# Patient Record
Sex: Male | Born: 1960 | Race: Black or African American | Hispanic: No | Marital: Single | Smoking: Never smoker
Health system: Southern US, Community
[De-identification: ages and names within clinical notes are randomized; demographics above are authoritative.]

## PROBLEM LIST (undated history)

## (undated) DIAGNOSIS — M1990 Unspecified osteoarthritis, unspecified site: Secondary | ICD-10-CM

## (undated) DIAGNOSIS — J3489 Other specified disorders of nose and nasal sinuses: Secondary | ICD-10-CM

## (undated) DIAGNOSIS — G7000 Myasthenia gravis without (acute) exacerbation: Secondary | ICD-10-CM

## (undated) DIAGNOSIS — E785 Hyperlipidemia, unspecified: Secondary | ICD-10-CM

## (undated) DIAGNOSIS — I10 Essential (primary) hypertension: Secondary | ICD-10-CM

## (undated) DIAGNOSIS — A630 Anogenital (venereal) warts: Secondary | ICD-10-CM

## (undated) DIAGNOSIS — Z972 Presence of dental prosthetic device (complete) (partial): Secondary | ICD-10-CM

## (undated) HISTORY — DX: Myasthenia gravis without (acute) exacerbation: G70.00

## (undated) HISTORY — DX: Hyperlipidemia, unspecified: E78.5

---

## 1984-02-14 HISTORY — PX: LUMBAR SPINE SURGERY: SHX701

## 1997-05-14 ENCOUNTER — Encounter: Admission: RE | Admit: 1997-05-14 | Discharge: 1997-08-12 | Payer: Self-pay | Admitting: Orthopedic Surgery

## 1998-04-26 ENCOUNTER — Encounter: Payer: Self-pay | Admitting: Internal Medicine

## 1998-04-26 ENCOUNTER — Ambulatory Visit (HOSPITAL_COMMUNITY): Admission: RE | Admit: 1998-04-26 | Discharge: 1998-04-26 | Payer: Self-pay | Admitting: Internal Medicine

## 1998-10-07 ENCOUNTER — Emergency Department (HOSPITAL_COMMUNITY): Admission: EM | Admit: 1998-10-07 | Discharge: 1998-10-08 | Payer: Self-pay | Admitting: Emergency Medicine

## 2002-10-22 ENCOUNTER — Ambulatory Visit (HOSPITAL_COMMUNITY): Admission: RE | Admit: 2002-10-22 | Discharge: 2002-10-22 | Payer: Self-pay | Admitting: Ophthalmology

## 2002-10-22 ENCOUNTER — Encounter: Payer: Self-pay | Admitting: Ophthalmology

## 2002-10-28 ENCOUNTER — Encounter: Payer: Self-pay | Admitting: Internal Medicine

## 2002-10-28 ENCOUNTER — Ambulatory Visit (HOSPITAL_COMMUNITY): Admission: RE | Admit: 2002-10-28 | Discharge: 2002-10-28 | Payer: Self-pay | Admitting: Internal Medicine

## 2002-11-21 ENCOUNTER — Encounter: Payer: Self-pay | Admitting: Emergency Medicine

## 2002-11-21 ENCOUNTER — Emergency Department (HOSPITAL_COMMUNITY): Admission: EM | Admit: 2002-11-21 | Discharge: 2002-11-21 | Payer: Self-pay | Admitting: Emergency Medicine

## 2003-01-02 ENCOUNTER — Emergency Department (HOSPITAL_COMMUNITY): Admission: EM | Admit: 2003-01-02 | Discharge: 2003-01-03 | Payer: Self-pay | Admitting: Emergency Medicine

## 2004-09-21 ENCOUNTER — Ambulatory Visit: Payer: Self-pay | Admitting: Internal Medicine

## 2005-06-21 ENCOUNTER — Ambulatory Visit: Payer: Self-pay | Admitting: Internal Medicine

## 2006-02-02 ENCOUNTER — Ambulatory Visit: Payer: Self-pay | Admitting: Internal Medicine

## 2006-09-04 ENCOUNTER — Ambulatory Visit: Payer: Self-pay | Admitting: Internal Medicine

## 2006-09-04 DIAGNOSIS — I10 Essential (primary) hypertension: Secondary | ICD-10-CM | POA: Insufficient documentation

## 2006-09-04 DIAGNOSIS — N529 Male erectile dysfunction, unspecified: Secondary | ICD-10-CM | POA: Insufficient documentation

## 2006-09-04 LAB — CONVERTED CEMR LAB: TSH: 1.16 microintl units/mL (ref 0.35–5.50)

## 2006-10-25 ENCOUNTER — Emergency Department (HOSPITAL_COMMUNITY): Admission: EM | Admit: 2006-10-25 | Discharge: 2006-10-25 | Payer: Self-pay | Admitting: Emergency Medicine

## 2006-12-21 ENCOUNTER — Encounter: Payer: Self-pay | Admitting: Internal Medicine

## 2007-03-01 ENCOUNTER — Encounter: Payer: Self-pay | Admitting: Internal Medicine

## 2007-03-08 ENCOUNTER — Encounter: Payer: Self-pay | Admitting: Internal Medicine

## 2007-03-29 ENCOUNTER — Encounter: Payer: Self-pay | Admitting: Internal Medicine

## 2007-05-16 ENCOUNTER — Ambulatory Visit: Payer: Self-pay | Admitting: Internal Medicine

## 2007-05-16 DIAGNOSIS — E0822 Diabetes mellitus due to underlying condition with diabetic chronic kidney disease: Secondary | ICD-10-CM | POA: Insufficient documentation

## 2007-05-16 DIAGNOSIS — E119 Type 2 diabetes mellitus without complications: Secondary | ICD-10-CM | POA: Insufficient documentation

## 2007-05-16 LAB — CONVERTED CEMR LAB
Basophils Absolute: 0 10*3/uL (ref 0.0–0.1)
Bilirubin Urine: NEGATIVE
Bilirubin, Direct: 0.1 mg/dL (ref 0.0–0.3)
Calcium: 9.4 mg/dL (ref 8.4–10.5)
Eosinophils Absolute: 0 10*3/uL (ref 0.0–0.7)
GFR calc Af Amer: 76 mL/min
GFR calc non Af Amer: 63 mL/min
Glucose, Bld: 104 mg/dL — ABNORMAL HIGH (ref 70–99)
HCT: 37.9 % — ABNORMAL LOW (ref 39.0–52.0)
HDL: 99.1 mg/dL (ref >39.0)
Hemoglobin: 12.5 g/dL — ABNORMAL LOW (ref 13.0–17.0)
Ketones, ur: NEGATIVE mg/dL
MCHC: 32.9 g/dL (ref 30.0–36.0)
MCV: 91.8 fL (ref 78.0–100.0)
Microalb Creat Ratio: 36.8 mg/g — ABNORMAL HIGH (ref 0.0–30.0)
Microalb, Ur: 1.9 mg/dL (ref 0.0–1.9)
Monocytes Absolute: 0.3 10*3/uL (ref 0.1–1.0)
Neutro Abs: 5.6 10*3/uL (ref 1.4–7.7)
RDW: 15.1 % — ABNORMAL HIGH (ref 11.5–14.6)
Sodium: 141 meq/L (ref 135–145)
TSH: 0.35 microintl units/mL (ref 0.35–5.50)
Total Bilirubin: 0.7 mg/dL (ref 0.3–1.2)
Total Protein, Urine: NEGATIVE mg/dL
Total Protein: 6.4 g/dL (ref 6.0–8.3)
Triglycerides: 55 mg/dL (ref 0–149)
VLDL: 11 mg/dL (ref 0–40)
pH: 6 (ref 5.0–8.0)

## 2007-05-31 ENCOUNTER — Encounter: Payer: Self-pay | Admitting: Internal Medicine

## 2007-07-26 ENCOUNTER — Encounter: Payer: Self-pay | Admitting: Internal Medicine

## 2007-08-01 ENCOUNTER — Telehealth: Payer: Self-pay | Admitting: Internal Medicine

## 2007-08-20 ENCOUNTER — Encounter: Admission: RE | Admit: 2007-08-20 | Discharge: 2007-08-20 | Payer: Self-pay | Admitting: Internal Medicine

## 2007-08-20 ENCOUNTER — Encounter: Payer: Self-pay | Admitting: Internal Medicine

## 2007-10-08 ENCOUNTER — Encounter: Payer: Self-pay | Admitting: Internal Medicine

## 2007-11-29 ENCOUNTER — Encounter: Payer: Self-pay | Admitting: Internal Medicine

## 2008-01-06 ENCOUNTER — Ambulatory Visit: Payer: Self-pay | Admitting: Internal Medicine

## 2008-01-06 LAB — CONVERTED CEMR LAB
GFR calc Af Amer: 64 mL/min
GFR calc non Af Amer: 53 mL/min
Glucose, Bld: 106 mg/dL — ABNORMAL HIGH (ref 70–99)
LDL Cholesterol: 89 mg/dL (ref 0–99)
Potassium: 4 meq/L (ref 3.5–5.1)
Sodium: 142 meq/L (ref 135–145)
VLDL: 15 mg/dL (ref 0–40)

## 2008-03-13 ENCOUNTER — Encounter: Payer: Self-pay | Admitting: Internal Medicine

## 2008-05-14 ENCOUNTER — Encounter: Payer: Self-pay | Admitting: Internal Medicine

## 2008-06-02 ENCOUNTER — Encounter: Payer: Self-pay | Admitting: Internal Medicine

## 2008-07-02 ENCOUNTER — Ambulatory Visit: Payer: Self-pay | Admitting: Internal Medicine

## 2008-07-02 LAB — CONVERTED CEMR LAB
BUN: 13 mg/dL (ref 6–23)
Bilirubin Urine: NEGATIVE
Bilirubin, Direct: 0.1 mg/dL (ref 0.0–0.3)
CO2: 28 meq/L (ref 19–32)
Chloride: 103 meq/L (ref 96–112)
Cholesterol: 184 mg/dL (ref 0–200)
Creatinine, Ser: 1.4 mg/dL (ref 0.4–1.5)
Eosinophils Absolute: 0.1 10*3/uL (ref 0.0–0.7)
Glucose, Bld: 110 mg/dL — ABNORMAL HIGH (ref 70–99)
Hemoglobin, Urine: NEGATIVE
Hgb A1c MFr Bld: 6.2 % (ref 4.6–6.5)
Ketones, ur: NEGATIVE mg/dL
MCHC: 35.3 g/dL (ref 30.0–36.0)
MCV: 91.2 fL (ref 78.0–100.0)
Microalb Creat Ratio: 3.4 mg/g (ref 0.0–30.0)
Microalb, Ur: 0.2 mg/dL (ref 0.0–1.9)
Monocytes Absolute: 0.5 10*3/uL (ref 0.1–1.0)
Neutrophils Relative %: 39.5 % — ABNORMAL LOW (ref 43.0–77.0)
Nitrite: NEGATIVE
Platelets: 251 10*3/uL (ref 150.0–400.0)
TSH: 0.8 microintl units/mL (ref 0.35–5.50)
Total Bilirubin: 0.6 mg/dL (ref 0.3–1.2)
Total Protein, Urine: NEGATIVE mg/dL
Total Protein: 6.4 g/dL (ref 6.0–8.3)
Triglycerides: 64 mg/dL (ref 0.0–149.0)

## 2008-07-06 ENCOUNTER — Ambulatory Visit: Payer: Self-pay | Admitting: Internal Medicine

## 2008-09-01 ENCOUNTER — Encounter: Payer: Self-pay | Admitting: Internal Medicine

## 2008-12-03 ENCOUNTER — Encounter: Payer: Self-pay | Admitting: Internal Medicine

## 2008-12-11 ENCOUNTER — Encounter: Payer: Self-pay | Admitting: Internal Medicine

## 2009-01-04 ENCOUNTER — Ambulatory Visit: Payer: Self-pay | Admitting: Internal Medicine

## 2009-03-02 ENCOUNTER — Encounter: Payer: Self-pay | Admitting: Internal Medicine

## 2009-03-22 ENCOUNTER — Ambulatory Visit: Payer: Self-pay | Admitting: Internal Medicine

## 2009-03-22 ENCOUNTER — Encounter (INDEPENDENT_AMBULATORY_CARE_PROVIDER_SITE_OTHER): Payer: Self-pay | Admitting: *Deleted

## 2009-03-30 ENCOUNTER — Encounter: Payer: Self-pay | Admitting: Internal Medicine

## 2009-07-05 ENCOUNTER — Ambulatory Visit: Payer: Self-pay | Admitting: Internal Medicine

## 2009-07-05 LAB — CONVERTED CEMR LAB
ALT: 15 units/L (ref 0–53)
AST: 18 units/L (ref 0–37)
Albumin: 4 g/dL (ref 3.5–5.2)
Alkaline Phosphatase: 40 units/L (ref 39–117)
BUN: 19 mg/dL (ref 6–23)
Basophils Relative: 0.6 % (ref 0.0–3.0)
CO2: 31 meq/L (ref 19–32)
Chloride: 105 meq/L (ref 96–112)
Creatinine, Ser: 1.3 mg/dL (ref 0.4–1.5)
Eosinophils Relative: 1.7 % (ref 0.0–5.0)
GFR calc non Af Amer: 77.69 mL/min (ref >60)
Glucose, Bld: 100 mg/dL — ABNORMAL HIGH (ref 70–99)
Hemoglobin: 13.5 g/dL (ref 13.0–17.0)
Ketones, ur: NEGATIVE mg/dL
Leukocytes, UA: NEGATIVE
Lymphocytes Relative: 47.4 % — ABNORMAL HIGH (ref 12.0–46.0)
Microalb, Ur: 1 mg/dL (ref 0.0–1.9)
Monocytes Relative: 9.3 % (ref 3.0–12.0)
Neutro Abs: 1.6 10*3/uL (ref 1.4–7.7)
PSA: 1.12 ng/mL (ref 0.10–4.00)
RBC: 4.21 M/uL — ABNORMAL LOW (ref 4.22–5.81)
Specific Gravity, Urine: >=1.03 (ref 1.000–1.030)
TSH: 0.9 microintl units/mL (ref 0.35–5.50)
Total Protein: 6.5 g/dL (ref 6.0–8.3)
Urobilinogen, UA: 0.2 (ref 0.0–1.0)
VLDL: 15 mg/dL (ref 0.0–40.0)

## 2009-09-09 ENCOUNTER — Encounter: Payer: Self-pay | Admitting: Internal Medicine

## 2009-09-17 ENCOUNTER — Encounter: Payer: Self-pay | Admitting: Internal Medicine

## 2009-11-03 ENCOUNTER — Encounter: Admission: RE | Admit: 2009-11-03 | Discharge: 2009-11-03 | Payer: Self-pay | Admitting: General Surgery

## 2009-11-04 ENCOUNTER — Ambulatory Visit (HOSPITAL_BASED_OUTPATIENT_CLINIC_OR_DEPARTMENT_OTHER): Admission: RE | Admit: 2009-11-04 | Discharge: 2009-11-04 | Payer: Self-pay | Admitting: General Surgery

## 2009-11-04 HISTORY — PX: LASER ABLATION OF CONDYLOMAS: SHX1948

## 2009-11-11 ENCOUNTER — Ambulatory Visit: Payer: Self-pay | Admitting: Internal Medicine

## 2009-11-11 DIAGNOSIS — N259 Disorder resulting from impaired renal tubular function, unspecified: Secondary | ICD-10-CM | POA: Insufficient documentation

## 2009-11-11 LAB — CONVERTED CEMR LAB
BUN: 16 mg/dL (ref 6–23)
GFR calc non Af Amer: 62.01 mL/min (ref >60)
Glucose, Bld: 171 mg/dL — ABNORMAL HIGH (ref 70–99)
Potassium: 4.8 meq/L (ref 3.5–5.1)

## 2009-11-12 ENCOUNTER — Telehealth: Payer: Self-pay | Admitting: Internal Medicine

## 2010-01-26 ENCOUNTER — Encounter: Payer: Self-pay | Admitting: Internal Medicine

## 2010-03-15 NOTE — Letter (Signed)
Summary: [facility]  [facility]   Imported By: Lester O[place]ogan 09/16/2009 10:24:13  _____________________________________________________________________  External Attachment:    Type:   Image     Comment:   External Document

## 2010-03-15 NOTE — Assessment & Plan Note (Signed)
Summary: wax build up--stc   Vital Signs:  Patient profile:   50 year old male Height:      71 inches Weight:      248.25 pounds BMI:     34.75 O2 Sat:      95 % on Room air Temp:     98.8 degrees F oral Pulse rate:   82 / minute BP sitting:   122 / 68  (left arm) Cuff size:   large  Vitals Entered By: Zella Ball Ewing CMA Duncan Dull) (November 11, 2009 2:27 PM)  O2 Flow:  Room air CC: Wax build up in both ears/RE   CC:  Wax build up in both ears/RE.  History of Present Illness: here for acute - c/o 1-2 days acute onset bialt hearing loss, left more than right, wtihout fever, earpain, heacahde, sT, cough adn Pt denies CP, worsening sob, doe, wheezing, orthopnea, pnd, worsening LE edema, palps, dizziness or syncope Pt denies new neuro symptoms such as headache, facial or extremity weakness , but does have ongoing bialt CTS symtpoms persitent and has not yet been able to get wrist splints as recommended previously;  pain now mild to mod, has been better with splints int he past, no weakness, upper arm or neck pain, and does not want surgical evaluation at this time.  Did see neuro at [facility], who ordered CXR for cough adn arranged [facility] which [facility] - [facility]cart results reviewed with pt today - cxr with nad, and cbc, cmet ok except for unexaplianed elev creatinine from previous 1.3 to 1.6, no recetn med changes, and denies GU symtpoms such as dysuria, freq, urgency, hematuria, or flank pain. Cough incidetnly for approx 2 mo, and some improved spontaneously but still persitent ,  non prod, no hemoptysis, and No fever, wt loss, night sweats, loss of appetite or other constitutional symptoms Has occasional reflux mild but daily without dysphagia, n/v, abd pain, bowel change or blood.  Also with mild nasal allergy symptoms persistent without fever, pain.   Preventive Screening-Counseling & Management      Drug Use:  no.    Problems Prior to Update: 1)  Renal Insufficiency   (ICD-588.9) 2)  Cough  (ICD-786.2) 3)  Other Specified Forms of Hearing Loss  (ICD-389.8) 4)  Preventive Health Care  (ICD-V70.0) 5)  Chalazion, Right  (ICD-373.2) 6)  Long-term Use of Steroids  (ICD-V58.65) 7)  Anxiety  (ICD-300.00) 8)  Diabetes Mellitus, Type II  (ICD-250.00) 9)  Preventive Health Care  (ICD-V70.0) 10)  Myasthenia Gravis Without Exacerbation  (ICD-358.00) 11)  Allergic Rhinitis  (ICD-477.9) 12)  Overweight  (ICD-278.02) 13)  Carpal Tunnel Syndrome, Bilateral  (ICD-354.0) 14)  Erectile Dysfunction  (ICD-302.72) 15)  Hypertension  (ICD-401.9)  Medications Prior to Update: 1)  Benicar 20 Mg Tabs (Olmesartan Medoxomil) .Marland Kitchen.. 1po Qd 2)  Imuran 50 Mg  Tabs (Azathioprine) .... Take 3 Tabs By Mouth Once Daily 3)  Prednisone 20 Mg  Tabs (Prednisone) .Marland Kitchen.. 1po Once Daily 4)  Mestinon 60 Mg  Tabs (Pyridostigmine Bromide) .Marland Kitchen.. 1 By Mouth Tid 5)  Hydrochlorothiazide 12.5 Mg  Tabs (Hydrochlorothiazide) .Marland Kitchen.. 1 By Mouth Qd 6)  Oscal W/ Vit D .... 1po Qd 7)  Vitamin A-Beta Carotene 16109 Unit Caps (Beta Carotene) .Marland Kitchen.. 1 By Mouth Qd 8)  Vitamin C 500 Mg Chew (Ascorbic Acid) .Marland Kitchen.. 1 By Mouth Bid 9)  Vitamin E 200 Unit Caps (Vitamin E) .Marland Kitchen.. 1 By Mouth Qd 10)  Azithromycin 250 Mg Tabs (  Azithromycin) .... 2po Qd For 1 Day, Then 1po Qd For 4days, Then Stop 11)  Hydrocodone-Homatropine 5-1.5 Mg/14ml Syrp (Hydrocodone-Homatropine) .Marland Kitchen.. 1 Tsp By Mouth Q 6 Hrs As Needed 12)  Fluticasone Propionate 50 Mcg/act Susp (Fluticasone Propionate) .... 2 Sprayside Once Daily  Current Medications (verified): 1)  Benicar 20 Mg Tabs (Olmesartan Medoxomil) .Marland Kitchen.. 1po Qd 2)  Imuran 50 Mg  Tabs (Azathioprine) .... Take 3 Tabs By Mouth Once Daily 3)  Prednisone 20 Mg  Tabs (Prednisone) .Marland Kitchen.. 1po Once Daily 4)  Mestinon 60 Mg  Tabs (Pyridostigmine Bromide) .Marland Kitchen.. 1 By Mouth Tid 5)  Hydrochlorothiazide 12.5 Mg  Tabs (Hydrochlorothiazide) .Marland Kitchen.. 1 By Mouth Qd 6)  Oscal W/ Vit D .... 1po Qd 7)  Vitamin A-Beta Carotene  60454 Unit Caps (Beta Carotene) .Marland Kitchen.. 1 By Mouth Qd 8)  Vitamin C 500 Mg Chew (Ascorbic Acid) .Marland Kitchen.. 1 By Mouth Bid 9)  Vitamin E 200 Unit Caps (Vitamin E) .Marland Kitchen.. 1 By Mouth Qd 10)  Fluticasone Propionate 50 Mcg/act Susp (Fluticasone Propionate) .... 2 Sprayside Once Daily 11)  Bilateral Wrist Splints - Xtra Large .... 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 Never Smoked Alcohol use-yes - social Divorced Drug use-no Drug Use:  no  Review of Systems       all otherwise negative per pt -    Physical Exam  General:  alert and overweight-appearing.   Head:  normocephalic and atraumatic.   Eyes:  vision grossly intact, pupils equal, and pupils round.   Ears:  R ear normal and L ear normal.  after bilat wax impactions irrigated out Nose:  nasal dischargemucosal pallor and mucosal edema.   Mouth:  pharyngeal erythema and fair dentition.   Neck:  supple and no masses.   Lungs:  normal respiratory effort and normal breath sounds.   Heart:  normal rate and regular rhythm.   Abdomen:  soft, non-tender, and normal bowel sounds.   Msk:  no joint tenderness and no joint swelling.   Extremities:  no edema, no erythema  Neurologic:  cranial nerves II-XII intact and strength normal in all extremities.     Impression & Recommendations:  Problem # 1:  OTHER SPECIFIED FORMS OF HEARING LOSS (ICD-389.8) due to wax - ok for irrigation bilat today, and  improved  Problem # 2:  CARPAL TUNNEL SYNDROME, BILATERAL (ICD-354.0) for bialt wrist splints today, pt to f/u emg/ncs recetnly done (results pending per pt) with  neurology next visit planned  Problem # 3:  COUGH (ICD-786.2) recent cxr neg sept 20;  not taking the flonase, but not really wanting to at this time;  ok for sample dexilant 60 - to take two times a day for now to see if improved over the next wk or so;  also for OTC allegra as needed   Problem # 4:  RENAL INSUFFICIENCY (ICD-588.9)  ? significacne  - indicently with new cr 1.6 , up from 1.15 Jun 2009 in comparison to labs per [facility] neuro done at [facility] and reviewed with pt from echart - to repeat today to  further evaluate , ok to cont benicar at this time as doubtful this is related  Orders: TLB-BMP (Basic Metabolic Panel-BMET) (80048-METABOL)  Complete Medication List: 1)  Benicar 20 Mg Tabs (Olmesartan medoxomil) .Marland Kitchen.. 1po qd 2)  Imuran 50 Mg Tabs (Azathioprine) .... Take 3 tabs by mouth once daily 3)  Prednisone 20 Mg Tabs (Prednisone) .Marland Kitchen.. 1po once daily 4)  Mestinon 60 Mg Tabs (Pyridostigmine bromide) .Marland Kitchen.. 1 by mouth tid 5)  Hydrochlorothiazide 12.5 Mg Tabs (Hydrochlorothiazide) .Marland Kitchen.. 1 by mouth qd 6)  Oscal W/ Vit D  .... 1po qd 7)  Vitamin A-beta Carotene 16109 Unit Caps (Beta carotene) .Marland Kitchen.. 1 by mouth qd 8)  Vitamin C 500 Mg Chew (Ascorbic acid) .Marland Kitchen.. 1 by mouth bid 9)  Vitamin E 200 Unit Caps (Vitamin e) .Marland Kitchen.. 1 by mouth qd 10)  Fluticasone Propionate 50 Mcg/act Susp (Fluticasone propionate) .... 2 sprayside once daily 11)  Bilateral Wrist Splints - Xtra Large  .... Use asd  Patient Instructions: 1)  please use the wrist splints at night and followup symptoms with the neurologist at your next visit 2)  your ears were irrigated today 3)  please try the dexilant 60 mg twice per day until the samples run out to see if this helps the cough;  if it does you may have acid reflux that is irritating the throat and you should take  OTC prilosec 20 mg (1-2 per day) for this;  if it does not seem to help please try OTC allegra to see if allergies could be present and causing the cough;  if your cough persists, you may need to be referred to pulmonary 4)  Please go to the Lab in the basement for your blood and/or urine tests today 5)  Please call the number on the [facility] for results of your testing  6)  Please schedule a follow-up appointment in May 2012 with CPX labs (or sooner if needed) Prescriptions: BILATERAL WRIST SPLINTS - XTRA LARGE use asd  #1pr x 1   Entered and Authorized by:   Corwin Levins MD   Signed by:   Corwin Levins MD on 11/11/2009   Method used:   Print then Give to Patient   RxID:   [id number]

## 2010-03-15 NOTE — Letter (Signed)
Summary: Neurology/[facility]  Neurology/[facility]   Imported By: Sherian Rein 09/29/2009 15:08:19  _____________________________________________________________________  External Attachment:    Type:   Image     Comment:   External Document

## 2010-03-15 NOTE — Progress Notes (Signed)
Summary: RET CALL   Phone Note Call from Patient Call back at Home Phone [phone] Call back at [phone]   Summary of Call: Pt left vm on triage, he said he was returning Robin's call regarding a wrist splint.  Initial call taken by: Lamar Sprinkles, CMA,  November 12, 2009 10:12 AM  Follow-up for Phone Call        called pt informed to pickup wrist splint order at front desk. Follow-up by: Zella Ball Ewing CMA (AAMA),  November 12, 2009 10:30 AM

## 2010-03-15 NOTE — Assessment & Plan Note (Signed)
Summary: 6 MTH FU  STC   Vital Signs:  Patient profile:   50 year old male Height:      71 inches Weight:      240.13 pounds BMI:     33.61 O2 Sat:      96 % on Room air Temp:     99.3 degrees F oral Pulse rate:   69 / minute BP sitting:   110 / 82  (left arm) Cuff size:   large  Vitals Entered ByZella Ball Ewing (Jul 05, 2009 11:12 AM)  O2 Flow:  Room air  CC: 6 month followup/RE   CC:  6 month followup/RE.  History of Present Illness: off metformin  per endo ;  overall doing well, has mild to mod nasal allergy symtpoms with congestion with clear drainage and occasional cough;  Pt denies CP, sob, doe, wheezing, orthopnea, pnd, worsening LE edema, palps, dizziness or syncope   Pt denies new neuro symptoms such as headache, facial or extremity weakness     Problems Prior to Update: 1)  Preventive Health Care  (ICD-V70.0) 2)  Chalazion, Right  (ICD-373.2) 3)  Long-term Use of Steroids  (ICD-V58.65) 4)  Anxiety  (ICD-300.00) 5)  Diabetes Mellitus, Type II  (ICD-250.00) 6)  Preventive Health Care  (ICD-V70.0) 7)  Myasthenia Gravis Without Exacerbation  (ICD-358.00) 8)  Allergic Rhinitis  (ICD-477.9) 9)  Overweight  (ICD-278.02) 10)  Carpal Tunnel Syndrome, Bilateral  (ICD-354.0) 11)  Erectile Dysfunction  (ICD-302.72) 12)  Hypertension  (ICD-401.9)  Medications Prior to Update: 1)  Benicar 20 Mg Tabs (Olmesartan Medoxomil) .Marland Kitchen.. 1po Qd 2)  Imuran 50 Mg  Tabs (Azathioprine) .... Take 3 Tabs By Mouth Once Daily 3)  Metformin Hcl 500 Mg Tabs (Metformin Hcl) .Marland Kitchen.. 1 By Mouth Once Daily 4)  Prednisone 20 Mg  Tabs (Prednisone) .Marland Kitchen.. 1po Once Daily 5)  Mestinon 60 Mg  Tabs (Pyridostigmine Bromide) .Marland Kitchen.. 1 By Mouth Tid 6)  Hydrochlorothiazide 12.5 Mg  Tabs (Hydrochlorothiazide) .Marland Kitchen.. 1 By Mouth Qd 7)  Oscal W/ Vit D .... 1po Qd 8)  Vitamin A-Beta Carotene 16109 Unit Caps (Beta Carotene) .Marland Kitchen.. 1 By Mouth Qd 9)  Vitamin C 500 Mg Chew (Ascorbic Acid) .Marland Kitchen.. 1 By Mouth Bid 10)  Vitamin E 200  Unit Caps (Vitamin E) .Marland Kitchen.. 1 By Mouth Qd 11)  Azithromycin 250 Mg Tabs (Azithromycin) .... 2po Qd For 1 Day, Then 1po Qd For 4days, Then Stop 12)  Hydrocodone-Homatropine 5-1.5 Mg/62ml Syrp (Hydrocodone-Homatropine) .Marland Kitchen.. 1 Tsp By Mouth Q 6 Hrs As Needed  Current Medications (verified): 1)  Benicar 20 Mg Tabs (Olmesartan Medoxomil) .Marland Kitchen.. 1po Qd 2)  Imuran 50 Mg  Tabs (Azathioprine) .... Take 3 Tabs By Mouth Once Daily 3)  Prednisone 20 Mg  Tabs (Prednisone) .Marland Kitchen.. 1po Once Daily 4)  Mestinon 60 Mg  Tabs (Pyridostigmine Bromide) .Marland Kitchen.. 1 By Mouth Tid 5)  Hydrochlorothiazide 12.5 Mg  Tabs (Hydrochlorothiazide) .Marland Kitchen.. 1 By Mouth Qd 6)  Oscal W/ Vit D .... 1po Qd 7)  Vitamin A-Beta Carotene 60454 Unit Caps (Beta Carotene) .Marland Kitchen.. 1 By Mouth Qd 8)  Vitamin C 500 Mg Chew (Ascorbic Acid) .Marland Kitchen.. 1 By Mouth Bid 9)  Vitamin E 200 Unit Caps (Vitamin E) .Marland Kitchen.. 1 By Mouth Qd 10)  Azithromycin 250 Mg Tabs (Azithromycin) .... 2po Qd For 1 Day, Then 1po Qd For 4days, Then Stop 11)  Hydrocodone-Homatropine 5-1.5 Mg/73ml Syrp (Hydrocodone-Homatropine) .Marland Kitchen.. 1 Tsp By Mouth Q 6 Hrs As Needed 12)  Fluticasone Propionate 50  Mcg/act Susp (Fluticasone Propionate) .... 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 brother with ruptured aneurysm at 60 yo mother and sister with DM sister with pancreatic cancer HTN brother died with sepsis at 50 07-17-06  Review of Systems  The patient denies anorexia, fever, vision loss, decreased hearing, hoarseness, chest pain, syncope, dyspnea on exertion, peripheral edema, prolonged cough, headaches, hemoptysis, abdominal pain, melena, hematochezia, severe indigestion/heartburn, hematuria, muscle weakness, suspicious skin lesions, difficulty walking, depression, unusual weight change, abnormal bleeding, enlarged lymph nodes, and angioedema.         all otherwise negative per pt -    Physical Exam  General:  alert and overweight-appearing.   Head:  normocephalic and atraumatic.   Eyes:  vision grossly intact, pupils equal, and pupils round.   Ears:  R ear normal and L ear normal.   Nose:  nasal dischargemucosal pallor and mucosal edema.   Mouth:  no gingival abnormalities and pharynx pink and moist.   Neck:  supple and no masses.   Lungs:  normal respiratory effort and normal breath sounds.   Heart:  normal rate and regular rhythm.   Abdomen:  soft, non-tender, and normal bowel sounds.   Msk:  no joint tenderness and no joint swelling.   Extremities:  no edema, no erythema  Neurologic:  cranial nerves II-XII intact and strength normal in all extremities.   Skin:  color normal and no rashes.   Psych:  not anxious appearing and not depressed appearing.     Impression & Recommendations:  Problem # 1:  Preventive Health Care (ICD-V70.0)  Overall doing well, age appropriate education and counseling updated and referral for appropriate preventive services done unless declined, immunizations up to date or declined, diet counseling done if overweight, urged to quit smoking if smokes , most recent labs reviewed and current ordered if appropriate, ecg reviewed or declined (interpretation per ECG scanned in the EMR if done); information regarding Medicare  Prevention requirements given if appropriate; speciality referrals updated as appropriate   Orders: Gastroenterology Referral (GI) TLB-BMP (Basic Metabolic Panel-BMET) (80048-METABOL) TLB-CBC Platelet - w/Differential (85025-CBCD) TLB-Hepatic/Liver Function Pnl (80076-HEPATIC) TLB-Lipid Panel (80061-LIPID) TLB-TSH (Thyroid Stimulating Hormone) (84443-TSH) TLB-PSA (Prostate Specific Antigen) (84153-PSA) TLB-Udip ONLY (81003-UDIP)  Problem # 2:  ALLERGIC RHINITIS (ICD-477.9)  His updated medication list for this problem includes:    Fluticasone Propionate 50 Mcg/act Susp (Fluticasone propionate) .Marland Kitchen... 2 sprayside once daily  Discussed use of allergy medications and environmental measures.  treat as above, f/u any worsening signs or symptoms   Problem # 3:  DIABETES MELLITUS, TYPE II (ICD-250.00)  The following medications were removed from the medication list:    Metformin Hcl 500 Mg Tabs (Metformin hcl) .Marland Kitchen... 1 by mouth once daily His updated medication list for this problem includes:    Benicar 20 Mg Tabs (Olmesartan medoxomil) .Marland Kitchen... 1po qd  Labs Reviewed: Creat: 1.4 (07/02/2008)  Reviewed HgBA1c results: 6.2 (07/02/2008)  6.4 (01/06/2008) recently off metformin due to good sugars per endo - Pt to cont DM diet, excercise, wt loss efforts; to check labs today   Orders: TLB-A1C / Hgb A1C (Glycohemoglobin) (83036-A1C) TLB-Microalbumin/Creat Ratio, Urine (82043-MALB)  Problem # 4:  HYPERTENSION (ICD-401.9)  His updated medication list for this problem includes:    Benicar 20 Mg Tabs (Olmesartan medoxomil) .Marland Kitchen... 1po qd    Hydrochlorothiazide 12.5 Mg Tabs (Hydrochlorothiazide) .Marland Kitchen... 1 by mouth qd  BP today: 110/82 Prior BP: 126/72 (03/22/2009)  Labs Reviewed: K+: 3.8 (07/02/2008) Creat: : 1.4 (07/02/2008)   Chol: 184 (07/02/2008)   HDL: 56.00 (07/02/2008)   LDL: 115 (07/02/2008)   TG: 64.0 (07/02/2008) stable overall by hx and exam, ok to continue meds/tx as is    Complete Medication List: 1)  Benicar 20 Mg Tabs (Olmesartan medoxomil) .Marland Kitchen.. 1po qd 2)  Imuran 50 Mg Tabs (Azathioprine) .... Take 3 tabs by mouth once daily 3)  Prednisone 20 Mg Tabs (Prednisone) .Marland Kitchen.. 1po once daily 4)  Mestinon 60 Mg Tabs (Pyridostigmine bromide) .Marland Kitchen.. 1 by mouth tid 5)  Hydrochlorothiazide 12.5 Mg Tabs (Hydrochlorothiazide) .Marland Kitchen.. 1 by mouth qd 6)  Oscal W/ Vit D  .... 1po qd 7)  Vitamin A-beta Carotene 98119 Unit Caps (Beta carotene) .Marland Kitchen.. 1 by mouth qd 8)  Vitamin C 500 Mg Chew (Ascorbic acid) .Marland Kitchen.. 1 by mouth bid 9)  Vitamin E 200 Unit Caps (Vitamin e) .Marland Kitchen.. 1 by mouth qd 10)  Azithromycin 250 Mg Tabs (Azithromycin) .... 2po qd for 1 day, then 1po qd for 4days, then stop 11)  Hydrocodone-homatropine 5-1.5 Mg/88ml Syrp (Hydrocodone-homatropine) .Marland Kitchen.. 1 tsp by mouth q 6 hrs as needed 12)  Fluticasone Propionate 50 Mcg/act Susp (Fluticasone propionate) .... 2 sprayside once daily  Patient Instructions: 1)  Please take all new medications as prescribed - the nasal spray 2)  Continue all previous medications as before this visit  3)  Please go to the Lab in the basement for your blood and/or urine tests today  4)  You will be contacted about the referral(s) to: colonoscopy 5)  Please schedule a follow-up appointment in 1 year or sooner if needed Prescriptions: FLUTICASONE PROPIONATE 50 MCG/ACT SUSP (FLUTICASONE PROPIONATE) 2 sprayside once daily  #1 x 11   Entered and Authorized by:   Corwin Levins MD   Signed by:   Corwin Levins MD on 07/05/2009   Method used:   Print then Give to Patient   RxID:   [id number]

## 2010-03-15 NOTE — Letter (Signed)
Summary: CMN for compression garments/[facility]  CMN for compression garments/[facility]   Imported By: Sherian Rein 03/03/2009 11:28:16  _____________________________________________________________________  External Attachment:    Type:   Image     Comment:   External Document

## 2010-03-15 NOTE — Letter (Signed)
Summary: Out of Work  [facility]  [address] [place], [place] [address]   Phone: [phone]  Fax: [phone]    March 22, 2009   Employee:  Miguel Powell    To Whom It May Concern:   For Medical reasons, please excuse the above named employee from work for the following dates:  Start:   03/22/2009  End:   03/22/2009  If you need additional information, please feel free to contact our office.         Sincerely,    Robin Ewing

## 2010-03-15 NOTE — Letter (Signed)
Summary: Neurology/[facility]  Neurology/[facility]   Imported By: Sherian Rein 04/08/2009 09:49:51  _____________________________________________________________________  External Attachment:    Type:   Image     Comment:   External Document

## 2010-03-15 NOTE — Assessment & Plan Note (Signed)
Summary: PER Miguel Powell--COLD SYMPTOMS PER PT D/T--STC   Vital Signs:  Patient profile:   50 year old male Height:      71 inches Weight:      234 pounds BMI:     32.75 O2 Sat:      98 % on Room air Temp:     98 degrees F oral Pulse rate:   72 / minute BP sitting:   126 / 72  (left arm) Cuff size:   regular  Vitals Entered ByZella Ball Ewing (March 22, 2009 10:40 AM)  O2 Flow:  Room air CC: chest congestion and cough/RE   CC:  chest congestion and cough/RE.  History of Present Illness: here with 3 days onset severe ST, prod cough greenish sputum, with general weakness and malaise, but Pt denies CP, sob, doe, wheezing, orthopnea, pnd, worsening LE edema, palps, dizziness or syncope   Pt denies new neuro symptoms such as headache, facial or extremity weakness   Pt denies polydipsia, polyuria, or low sugar symptoms such as shakiness improved with eating.  Overall good compliance with meds, trying to follow low chol, DM diet, wt stable, little excercise however   Problems Prior to Update: 1)  Preventive Health Care  (ICD-V70.0) 2)  Chalazion, Right  (ICD-373.2) 3)  Long-term Use of Steroids  (ICD-V58.65) 4)  Anxiety  (ICD-300.00) 5)  Diabetes Mellitus, Type II  (ICD-250.00) 6)  Preventive Health Care  (ICD-V70.0) 7)  Myasthenia Gravis Without Exacerbation  (ICD-358.00) 8)  Allergic Rhinitis  (ICD-477.9) 9)  Overweight  (ICD-278.02) 10)  Carpal Tunnel Syndrome, Bilateral  (ICD-354.0) 11)  Erectile Dysfunction  (ICD-302.72) 12)  Hypertension  (ICD-401.9)  Medications Prior to Update: 1)  Benicar 20 Mg Tabs (Olmesartan Medoxomil) .Marland Kitchen.. 1po Qd 2)  Imuran 50 Mg  Tabs (Azathioprine) .... Take 3 Tabs By Mouth Once Daily 3)  Metformin Hcl 500 Mg Tabs (Metformin Hcl) .Marland Kitchen.. 1 By Mouth Once Daily 4)  Prednisone 20 Mg  Tabs (Prednisone) .Marland Kitchen.. 1po Once Daily 5)  Mestinon 60 Mg  Tabs (Pyridostigmine Bromide) .Marland Kitchen.. 1 By Mouth Tid 6)  Hydrochlorothiazide 12.5 Mg  Tabs (Hydrochlorothiazide)  .Marland Kitchen.. 1 By Mouth Qd 7)  Oscal W/ Vit D .... 1po Qd 8)  Vitamin A-Beta Carotene 04540 Unit Caps (Beta Carotene) .Marland Kitchen.. 1 By Mouth Qd 9)  Vitamin C 500 Mg Chew (Ascorbic Acid) .Marland Kitchen.. 1 By Mouth Bid 10)  Vitamin E 200 Unit Caps (Vitamin E) .Marland Kitchen.. 1 By Mouth Qd  Current Medications (verified): 1)  Benicar 20 Mg Tabs (Olmesartan Medoxomil) .Marland Kitchen.. 1po Qd 2)  Imuran 50 Mg  Tabs (Azathioprine) .... Take 3 Tabs By Mouth Once Daily 3)  Metformin Hcl 500 Mg Tabs (Metformin Hcl) .Marland Kitchen.. 1 By Mouth Once Daily 4)  Prednisone 20 Mg  Tabs (Prednisone) .Marland Kitchen.. 1po Once Daily 5)  Mestinon 60 Mg  Tabs (Pyridostigmine Bromide) .Marland Kitchen.. 1 By Mouth Tid 6)  Hydrochlorothiazide 12.5 Mg  Tabs (Hydrochlorothiazide) .Marland Kitchen.. 1 By Mouth Qd 7)  Oscal W/ Vit D .... 1po Qd 8)  Vitamin A-Beta Carotene 98119 Unit Caps (Beta Carotene) .Marland Kitchen.. 1 By Mouth Qd 9)  Vitamin C 500 Mg Chew (Ascorbic Acid) .Marland Kitchen.. 1 By Mouth Bid 10)  Vitamin E 200 Unit Caps (Vitamin E) .Marland Kitchen.. 1 By Mouth Qd 11)  Azithromycin 250 Mg Tabs (Azithromycin) .... 2po Qd For 1 Day, Then 1po Qd For 4days, Then Stop 12)  Hydrocodone-Homatropine 5-1.5 Mg/89ml Syrp (Hydrocodone-Homatropine) .Marland Kitchen.. 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   Head:  normocephalic and atraumatic.   Eyes:  vision grossly intact, pupils equal, and pupils round.   Ears:  bilat tm's red, sinus nontender Nose:  nasal  dischargemucosal pallor and mucosal erythema.   Mouth:  pharyngeal erythema and fair dentition.   Neck:  supple and cervical lymphadenopathy.   Lungs:  normal respiratory effort and normal breath sounds.   Heart:  normal rate and regular rhythm.   Extremities:  no edema, no erythema    Impression & Recommendations:  Problem # 1:  BRONCHITIS-ACUTE (ICD-466.0)  His updated medication list for this problem includes:    Azithromycin 250 Mg Tabs (Azithromycin) .Marland Kitchen... 2po qd for 1 day, then 1po qd for 4days, then stop    Hydrocodone-homatropine 5-1.5 Mg/18ml Syrp (Hydrocodone-homatropine) .Marland Kitchen... 1 tsp by mouth q 6 hrs as needed treat as above, f/u any worsening signs or symptoms   Problem # 2:  DIABETES MELLITUS, TYPE II (ICD-250.00)  His updated medication list for this problem includes:    Benicar 20 Mg Tabs (Olmesartan medoxomil) .Marland Kitchen... 1po qd    Metformin Hcl 500 Mg Tabs (Metformin hcl) .Marland Kitchen... 1 by mouth once daily stable overall by hx and exam, ok to continue meds/tx as is   Labs Reviewed: Creat: 1.4 (07/02/2008)    Reviewed HgBA1c results: 6.2 (07/02/2008)  6.4 (01/06/2008)  Problem # 3:  HYPERTENSION (ICD-401.9)  His updated medication list for this problem includes:    Benicar 20 Mg Tabs (Olmesartan medoxomil) .Marland Kitchen... 1po qd    Hydrochlorothiazide 12.5 Mg Tabs (Hydrochlorothiazide) .Marland Kitchen... 1 by mouth qd  BP today: 126/72 Prior BP: 128/98 (01/04/2009)  Labs Reviewed: K+: 3.8 (07/02/2008) Creat: : 1.4 (07/02/2008)   Chol: 184 (07/02/2008)   HDL: 56.00 (07/02/2008)   LDL: 115 (07/02/2008)   TG: 64.0 (07/02/2008) stable overall by hx and exam, ok to continue meds/tx as is   Complete Medication List: 1)  Benicar 20 Mg Tabs (Olmesartan medoxomil) .Marland Kitchen.. 1po qd 2)  Imuran 50 Mg Tabs (Azathioprine) .... Take 3 tabs by mouth once daily 3)  Metformin Hcl 500 Mg Tabs (Metformin hcl) .Marland Kitchen.. 1 by mouth once daily 4)  Prednisone 20 Mg Tabs (Prednisone) .Marland Kitchen.. 1po once daily 5)  Mestinon 60 Mg  Tabs (Pyridostigmine bromide) .Marland Kitchen.. 1 by mouth tid 6)  Hydrochlorothiazide 12.5 Mg Tabs (Hydrochlorothiazide) .Marland Kitchen.. 1 by mouth qd 7)  Oscal W/ Vit D  .... 1po qd 8)  Vitamin A-beta Carotene 87564 Unit Caps (Beta carotene) .Marland Kitchen.. 1 by mouth qd 9)  Vitamin C 500 Mg Chew (Ascorbic acid) .Marland Kitchen.. 1 by mouth bid 10)  Vitamin E 200 Unit Caps (Vitamin e) .Marland Kitchen.. 1 by mouth qd 11)  Azithromycin 250 Mg Tabs (Azithromycin) .... 2po qd for 1 day, then 1po qd for 4days, then stop 12)  Hydrocodone-homatropine 5-1.5 Mg/62ml Syrp (Hydrocodone-homatropine) .Marland Kitchen.. 1 tsp by mouth q 6 hrs as needed  Patient Instructions: 1)  Please take all new medications as prescribed 2)  Continue all previous  medications as before this visit  3)  you are given the work note today 4)  You can also use Mucinex D OTC or it's generic for congestion (called Mucous Free at [facility]) 5)  Please schedule a follow-up appointment as needed. Prescriptions: HYDROCODONE-HOMATROPINE 5-1.5 MG/5ML SYRP (HYDROCODONE-HOMATROPINE) 1 tsp by mouth q 6 hrs as needed  #6 oz x 1   Entered and Authorized by:   Corwin Levins MD   Signed by:   Corwin Levins MD on 03/22/2009   Method used:   Print then Give to Patient   RxID:   [id number] AZITHROMYCIN 250 MG TABS (AZITHROMYCIN) 2po qd for 1 day, then 1po qd for 4days, then stop  #6 x 1   Entered and Authorized by:   Corwin Levins MD   Signed by:   Corwin Levins MD on 03/22/2009   Method used:   Print then Give to Patient   RxID:   [id number]

## 2010-03-17 NOTE — Letter (Signed)
Summary: [facility]   Imported By: Sherian Rein 02/03/2010 12:15:29  _____________________________________________________________________  External Attachment:    Type:   Image     Comment:   External Document

## 2010-03-24 ENCOUNTER — Ambulatory Visit: Payer: Self-pay | Admitting: Family Medicine

## 2010-04-04 ENCOUNTER — Encounter: Payer: Self-pay | Admitting: Internal Medicine

## 2010-04-12 ENCOUNTER — Encounter: Payer: Self-pay | Admitting: Family Medicine

## 2010-04-12 ENCOUNTER — Encounter (INDEPENDENT_AMBULATORY_CARE_PROVIDER_SITE_OTHER): Payer: BC Managed Care – PPO | Admitting: Family Medicine

## 2010-04-12 ENCOUNTER — Encounter: Payer: BC Managed Care – PPO | Admitting: Family Medicine

## 2010-04-12 DIAGNOSIS — I10 Essential (primary) hypertension: Secondary | ICD-10-CM

## 2010-04-12 DIAGNOSIS — Z79899 Other long term (current) drug therapy: Secondary | ICD-10-CM

## 2010-04-12 DIAGNOSIS — Z0000 Encounter for general adult medical examination without abnormal findings: Secondary | ICD-10-CM

## 2010-04-12 DIAGNOSIS — E559 Vitamin D deficiency, unspecified: Secondary | ICD-10-CM

## 2010-04-15 ENCOUNTER — Telehealth: Payer: Self-pay | Admitting: Internal Medicine

## 2010-04-21 NOTE — Progress Notes (Signed)
Summary: Needs new rx  Phone Note Call from Patient Call back at Home Phone [phone]   Summary of Call: Patient is requesting a call back regarding wrist splint. He picked up recent rx but needs new rx for XL wrist splint "with steel"  Initial call taken by: Lamar Sprinkles, CMA,  April 15, 2010 1:58 PM  Follow-up for Phone Call        I think he can go to [facility] on [address] to see about this Follow-up by: Corwin Levins MD,  April 15, 2010 4:27 PM  Additional Follow-up for Phone Call Additional follow up Details #1::        Pt advised of above via home VM  Additional Follow-up by: Margaret Pyle, CMA,  April 15, 2010 4:43 PM

## 2010-04-28 LAB — DIFFERENTIAL
Lymphs Abs: 1.8 10*3/uL (ref 0.7–4.0)
Monocytes Relative: 9 % (ref 3–12)
Neutro Abs: 2.8 10*3/uL (ref 1.7–7.7)
Neutrophils Relative %: 54 % (ref 43–77)

## 2010-04-28 LAB — CBC
HCT: 38.4 % — ABNORMAL LOW (ref 39.0–52.0)
MCH: 31.8 pg (ref 26.0–34.0)
MCHC: 34.9 g/dL (ref 30.0–36.0)
MCV: 91.2 fL (ref 78.0–100.0)
RDW: 13.1 % (ref 11.5–15.5)

## 2010-04-28 LAB — COMPREHENSIVE METABOLIC PANEL
BUN: 11 mg/dL (ref 6–23)
Calcium: 9.4 mg/dL (ref 8.4–10.5)
Creatinine, Ser: 1.62 mg/dL — ABNORMAL HIGH (ref 0.4–1.5)
Glucose, Bld: 104 mg/dL — ABNORMAL HIGH (ref 70–99)
Sodium: 142 mEq/L (ref 135–145)
Total Protein: 6.7 g/dL (ref 6.0–8.3)

## 2010-05-11 ENCOUNTER — Ambulatory Visit: Payer: BC Managed Care – PPO | Admitting: Internal Medicine

## 2010-05-11 ENCOUNTER — Encounter: Payer: Self-pay | Admitting: Internal Medicine

## 2010-05-11 DIAGNOSIS — R05 Cough: Secondary | ICD-10-CM

## 2010-05-11 DIAGNOSIS — E119 Type 2 diabetes mellitus without complications: Secondary | ICD-10-CM

## 2010-05-11 DIAGNOSIS — I10 Essential (primary) hypertension: Secondary | ICD-10-CM

## 2010-05-11 DIAGNOSIS — F411 Generalized anxiety disorder: Secondary | ICD-10-CM

## 2010-05-17 NOTE — Assessment & Plan Note (Signed)
Summary: cough   Vital Signs:  Patient profile:   50 year old male Height:      71 inches Weight:      253 pounds BMI:     35.41 Temp:     97.8 degrees F 99oral Pulse rate:   68 / minute BP sitting:   160 / 78  (left arm) Cuff size:   large  Vitals Entered By: Zella Ball Ewing CMA Duncan Dull) (May 11, 2010 11:58 AM)  O2 Flow:  Room air CC: Cough due to medication/RE   CC:  Cough due to medication/RE.  History of Present Illness: here to f/u - overall doing ok, but with persistent chronic cough, and he is concerned his current meds may be related;  Pt denies CP, worsening sob, doe, wheezing, orthopnea, pnd, worsening LE edema, palps, dizziness or syncope  Pt denies new neuro symptoms such as headache, facial or extremity weakness  Pt denies polydipsia, polyuria, or low sugar symptoms such as shakiness improved with eating.  Overall good compliance with meds, trying to follow low chol, DM diet, wt overall up 13 lbs since sept 2011 (though has lost 10 lbs in the past month per pt), little excercise however  CBG's at home in low 100's.  Wt had gone up due to being out of work for 5 months getting skin lesions removed per surgury - dr Abbey Chatters, now resolved.  Also he has been able to decrease the prednisone to 10 mg every other day since sept 2011, and as of last wk also with decreased imuran as well.  Overall good compliance with meds, and good tolerability.  No fever, wt loss, night sweats, loss of appetite or other constitutional symptoms   Problems Prior to Update: 1)  Renal Insufficiency  (ICD-588.9) 2)  Cough  (ICD-786.2) 3)  Other Specified Forms of Hearing Loss  (ICD-389.8) 4)  Preventive Health Care  (ICD-V70.0) 5)  Chalazion, Right  (ICD-373.2) 6)  Long-term Use of Steroids  (ICD-V58.65) 7)  Anxiety  (ICD-300.00) 8)  Diabetes Mellitus, Type II  (ICD-250.00) 9)  Preventive Health Care  (ICD-V70.0) 10)  Myasthenia Gravis Without Exacerbation  (ICD-358.00) 11)  Allergic Rhinitis   (ICD-477.9) 12)  Overweight  (ICD-278.02) 13)  Carpal Tunnel Syndrome, Bilateral  (ICD-354.0) 14)  Erectile Dysfunction  (ICD-302.72) 15)  Hypertension  (ICD-401.9)  Medications Prior to Update: 1)  Benicar 20 Mg Tabs (Olmesartan Medoxomil) .Marland Kitchen.. 1po Qd 2)  Imuran 50 Mg  Tabs (Azathioprine) .... Take 3 Tabs By Mouth Once Daily 3)  Prednisone 20 Mg  Tabs (Prednisone) .Marland Kitchen.. 1po Once Daily 4)  Mestinon 60 Mg  Tabs (Pyridostigmine Bromide) .Marland Kitchen.. 1 By Mouth Tid 5)  Hydrochlorothiazide 12.5 Mg  Tabs (Hydrochlorothiazide) .Marland Kitchen.. 1 By Mouth Qd 6)  Oscal W/ Vit D .... 1po Qd 7)  Vitamin A-Beta Carotene 16109 Unit Caps (Beta Carotene) .Marland Kitchen.. 1 By Mouth Qd 8)  Vitamin C 500 Mg Chew (Ascorbic Acid) .Marland Kitchen.. 1 By Mouth Bid 9)  Vitamin E 200 Unit Caps (Vitamin E) .Marland Kitchen.. 1 By Mouth Qd 10)  Fluticasone Propionate 50 Mcg/act Susp (Fluticasone Propionate) .... 2 Sprayside Once Daily 11)  Bilateral Wrist Splints - Xtra Large .... Use Asd  Current Medications (verified): 1)  Benicar 20 Mg Tabs (Olmesartan Medoxomil) .Marland Kitchen.. 1po Qd 2)  Imuran 50 Mg  Tabs (Azathioprine) .Marland Kitchen.. 1 and 1/2 By Mouth Once Daily 3)  Prednisone 10 Mg Tabs (Prednisone) .Marland Kitchen.. 1 By Mouth Every Other Day 4)  Mestinon 60 Mg  Tabs (Pyridostigmine  Bromide) .Marland Kitchen.. 1 By Mouth Tid 5)  Hydrochlorothiazide 12.5 Mg  Tabs (Hydrochlorothiazide) .Marland Kitchen.. 1 By Mouth Qd 6)  Oscal W/ Vit D .... 1po Qd 7)  Vitamin A-Beta Carotene 16109 Unit Caps (Beta Carotene) .Marland Kitchen.. 1 By Mouth Qd 8)  Vitamin C 500 Mg Chew (Ascorbic Acid) .Marland Kitchen.. 1 By Mouth Bid 9)  Vitamin E 200 Unit Caps (Vitamin E) .Marland Kitchen.. 1 By Mouth Qd 10)  Fluticasone Propionate 50 Mcg/act Susp (Fluticasone Propionate) .... 2 Sprayside Once Daily 11)  Pantoprazole Sodium 40 Mg Tbec (Pantoprazole Sodium) .Marland Kitchen.. 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 , not ill appearing Head:  normocephalic and atraumatic.   Eyes:  vision grossly intact, pupils equal, and pupils round.   Ears:  R ear normal and L ear normal.   Nose:  nasal dischargemucosal pallor and mucosal edema.   Mouth:  no gingival abnormalities and pharynx pink and moist.   Neck:  supple and no masses.   Lungs:  normal respiratory effort and normal breath sounds.   Heart:  normal rate and regular rhythm.   Abdomen:  soft, non-tender, and normal bowel sounds.   Msk:  no joint tenderness and no joint swelling.   Extremities:  no edema, no erythema  Neurologic:  cranial nerves II-XII intact and strength normal in all extremities.     Impression & Recommendations:  Problem # 1:  COUGH (ICD-786.2)  chronic, d/w pt   - not due to meds, did have presumed neg cxr sept Jun 28, 2009 per neurology, adn on chronic prednisone which make allergy/asthma very unlikely as cause;  has also seen ent and pulm as well;  given that most likely is reflux subclinical - for protonix 40 once daily, f/u next visit  Orders: No Charge Patient Arrived (NCPA0) (NCPA0)  Problem # 2:  DIABETES MELLITUS, TYPE II (ICD-250.00)  His updated medication list for this problem includes:    Benicar 20 Mg Tabs (Olmesartan medoxomil) .Marland Kitchen... 1po qd  Labs Reviewed: Creat: 1.5 (11/11/2009)    Reviewed HgBA1c  results: 6.1 (07/05/2009)  6.2 (07/02/2008) stable overall by hx and exam, ok to continue meds/tx as is , for f/u labs next visit  Problem # 3:  HYPERTENSION (ICD-401.9)  His updated medication list for this problem includes:    Benicar 20 Mg Tabs (Olmesartan medoxomil) .Marland Kitchen... 1po qd    Hydrochlorothiazide 12.5 Mg Tabs (Hydrochlorothiazide) .Marland Kitchen... 1 by mouth qd  BP today: 160/78 Prior BP: 122/68 (11/11/2009)  Labs Reviewed: K+: 4.8 (11/11/2009) Creat: : 1.5 (11/11/2009)   Chol: 177 (07/05/2009)   HDL: 71.80 (07/05/2009)   LDL: 90 (07/05/2009)   TG: 75.0 (07/05/2009) mild elev today, likely situational, ok to follow, continue same treatment , to cont f/u with BP at home as he does  Problem #  4:  ANXIETY (ICD-300.00)  Discussed medication use and relaxation techniques.  stable overall by hx and exam, ok to continue meds/tx as is   Complete Medication List: 1)  Benicar 20 Mg Tabs (Olmesartan medoxomil) .Marland Kitchen.. 1po qd 2)  Imuran 50 Mg Tabs (Azathioprine) .Marland Kitchen.. 1 and 1/2 by mouth once daily 3)  Prednisone 10 Mg Tabs (Prednisone) .Marland Kitchen.. 1 by mouth every other day 4)  Mestinon 60 Mg Tabs (Pyridostigmine bromide) .Marland Kitchen.. 1 by mouth tid 5)  Hydrochlorothiazide 12.5 Mg Tabs (Hydrochlorothiazide) .Marland Kitchen.. 1 by mouth qd 6)  Oscal W/ Vit D  .... 1po qd 7)  Vitamin A-beta Carotene 47829 Unit Caps (Beta carotene) .Marland Kitchen.. 1 by mouth qd 8)  Vitamin C 500 Mg Chew (Ascorbic acid) .Marland Kitchen.. 1 by mouth bid 9)  Vitamin E 200 Unit Caps (Vitamin e) .Marland Kitchen.. 1 by mouth qd 10)  Fluticasone Propionate 50 Mcg/act Susp (Fluticasone propionate) .... 2 sprayside once daily 11)  Pantoprazole Sodium 40 Mg Tbec (Pantoprazole sodium) .Marland Kitchen.. 1 by mouth once daily  Patient Instructions: 1)  Please take all new medications as prescribed  - the generic protonix 2)  Continue all previous medications as before this visit 3)  The office will call to have you followup in May 2012 with labs before Prescriptions: PANTOPRAZOLE SODIUM 40 MG TBEC  (PANTOPRAZOLE SODIUM) 1 by mouth once daily  #30 x 11   Entered and Authorized by:   Corwin Levins MD   Signed by:   Corwin Levins MD on 05/11/2010   Method used:   Print then Give to Patient   RxID:   [id number]    Orders Added: 1)  No Charge Patient Arrived (NCPA0) [NCPA0]

## 2010-06-16 ENCOUNTER — Other Ambulatory Visit: Payer: Self-pay | Admitting: Internal Medicine

## 2010-07-20 ENCOUNTER — Other Ambulatory Visit: Payer: Self-pay | Admitting: Internal Medicine

## 2010-11-25 LAB — CBC
Hemoglobin: 14.3
MCHC: 34.4
MCV: 88.1
RBC: 4.71

## 2010-11-25 LAB — DIFFERENTIAL
Basophils Relative: 0
Eosinophils Absolute: 0
Lymphs Abs: 0.8
Monocytes Absolute: 0 — ABNORMAL LOW
Monocytes Relative: 1 — ABNORMAL LOW
Neutro Abs: 3.8

## 2010-11-25 LAB — I-STAT 8, (EC8 V) (CONVERTED LAB)
BUN: 11
Chloride: 104
HCT: 46
Operator id: 146091
Potassium: 3.8
pCO2, Ven: 39.7 — ABNORMAL LOW

## 2010-11-25 LAB — POCT CARDIAC MARKERS
Myoglobin, poc: 63.1
Operator id: 146091

## 2011-01-04 ENCOUNTER — Other Ambulatory Visit: Payer: Self-pay | Admitting: Allergy

## 2011-01-04 ENCOUNTER — Ambulatory Visit
Admission: RE | Admit: 2011-01-04 | Discharge: 2011-01-04 | Disposition: A | Discharge status: Home / Self Care | Payer: BC Managed Care – PPO | Source: Ambulatory Visit | Attending: Allergy | Admitting: Allergy

## 2011-01-04 DIAGNOSIS — R05 Cough: Secondary | ICD-10-CM

## 2011-04-28 DIAGNOSIS — H02402 Unspecified ptosis of left eyelid: Secondary | ICD-10-CM | POA: Insufficient documentation

## 2011-08-03 ENCOUNTER — Other Ambulatory Visit: Payer: Self-pay | Admitting: Internal Medicine

## 2011-09-06 ENCOUNTER — Other Ambulatory Visit: Payer: Self-pay | Admitting: Internal Medicine

## 2011-09-12 ENCOUNTER — Other Ambulatory Visit: Payer: Self-pay | Admitting: Internal Medicine

## 2011-09-22 ENCOUNTER — Other Ambulatory Visit: Payer: Self-pay | Admitting: Internal Medicine

## 2011-09-22 MED ORDER — HYDROCHLOROTHIAZIDE 12.5 MG PO CAPS: 12.5000 mg | ORAL_CAPSULE | Freq: Every day | ORAL | Status: DC

## 2011-09-22 NOTE — Telephone Encounter (Signed)
Called the patient informed he has not been seen since 3/12 and had informed pharmacy overdue for OV.  Informed the patient he would need to schedule office visit and would give him #30 until OV.  Patient agreed to all.

## 2011-10-05 ENCOUNTER — Ambulatory Visit: Payer: BC Managed Care – PPO | Admitting: Internal Medicine

## 2011-10-05 DIAGNOSIS — Z0289 Encounter for other administrative examinations: Secondary | ICD-10-CM

## 2011-10-27 ENCOUNTER — Other Ambulatory Visit: Payer: Self-pay | Admitting: Internal Medicine

## 2011-10-30 ENCOUNTER — Encounter: Payer: Self-pay | Admitting: Internal Medicine

## 2011-10-30 ENCOUNTER — Ambulatory Visit (INDEPENDENT_AMBULATORY_CARE_PROVIDER_SITE_OTHER): Payer: BC Managed Care – PPO | Admitting: Internal Medicine

## 2011-10-30 VITALS — BP 112/70 | HR 78 | Temp 97.5°F | Ht 72.0 in | Wt 243.4 lb

## 2011-10-30 DIAGNOSIS — H6120 Impacted cerumen, unspecified ear: Secondary | ICD-10-CM

## 2011-10-30 DIAGNOSIS — G7000 Myasthenia gravis without (acute) exacerbation: Secondary | ICD-10-CM | POA: Insufficient documentation

## 2011-10-30 DIAGNOSIS — Z23 Encounter for immunization: Secondary | ICD-10-CM

## 2011-10-30 DIAGNOSIS — Z0000 Encounter for general adult medical examination without abnormal findings: Secondary | ICD-10-CM

## 2011-10-30 DIAGNOSIS — J309 Allergic rhinitis, unspecified: Secondary | ICD-10-CM

## 2011-10-30 DIAGNOSIS — E119 Type 2 diabetes mellitus without complications: Secondary | ICD-10-CM

## 2011-10-30 MED ORDER — HYDROCHLOROTHIAZIDE 12.5 MG PO CAPS: 12.5000 mg | ORAL_CAPSULE | ORAL | Status: DC

## 2011-10-30 MED ORDER — FEXOFENADINE HCL 180 MG PO TABS: 180.0000 mg | ORAL_TABLET | Freq: Every day | ORAL | Status: DC

## 2011-10-30 MED ORDER — OLMESARTAN MEDOXOMIL 20 MG PO TABS: 20.0000 mg | ORAL_TABLET | Freq: Every day | ORAL | Status: DC

## 2011-10-30 NOTE — Assessment & Plan Note (Signed)
/  Mild to mod, for allegra prn,  to f/u any worsening symptoms or concerns 

## 2011-10-30 NOTE — Assessment & Plan Note (Signed)
Will need to obtain [facility] labs, Continue all other medications as before,  to f/u any worsening symptoms or concerns

## 2011-10-30 NOTE — Patient Instructions (Addendum)
Your ears were irrigated today of wax Take all new medications as prescribed - the allegra Continue all other medications as before Your refills were done as reqeuested today We will call Dr Meredith Mody of Neurosurgury at [facility] for your lab results Please return in 6 mo with Lab testing done 3-5 days before

## 2011-10-30 NOTE — Progress Notes (Signed)
Subjective:    Patient ID: Miguel Powell, male    DOB: 09-May-1960, 51 y.o.   MRN: [id number]  HPI  Here for wellness and f/u;  Overall doing ok;  Pt denies CP, worsening SOB, DOE, wheezing, orthopnea, PND, worsening LE edema, palpitations, dizziness or syncope.  Pt denies neurological change such as new Headache, facial or extremity weakness.  Pt denies polydipsia, polyuria, or low sugar symptoms. Pt states overall good compliance with treatment and medications, good tolerability, and trying to follow lower cholesterol diet.  Pt denies worsening depressive symptoms, suicidal ideation or panic. No fever, wt loss, night sweats, loss of appetite, or other constitutional symptoms.  Pt states good ability with ADL's, low fall risk, home safety reviewed and adequate, no significant changes in hearing or vision, and occasionally active with exercise.  Had recent labs including blood sugar at [facility], has bilat ear hearing loss, mild in the past wk - ? Wax.  Does have several wks ongoing nasal allergy symptoms with clear congestion, itch and sneeze, without fever, pain.  Has mild bilat CTS symptoms apparently diagnosed with NCS per chiropracter but declines tx. Past Medical History  Diagnosis Date  . ANXIETY 05/16/2007  . ERECTILE DYSFUNCTION 09/04/2006  . CARPAL TUNNEL SYNDROME, BILATERAL 09/04/2006  . Myasthenia gravis without exacerbation 05/16/2007  . CHALAZION, RIGHT 01/06/2008  . HYPERTENSION 09/04/2006  . ALLERGIC RHINITIS 09/04/2006  . RENAL INSUFFICIENCY 11/11/2009  . Myasthenia gravis   . DIABETES MELLITUS, TYPE II 05/16/2007   Past Surgical History  Procedure Date  . Lumbar surgury 1987    reports that he has never smoked. He does not have any smokeless tobacco history on file. He reports that he drinks alcohol. He reports that he does not use illicit drugs. family history includes Aneurysm (age of onset:33) in his brother; Cancer in his sister; Diabetes in his mother and sister; and Hypertension in  his sister. No Known Allergies Current Outpatient Prescriptions on File Prior to Visit  Medication Sig Dispense Refill  . azaTHIOprine (IMURAN) 50 MG tablet Take 50 mg by mouth 3 (three) times daily.        Marland Kitchen BENICAR 20 MG tablet TAKE ONE TABLET BY MOUTH EVERY DAY  30 each  11  . predniSONE (DELTASONE) 20 MG tablet Take 20 mg by mouth daily.        Marland Kitchen pyridostigmine (MESTINON) 60 MG tablet Take 60 mg by mouth 3 (three) times daily.        Marland Kitchen DISCONTD: hydrochlorothiazide (MICROZIDE) 12.5 MG capsule TAKE ONE CAPSULE BY MOUTH EVERY DAY  30 capsule  3  . Ascorbic Acid (VITAMIN C) 500 MG tablet Take 500 mg by mouth 2 (two) times daily.        . beta carotene (BL BETA CAROTENE) 40981 UNIT capsule Take 25,000 Units by mouth daily.        . calcium-vitamin D (OSCAL 500/200 D-3) 500-200 MG-UNIT per tablet Take 1 tablet by mouth daily.        . fexofenadine (ALLEGRA) 180 MG tablet Take 1 tablet (180 mg total) by mouth daily.  90 tablet  3  . fluticasone (FLONASE) 50 MCG/ACT nasal spray 2 sprays by Nasal route daily.        . vitamin E 200 UNIT capsule Take 200 Units by mouth daily.         Review of Systems Review of Systems  Constitutional: Negative for diaphoresis, activity change, appetite change and unexpected weight change.  HENT: Negative for hearing loss,  ear pain, facial swelling, mouth sores and neck stiffness.   Eyes: Negative for pain, redness and visual disturbance.  Respiratory: Negative for shortness of breath and wheezing.   Cardiovascular: Negative for chest pain and palpitations.  Gastrointestinal: Negative for diarrhea, blood in stool, abdominal distention and rectal pain.  Genitourinary: Negative for hematuria, flank pain and decreased urine volume.  Musculoskeletal: Negative for myalgias and joint swelling.  Skin: Negative for color change and wound.  Neurological: Negative for syncope and numbness. except for the above Hematological: Negative for adenopathy.    Psychiatric/Behavioral: Negative for hallucinations, self-injury, decreased concentration and agitation.     Objective:   Physical Exam BP 112/70  Pulse 78  Temp 97.5 F (36.4 C) (Oral)  Ht 6' (1.829 m)  Wt 243 lb 6 oz (110.394 kg)  BMI 33.01 kg/m2  SpO2 97% Physical Exam  VS noted Constitutional: Pt is oriented to person, place, and time. Appears well-developed and well-nourished.  HENT:  Head: Normocephalic and atraumatic.  Right Ear: External ear normal.  Left Ear: External ear normal.  Nose: Nose normal.  Mouth/Throat: Oropharynx is clear and moist. Bilat tm's clear after wax irrigation Eyes: Conjunctivae and EOM are normal. Pupils are equal, round, and reactive to light.  Neck: Normal range of motion. Neck supple. No JVD present. No tracheal deviation present.  Cardiovascular: Normal rate, regular rhythm, normal heart sounds and intact distal pulses.   Pulmonary/Chest: Effort normal and breath sounds normal.  Abdominal: Soft. Bowel sounds are normal. There is no tenderness.  Musculoskeletal: Normal range of motion. Exhibits no edema.  Lymphadenopathy:  Has no cervical adenopathy.  Neurological: Pt is alert and oriented to person, place, and time. Pt has normal reflexes. No cranial nerve deficit.  Skin: Skin is warm and dry. No rash noted.  Psychiatric:  Has  normal mood and affect. Behavior is normal.     Assessment & Plan:

## 2011-10-30 NOTE — Assessment & Plan Note (Signed)
Improved with irrigaiton bilat

## 2011-10-30 NOTE — Assessment & Plan Note (Signed)

## 2011-11-30 ENCOUNTER — Other Ambulatory Visit: Payer: Self-pay

## 2011-11-30 MED ORDER — OLMESARTAN MEDOXOMIL 20 MG PO TABS: 20.0000 mg | ORAL_TABLET | Freq: Every day | ORAL | Status: DC

## 2012-03-26 ENCOUNTER — Other Ambulatory Visit: Payer: Self-pay | Admitting: *Deleted

## 2012-03-26 MED ORDER — HYDROCHLOROTHIAZIDE 12.5 MG PO CAPS: 12.5000 mg | ORAL_CAPSULE | ORAL | Status: DC

## 2012-03-26 NOTE — Telephone Encounter (Signed)
Left msg on triage stating needing refill on his HCTZ. Pls send to [facility]...Raechel Chute

## 2012-03-26 NOTE — Telephone Encounter (Signed)
Called pt no answer LMOM rx sent to [facility]...Raechel Chute

## 2012-04-26 ENCOUNTER — Ambulatory Visit: Payer: BC Managed Care – PPO | Admitting: Internal Medicine

## 2012-04-30 ENCOUNTER — Ambulatory Visit: Payer: BC Managed Care – PPO | Admitting: Internal Medicine

## 2012-05-10 ENCOUNTER — Encounter: Payer: Self-pay | Admitting: Internal Medicine

## 2012-05-10 ENCOUNTER — Telehealth: Payer: Self-pay | Admitting: Internal Medicine

## 2012-05-10 ENCOUNTER — Ambulatory Visit (INDEPENDENT_AMBULATORY_CARE_PROVIDER_SITE_OTHER): Payer: BC Managed Care – PPO | Admitting: Internal Medicine

## 2012-05-10 VITALS — BP 118/80 | HR 63 | Temp 97.6°F | Ht 72.0 in | Wt 240.2 lb

## 2012-05-10 DIAGNOSIS — E119 Type 2 diabetes mellitus without complications: Secondary | ICD-10-CM

## 2012-05-10 DIAGNOSIS — F411 Generalized anxiety disorder: Secondary | ICD-10-CM

## 2012-05-10 DIAGNOSIS — J309 Allergic rhinitis, unspecified: Secondary | ICD-10-CM

## 2012-05-10 DIAGNOSIS — Z0000 Encounter for general adult medical examination without abnormal findings: Secondary | ICD-10-CM

## 2012-05-10 DIAGNOSIS — F528 Other sexual dysfunction not due to a substance or known physiological condition: Secondary | ICD-10-CM

## 2012-05-10 DIAGNOSIS — I10 Essential (primary) hypertension: Secondary | ICD-10-CM

## 2012-05-10 MED ORDER — SILDENAFIL CITRATE 100 MG PO TABS: 50.0000 mg | ORAL_TABLET | Freq: Every day | ORAL | Status: DC | PRN

## 2012-05-10 MED ORDER — FLUTICASONE PROPIONATE 50 MCG/ACT NA SUSP: 2.0000 | Freq: Every day | NASAL | Status: DC

## 2012-05-10 MED ORDER — AMLODIPINE BESYLATE 5 MG PO TABS: 5.0000 mg | ORAL_TABLET | Freq: Every day | ORAL | Status: DC

## 2012-05-10 NOTE — Telephone Encounter (Signed)
Patient was just seen in the office and given a Rx for Viagra and a "Free Card for a few pills".  Patient states he presented the card for the free refills but it had expired on 01/2012.  He was wondering would Dr. Jonny Ruiz have another Free Card.  Advised I would send message to Dr. Jonny Ruiz. Caller states he is now back at the office and is going to try to get another. Understanding expressed.  Please contact patient for "free Rx card for the viagra"  [phone]

## 2012-05-10 NOTE — Progress Notes (Signed)
Subjective:    Patient ID: Miguel Powell, male    DOB: [birth year], 52 y.o.   MRN: [id number]  HPI  Here to f/u; overall doing ok,  Pt denies chest pain, increased sob or doe, wheezing, orthopnea, PND, increased LE swelling, palpitations, dizziness or syncope.  Pt denies polydipsia, polyuria, or low sugar symptoms such as weakness or confusion improved with po intake.  Pt denies new neurological symptoms such as new headache, or facial or extremity weakness or numbness.   Pt states overall good compliance with meds, has been trying to follow lower cholesterol, diabetic diet, with wt overall stable,  but little exercise however.  S/p plasmapheresis for eye involvment/worsening for the myasthenia.  Had physical labs done feb 2014, and last PSA was oct 2013 related to blood draw at work  Has ED symptoms worse in 6 mo, still with AM erections,  Denies worsening depressive symptoms, suicidal ideation, or panic Does have several wks ongoing nasal allergy symptoms with clearish congestion, itch and sneezing, without fever, pain, ST, cough, swelling or wheezing., and not using his flonase recently, though this worked well in the past Had a OSA done oct 2013 elsewhere Past Medical History  Diagnosis Date  . ERECTILE DYSFUNCTION 09/04/2006  . CARPAL TUNNEL SYNDROME, BILATERAL 09/04/2006  . Myasthenia gravis without exacerbation 05/16/2007  . CHALAZION, RIGHT 01/06/2008  . Myasthenia gravis   . ALLERGIC RHINITIS 09/04/2006  . ANXIETY 05/16/2007  . DIABETES MELLITUS, TYPE II 05/16/2007  . HYPERTENSION 09/04/2006  . RENAL INSUFFICIENCY 11/11/2009   Past Surgical History  Procedure Laterality Date  . Lumbar surgury  1987    reports that he has never smoked. He has never used smokeless tobacco. He reports that  drinks alcohol. He reports that he does not use illicit drugs. family history includes Aneurysm (age of onset: 51) in his brother; Cancer in his sister; Diabetes in his mother and sister; and Hypertension in  his sister. No Known Allergies Current Outpatient Prescriptions on File Prior to Visit  Medication Sig Dispense Refill  . Ascorbic Acid (VITAMIN C) 500 MG tablet Take 500 mg by mouth 2 (two) times daily.        Marland Kitchen azaTHIOprine (IMURAN) 50 MG tablet Take 50 mg by mouth 3 (three) times daily.        Marland Kitchen BENICAR 20 MG tablet TAKE ONE TABLET BY MOUTH EVERY DAY  30 each  11  . beta carotene (BL BETA CAROTENE) 40981 UNIT capsule Take 25,000 Units by mouth daily.        . calcium-vitamin D (OSCAL 500/200 D-3) 500-200 MG-UNIT per tablet Take 1 tablet by mouth daily.        . fexofenadine (ALLEGRA) 180 MG tablet Take 1 tablet (180 mg total) by mouth daily.  90 tablet  3  . olmesartan (BENICAR) 20 MG tablet Take 1 tablet (20 mg total) by mouth daily.  90 tablet  3  . predniSONE (DELTASONE) 20 MG tablet Take 20 mg by mouth daily.        Marland Kitchen pyridostigmine (MESTINON) 60 MG tablet Take 60 mg by mouth 3 (three) times daily.        . vitamin E 200 UNIT capsule Take 200 Units by mouth daily.         No current facility-administered medications on file prior to visit.    Review of Systems  Constitutional: Negative for unexpected weight change, or unusual diaphoresis  HENT: Negative for tinnitus.   Eyes: Negative for photophobia and visual  disturbance.  Respiratory: Negative for choking and stridor.   Gastrointestinal: Negative for vomiting and blood in stool.  Genitourinary: Negative for hematuria and decreased urine volume.  Musculoskeletal: Negative for acute joint swelling Skin: Negative for color change and wound.  Neurological: Negative for tremors and numbness other than noted  Psychiatric/Behavioral: Negative for decreased concentration or  hyperactivity.       Objective:   Physical Exam BP 118/80  Pulse 63  Temp(Src) 97.6 F (36.4 C) (Oral)  Ht 6' (1.829 m)  Wt 240 lb 4 oz (108.977 kg)  BMI 32.58 kg/m2  SpO2 99% VS noted, not ill appaering Constitutional: Pt appears well-developed and  well-nourished.  HENT: Head: NCAT.  Right Ear: External ear normal.  Left Ear: External ear normal.  Eyes: Conjunctivae and EOM are normal. Pupils are equal, round, and reactive to light.  Bilat tm's with mild erythema.  Max sinus areas mild tender.  Pharynx with mild erythema, no exudate Neck: Normal range of motion. Neck supple.  Cardiovascular: Normal rate and regular rhythm.   Pulmonary/Chest: Effort normal and breath sounds normal.  Abd:  Soft, NT, non-distended, + BS Neurological: Pt is alert. Not confused  Skin: Skin is warm. No erythema.  Psychiatric: Pt behavior is normal. Thought content normal.     Assessment & Plan:

## 2012-05-10 NOTE — Patient Instructions (Addendum)
Zella Ball will call [facility] in [place] -  [phone] , or [phone] to try to get the PSA as well Please take all new medication as prescribed - the amlodipine at 5 mg per day, and the flonase, and the viagra OK to stop the fluid pill (HCT) to see if this helps Please continue all other medications as before Please have the pharmacy call with any other refills you may need. No further blood work needed today Please return in 6 months, or sooner if needed, with Lab testing done 3-5 days before

## 2012-05-11 NOTE — Assessment & Plan Note (Signed)
stable overall by history and exam, recent data reviewed with pt, and pt to continue medical treatment as before,  to f/u any worsening symptoms or concerns  Last a1c 6.2 per [place] labs in feb 2014

## 2012-05-11 NOTE — Assessment & Plan Note (Signed)
For viagra prn,  to f/u any worsening symptoms or concerns 

## 2012-05-11 NOTE — Assessment & Plan Note (Signed)
Mild to mod, for flonase restart,  to f/u any worsening symptoms or concerns 

## 2012-05-11 NOTE — Assessment & Plan Note (Signed)
stable overall by history and exam, recent data reviewed with pt, and pt to continue medical treatment as before,  to f/u any worsening symptoms or concerns Lab Results  Component Value Date   WBC 5.2 11/02/2009   HGB 13.4 11/02/2009   HCT 38.4* 11/02/2009   PLT 244 11/02/2009   GLUCOSE 171* 11/11/2009   CHOL 177 07/05/2009   TRIG 75.0 07/05/2009   HDL 71.80 07/05/2009   LDLCALC 90 07/05/2009   ALT 17 11/02/2009   AST 26 11/02/2009   NA 141 11/11/2009   K 4.8 11/11/2009   CL 101 11/11/2009   CREATININE 1.5 11/11/2009   BUN 16 11/11/2009   CO2 33* 11/11/2009   TSH 0.90 07/05/2009   PSA 1.12 07/05/2009   HGBA1C 6.1 07/05/2009   MICROALBUR 1.0 07/05/2009

## 2012-05-11 NOTE — Assessment & Plan Note (Addendum)
stable overall by history and exam, recent data reviewed with pt, but pt to change hct to amlod 5 due to ED symtpoms possbily related,  to f/u any worsening symptoms or concerns BP Readings from Last 3 Encounters:  05/10/12 118/80  10/30/11 112/70  05/11/10 160/78

## 2012-05-24 ENCOUNTER — Encounter: Payer: Self-pay | Admitting: Internal Medicine

## 2012-08-10 ENCOUNTER — Other Ambulatory Visit: Payer: Self-pay | Admitting: Internal Medicine

## 2012-11-08 ENCOUNTER — Ambulatory Visit: Payer: BC Managed Care – PPO | Admitting: Internal Medicine

## 2012-11-15 ENCOUNTER — Ambulatory Visit: Payer: BC Managed Care – PPO | Admitting: Internal Medicine

## 2012-11-15 DIAGNOSIS — Z0289 Encounter for other administrative examinations: Secondary | ICD-10-CM

## 2012-12-06 ENCOUNTER — Other Ambulatory Visit: Payer: Self-pay | Admitting: Internal Medicine

## 2012-12-06 NOTE — Telephone Encounter (Signed)
Pt called requesting a refill on BENICAR 20 MG tablet Please advise if this can be done

## 2012-12-06 NOTE — Telephone Encounter (Signed)
Patient informed refill done 

## 2013-02-04 ENCOUNTER — Other Ambulatory Visit: Payer: Self-pay | Admitting: Internal Medicine

## 2013-02-11 ENCOUNTER — Ambulatory Visit (INDEPENDENT_AMBULATORY_CARE_PROVIDER_SITE_OTHER): Payer: BC Managed Care – PPO | Admitting: Internal Medicine

## 2013-02-11 ENCOUNTER — Telehealth: Payer: Self-pay

## 2013-02-11 ENCOUNTER — Other Ambulatory Visit (INDEPENDENT_AMBULATORY_CARE_PROVIDER_SITE_OTHER): Payer: BC Managed Care – PPO

## 2013-02-11 ENCOUNTER — Telehealth: Payer: Self-pay | Admitting: Internal Medicine

## 2013-02-11 ENCOUNTER — Encounter: Payer: Self-pay | Admitting: Internal Medicine

## 2013-02-11 VITALS — BP 120/76 | HR 102 | Temp 98.3°F | Ht 72.0 in | Wt 250.8 lb

## 2013-02-11 DIAGNOSIS — Z0000 Encounter for general adult medical examination without abnormal findings: Secondary | ICD-10-CM

## 2013-02-11 DIAGNOSIS — E119 Type 2 diabetes mellitus without complications: Secondary | ICD-10-CM

## 2013-02-11 DIAGNOSIS — J309 Allergic rhinitis, unspecified: Secondary | ICD-10-CM

## 2013-02-11 DIAGNOSIS — J209 Acute bronchitis, unspecified: Secondary | ICD-10-CM

## 2013-02-11 LAB — CBC WITH DIFFERENTIAL/PLATELET
Basophils Absolute: 0 10*3/uL (ref 0.0–0.1)
Basophils Relative: 0.3 % (ref 0.0–3.0)
Eosinophils Absolute: 0 10*3/uL (ref 0.0–0.7)
Hemoglobin: 15.1 g/dL (ref 13.0–17.0)
Lymphocytes Relative: 23.8 % (ref 12.0–46.0)
Lymphs Abs: 0.9 10*3/uL (ref 0.7–4.0)
MCHC: 34.3 g/dL (ref 30.0–36.0)
Neutro Abs: 2.6 10*3/uL (ref 1.4–7.7)
RBC: 4.79 Mil/uL (ref 4.22–5.81)
WBC: 3.9 10*3/uL — ABNORMAL LOW (ref 4.5–10.5)

## 2013-02-11 LAB — URINALYSIS, ROUTINE W REFLEX MICROSCOPIC
Bilirubin Urine: NEGATIVE
Ketones, ur: NEGATIVE
Specific Gravity, Urine: <=1.005 — AB (ref 1.000–1.030)
Total Protein, Urine: NEGATIVE
Urine Glucose: NEGATIVE
Urobilinogen, UA: 0.2 (ref 0.0–1.0)
WBC, UA: NONE SEEN (ref 0–?)
pH: 6.5 (ref 5.0–8.0)

## 2013-02-11 LAB — BASIC METABOLIC PANEL
BUN: 15 mg/dL (ref 6–23)
CO2: 30 mEq/L (ref 19–32)
Calcium: 9 mg/dL (ref 8.4–10.5)
Chloride: 98 mEq/L (ref 96–112)
Creatinine, Ser: 1.7 mg/dL — ABNORMAL HIGH (ref 0.4–1.5)
Glucose, Bld: 124 mg/dL — ABNORMAL HIGH (ref 70–99)

## 2013-02-11 LAB — HEMOGLOBIN A1C: Hgb A1c MFr Bld: 6.4 % (ref 4.6–6.5)

## 2013-02-11 LAB — LIPID PANEL
HDL: 44.7 mg/dL (ref >39.00)
LDL Cholesterol: 86 mg/dL (ref 0–99)
Total CHOL/HDL Ratio: 3
Triglycerides: 72 mg/dL (ref 0.0–149.0)
VLDL: 14.4 mg/dL (ref 0.0–40.0)

## 2013-02-11 LAB — MICROALBUMIN / CREATININE URINE RATIO: Microalb Creat Ratio: 0.6 mg/g (ref 0.0–30.0)

## 2013-02-11 LAB — HEPATIC FUNCTION PANEL
Bilirubin, Direct: 0.1 mg/dL (ref 0.0–0.3)
Total Bilirubin: 0.7 mg/dL (ref 0.3–1.2)
Total Protein: 7.1 g/dL (ref 6.0–8.3)

## 2013-02-11 MED ORDER — AZITHROMYCIN 250 MG PO TABS: ORAL_TABLET | ORAL | Status: DC

## 2013-02-11 MED ORDER — CEPHALEXIN 500 MG PO CAPS: 500.0000 mg | ORAL_CAPSULE | Freq: Four times a day (QID) | ORAL | Status: DC

## 2013-02-11 MED ORDER — HYDROCODONE-HOMATROPINE 5-1.5 MG/5ML PO SYRP: 5.0000 mL | ORAL_SOLUTION | Freq: Four times a day (QID) | ORAL | Status: DC | PRN

## 2013-02-11 MED ORDER — FLUTICASONE PROPIONATE 50 MCG/ACT NA SUSP: 2.0000 | Freq: Every day | NASAL | Status: DC

## 2013-02-11 NOTE — Telephone Encounter (Signed)
Received phone call from Dr. Pearletha Alfred Neurologist at [facility] who treats the patient for his myasthenia gravis.  The patient did inform Dr. Dominga Ferry of the cough and antibiotic prescribed today.  Dr. Dominga Ferry stated the cough medication was ok for him to take , but stated he does not recommend the z-pak and will inform the patient not to take.  Dr. Dominga Ferry stated he would be faxing information on antibiotics recommended for this patient.

## 2013-02-11 NOTE — Progress Notes (Signed)
Pre-visit discussion using our clinic review tool. No additional management support is needed unless otherwise documented below in the visit note.  

## 2013-02-11 NOTE — Addendum Note (Signed)
Addended by: Corwin Levins on: 02/11/2013 04:31 PM   Modules accepted: Orders, Medications

## 2013-02-11 NOTE — Progress Notes (Signed)
Subjective:    Patient ID: Miguel Powell, male    DOB: 1960-06-15, 52 y.o.   MRN: [id number]  HPI  Here for wellness and f/u;  Overall doing ok;  Pt denies CP, worsening SOB, DOE, wheezing, orthopnea, PND, worsening LE edema, palpitations, dizziness or syncope., except incidentally with Here with acute onset mild to mod 2-3 days ST, HA, general weakness and malaise, with prod cough greenish sputum   Pt denies neurological change such as new headache, facial or extremity weakness.  Pt denies polydipsia, polyuria, or low sugar symptoms. Pt states overall good compliance with treatment and medications, good tolerability, and has been trying to follow lower cholesterol diet.  Pt denies worsening depressive symptoms, suicidal ideation or panic. No fever, night sweats, wt loss, loss of appetite, or other constitutional symptoms.  Pt states good ability with ADL's, has low fall risk, home safety reviewed and adequate, no other significant changes in hearing or vision, and only occasionally active with exercise. Past Medical History  Diagnosis Date  . ERECTILE DYSFUNCTION 09/04/2006  . CARPAL TUNNEL SYNDROME, BILATERAL 09/04/2006  . Myasthenia gravis without exacerbation 05/16/2007  . CHALAZION, RIGHT 01/06/2008  . Myasthenia gravis   . ALLERGIC RHINITIS 09/04/2006  . ANXIETY 05/16/2007  . DIABETES MELLITUS, TYPE II 05/16/2007  . HYPERTENSION 09/04/2006  . RENAL INSUFFICIENCY 11/11/2009   Past Surgical History  Procedure Laterality Date  . Lumbar surgury  1987    reports that he has never smoked. He has never used smokeless tobacco. He reports that he drinks alcohol. He reports that he does not use illicit drugs. family history includes Aneurysm (age of onset: 29) in his brother; Cancer in his sister; Diabetes in his mother and sister; Hypertension in his sister. No Known Allergies Current Outpatient Prescriptions on File Prior to Visit  Medication Sig Dispense Refill  . amLODipine (NORVASC) 5 MG tablet  Take 1 tablet (5 mg total) by mouth daily.  90 tablet  3  . Ascorbic Acid (VITAMIN C) 500 MG tablet Take 500 mg by mouth 2 (two) times daily.        Marland Kitchen azaTHIOprine (IMURAN) 50 MG tablet Take 50 mg by mouth 3 (three) times daily.        Marland Kitchen BENICAR 20 MG tablet TAKE ONE TABLET BY MOUTH EVERY DAY  30 each  11  . BENICAR 20 MG tablet TAKE ONE TABLET BY MOUTH EVERY DAY  90 tablet  2  . beta carotene (BL BETA CAROTENE) 95621 UNIT capsule Take 25,000 Units by mouth daily.        . calcium-vitamin D (OSCAL 500/200 D-3) 500-200 MG-UNIT per tablet Take 1 tablet by mouth daily.        . fexofenadine (ALLEGRA) 180 MG tablet Take 1 tablet (180 mg total) by mouth daily.  90 tablet  3  . fluticasone (FLONASE) 50 MCG/ACT nasal spray Place 2 sprays into the nose daily.  16 g  6  . hydrochlorothiazide (MICROZIDE) 12.5 MG capsule TAKE ONE CAPSULE BY MOUTH IN THE MORNING  90 capsule  3  . predniSONE (DELTASONE) 20 MG tablet Take 20 mg by mouth daily.        Marland Kitchen pyridostigmine (MESTINON) 60 MG tablet Take 60 mg by mouth 3 (three) times daily.        Marland Kitchen VIAGRA 100 MG tablet TAKE ONE-HALF TO ONE TABLET BY MOUTH ONCE DAILY AS NEEDED FOR ERECTILE DYSFUNCTION  3 tablet  6  . vitamin E 200 UNIT capsule Take 200  Units by mouth daily.         No current facility-administered medications on file prior to visit.   Review of Systems Constitutional: Negative for diaphoresis, activity change, appetite change or unexpected weight change.  HENT: Negative for hearing loss, ear pain, facial swelling, mouth sores and neck stiffness.   Eyes: Negative for pain, redness and visual disturbance.  Respiratory: Negative for shortness of breath and wheezing.   Cardiovascular: Negative for chest pain and palpitations.  Gastrointestinal: Negative for diarrhea, blood in stool, abdominal distention or other pain Genitourinary: Negative for hematuria, flank pain or change in urine volume.  Musculoskeletal: Negative for myalgias and joint  swelling.  Skin: Negative for color change and wound.  Neurological: Negative for syncope and numbness. other than noted Hematological: Negative for adenopathy.  Psychiatric/Behavioral: Negative for hallucinations, self-injury, decreased concentration and agitation.      Objective:   Physical Exam BP 120/76  Pulse 102  Temp(Src) 98.3 F (36.8 C) (Oral)  Ht 6' (1.829 m)  Wt 250 lb 12 oz (113.739 kg)  BMI 34.00 kg/m2  SpO2 97% VS noted, mild ill Constitutional: Pt is oriented to person, place, and time. Appears well-developed and well-nourished.  Head: Normocephalic and atraumatic.  Right Ear: External ear normal.  Left Ear: External ear normal.  Nose: Nose normal.  Mouth/Throat: Oropharynx is clear and moist.  Bilat tm's with mild erythema.  Max sinus areas non tender.  Pharynx with mild erythema, no exudate Eyes: Conjunctivae and EOM are normal. Pupils are equal, round, and reactive to light.  Neck: Normal range of motion. Neck supple. No JVD present. No tracheal deviation present.  Cardiovascular: Normal rate, regular rhythm, normal heart sounds and intact distal pulses.   Pulmonary/Chest: Effort normal and breath sounds normal.  Abdominal: Soft. Bowel sounds are normal. There is no tenderness. No HSM  Musculoskeletal: Normal range of motion. Exhibits no edema.  Lymphadenopathy:  Has no cervical adenopathy.  Neurological: Pt is alert and oriented to person, place, and time. Pt has normal reflexes. No cranial nerve deficit.  Skin: Skin is warm and dry. No rash noted.  Psychiatric:  Has  normal mood and affect. Behavior is normal.     Assessment & Plan:

## 2013-02-11 NOTE — Telephone Encounter (Signed)
Called the patient left a detailed message new antibiotic sent in to pharmacy.

## 2013-02-11 NOTE — Telephone Encounter (Signed)
Recd records from [facility] Forwarding 2 pages to Dr.John

## 2013-02-11 NOTE — Telephone Encounter (Signed)
PCP did receive information from Dr. Dominga Ferry.  PCP did change antibiotic to cephalexin.  Called the patient to inform, had to leave a detailed message new rx sent in recommended by MD at [facility].

## 2013-02-11 NOTE — Assessment & Plan Note (Signed)
For flonase refill per pt request

## 2013-02-11 NOTE — Assessment & Plan Note (Signed)

## 2013-02-11 NOTE — Patient Instructions (Addendum)
Please take all new medication as prescribed - the antibiotic (cephalexin), and cough medicine Please continue all other medications as before, and refills have been done if requested - the flonase Please have the pharmacy call with any other refills you may need. Please continue your efforts at being more active, low cholesterol diet, and weight control. You are otherwise up to date with prevention measures today. Please keep your appointments with your specialists as you have planned - Neurology  Please sign a release of information form to get your more recent recent records from [facility]  Please go to the LAB in the Basement (turn left off the elevator) for the tests to be done today  You will be contacted by phone if any changes need to be made immediately.  Otherwise, you will receive a letter about your results with an explanation, but please check with MyChart first.  Please return in 6 months, or sooner if needed, with Lab testing done 3-5 days before

## 2013-02-11 NOTE — Assessment & Plan Note (Signed)
stable overall by history and exam, recent data reviewed with pt, and pt to continue medical treatment as before,  to f/u any worsening symptoms or concerns Lab Results  Component Value Date   HGBA1C 6.1 07/05/2009

## 2013-02-11 NOTE — Telephone Encounter (Signed)
02/11/2013  Pt came back into office stating that Dr. Dominga Ferry, neurologist, informed pt that the antibiotic that was prescribed will interact with the pt's current meds.  Please advise.  Pt says he will wait in the lobby.

## 2013-02-24 ENCOUNTER — Telehealth: Payer: Self-pay | Admitting: Internal Medicine

## 2013-02-24 MED ORDER — HYDROCODONE-HOMATROPINE 5-1.5 MG/5ML PO SYRP: 5.0000 mL | ORAL_SOLUTION | Freq: Four times a day (QID) | ORAL | Status: DC | PRN

## 2013-02-24 NOTE — Telephone Encounter (Signed)
Done hardcopy to robin  

## 2013-02-24 NOTE — Telephone Encounter (Signed)
Pt request written Rx for hycodan. Please call pt.

## 2013-02-25 NOTE — Telephone Encounter (Signed)
Called the patient LMOVM to pickup hardcopy at the front desk.

## 2013-04-04 ENCOUNTER — Telehealth: Payer: Self-pay | Admitting: Internal Medicine

## 2013-04-04 NOTE — Telephone Encounter (Signed)
rec'd from [employer] and [place] forward 3 pages to Dr. Jenny Reichmann

## 2013-04-11 ENCOUNTER — Telehealth: Payer: Self-pay | Admitting: Internal Medicine

## 2013-04-11 NOTE — Telephone Encounter (Signed)
This is not normally a medication with regular refills, but only needed for severe cough  Please consider OTC delsym

## 2013-04-11 NOTE — Telephone Encounter (Signed)
Patient is calling to request a new rx for HYDROcodone-homatropine (HYCODAN) 5-1.5 MG/5ML syrup. Please advise.

## 2013-04-12 NOTE — Telephone Encounter (Signed)
Called left the patient a detailed message of MD instructions. 

## 2013-04-30 ENCOUNTER — Telehealth: Payer: Self-pay | Admitting: Internal Medicine

## 2013-04-30 NOTE — Telephone Encounter (Signed)
Received 2 pages from [place], sent to Dr. Jenny Reichmann. 04/30/13/ss.

## 2013-07-10 ENCOUNTER — Telehealth: Payer: Self-pay | Admitting: *Deleted

## 2013-07-10 DIAGNOSIS — E039 Hypothyroidism, unspecified: Secondary | ICD-10-CM

## 2013-07-10 NOTE — Telephone Encounter (Signed)
Pt called requesting a referral to Belinda Fisher, MD for thyroid management.  Please advise

## 2013-07-10 NOTE — Telephone Encounter (Signed)
Done per emr 

## 2013-07-23 ENCOUNTER — Telehealth: Payer: Self-pay | Admitting: Internal Medicine

## 2013-07-23 DIAGNOSIS — E1165 Type 2 diabetes mellitus with hyperglycemia: Principal | ICD-10-CM

## 2013-07-23 DIAGNOSIS — IMO0001 Reserved for inherently not codable concepts without codable children: Secondary | ICD-10-CM

## 2013-07-23 NOTE — Telephone Encounter (Signed)
Referral done

## 2013-07-23 NOTE — Telephone Encounter (Signed)
PT CAME BY TO REQUEST REFERRAL TO DR O'Neill IN [place]. PT WANTS TO SEE AN ENDOCRINOLOGIST. PLEASE CONTACT PT ABOUT THIS REQUEST.

## 2013-08-01 ENCOUNTER — Encounter: Payer: Self-pay | Admitting: Podiatrist

## 2013-08-01 ENCOUNTER — Ambulatory Visit (INDEPENDENT_AMBULATORY_CARE_PROVIDER_SITE_OTHER): Payer: BC Managed Care – PPO | Admitting: Podiatrist

## 2013-08-01 VITALS — BP 135/92 | HR 84 | Resp 18

## 2013-08-01 DIAGNOSIS — M21619 Bunion of unspecified foot: Secondary | ICD-10-CM

## 2013-08-01 DIAGNOSIS — B351 Tinea unguium: Secondary | ICD-10-CM

## 2013-08-01 DIAGNOSIS — B353 Tinea pedis: Secondary | ICD-10-CM

## 2013-08-01 DIAGNOSIS — M21612 Bunion of left foot: Secondary | ICD-10-CM

## 2013-08-01 MED ORDER — ALUMINUM CHLORIDE 20 % EX SOLN: Freq: Two times a day (BID) | CUTANEOUS | Status: DC

## 2013-08-01 MED ORDER — NAFTIFINE HCL 2 % EX CREA: TOPICAL_CREAM | CUTANEOUS | Status: DC

## 2013-08-01 MED ORDER — EFINACONAZOLE 10 % EX SOLN: CUTANEOUS | Status: DC

## 2013-08-01 NOTE — Patient Instructions (Addendum)
Your medications have been called into a specialty pharmacy to be filled-- called [facility].  They will mail you the medication.  The antipersperant has been called into [facility] for you as well  Use the topical on the toenails for 3 months-- if you don't notice any change, please call and let me know-- I will call with the result of the toenail culture as well to be sure it is fungus that is growing in the toenail

## 2013-08-01 NOTE — Progress Notes (Addendum)
   Subjective:    Patient ID: Miguel Powell, male    DOB: Sep 15, 1960, 53 y.o.   MRN: [id number]  HPI look at all my nails and they are mainly on the 3rd toe on both feet and discolored and thick and has been going on for about 2 years and have been trimming on them    Review of Systems  Respiratory: Positive for cough.   Skin: Positive for rash.  Neurological: Positive for weakness.       Achilles tendon left foot  All other systems reviewed and are negative.      Objective:   Physical Exam Patient is awake, alert, and oriented x 3.  In no acute distress.  Vascular status is intact with palpable pedal pulses at 2/4 DP and PT bilateral and capillary refill time within normal limits. Neurological sensation is also intact bilaterally via Semmes Weinstein monofilament at 5/5 sites. Light touch, vibratory sensation, Achilles tendon reflex is intact.  Musculature intact with dorsiflexion, plantarflexion, inversion, eversion.  Bunion deformity is present on the left foot.  Right foot is within normal limits.     Fungal appearance is present to bilateral 3rd toenails, 2nd bilateral.  Interdigital maceration and tinea pedis is present 4th interspace bilateral.       Assessment & Plan:  Mycotic toenails, bunion  Plan:  Because of  medical history, patient is not a candidate for oral medication for the fungus. Recommended topical therapies.   If it becomes painful on a daily basis he will call and we will consider a bunion correction.

## 2013-08-08 ENCOUNTER — Telehealth: Payer: Self-pay | Admitting: Internal Medicine

## 2013-08-08 ENCOUNTER — Ambulatory Visit: Payer: BC Managed Care – PPO | Admitting: Internal Medicine

## 2013-08-08 DIAGNOSIS — Z0289 Encounter for other administrative examinations: Secondary | ICD-10-CM

## 2013-08-08 NOTE — Telephone Encounter (Signed)
Patient did not show for 6 month FU.  Right now no other appts are scheduled for Dr. Jenny Reichmann.  Please advise.

## 2013-08-08 NOTE — Telephone Encounter (Signed)
OK to always call patient to try to re-schedule, and let me know if patient has no showed x 3 for possible d/c from practice  

## 2013-08-14 ENCOUNTER — Ambulatory Visit: Payer: BC Managed Care – PPO | Admitting: Internal Medicine

## 2013-08-14 DIAGNOSIS — Z0289 Encounter for other administrative examinations: Secondary | ICD-10-CM

## 2013-08-17 ENCOUNTER — Other Ambulatory Visit: Payer: Self-pay | Admitting: Internal Medicine

## 2013-08-18 ENCOUNTER — Encounter: Payer: Self-pay | Admitting: Internal Medicine

## 2013-08-21 ENCOUNTER — Ambulatory Visit: Payer: BC Managed Care – PPO | Admitting: Internal Medicine

## 2013-08-25 ENCOUNTER — Other Ambulatory Visit: Payer: BC Managed Care – PPO

## 2013-08-29 ENCOUNTER — Ambulatory Visit (INDEPENDENT_AMBULATORY_CARE_PROVIDER_SITE_OTHER): Payer: BC Managed Care – PPO | Admitting: Podiatrist

## 2013-08-29 DIAGNOSIS — M21612 Bunion of left foot: Secondary | ICD-10-CM

## 2013-08-29 DIAGNOSIS — M21619 Bunion of unspecified foot: Secondary | ICD-10-CM

## 2013-08-29 NOTE — Progress Notes (Signed)
   Subjective:    Patient ID: Miguel Powell, male    DOB: [birth year], 53 y.o.   MRN: [id number]  HPI Comments: Pt presents for pick-up orthotics.  Oral and written orthotic wearing instructions were given.  Pt states will place the orthotics in his workshoe at home.  I encourage pt to make an appt as needed.     Review of Systems     Objective:   Physical Exam        Assessment & Plan:

## 2013-08-29 NOTE — Patient Instructions (Signed)

## 2013-09-02 ENCOUNTER — Ambulatory Visit: Payer: BC Managed Care – PPO | Admitting: Internal Medicine

## 2013-09-12 ENCOUNTER — Ambulatory Visit: Payer: BC Managed Care – PPO | Admitting: Internal Medicine

## 2013-09-16 ENCOUNTER — Telehealth: Payer: Self-pay | Admitting: *Deleted

## 2013-09-16 NOTE — Telephone Encounter (Signed)
I left messages for the patient to call me back for lab results.

## 2013-09-16 NOTE — Telephone Encounter (Signed)
I called and left him a message that the topical he is using is fine continue to use it.  Just wanted to let you know that Laser is another option.  Please call if you have any further questions on tomorrow.

## 2013-09-16 NOTE — Telephone Encounter (Signed)
I got a call from you all.  Give me a call back.

## 2013-09-18 ENCOUNTER — Encounter: Payer: Self-pay | Admitting: Internal Medicine

## 2013-09-18 ENCOUNTER — Ambulatory Visit (INDEPENDENT_AMBULATORY_CARE_PROVIDER_SITE_OTHER): Payer: BC Managed Care – PPO | Admitting: Internal Medicine

## 2013-09-18 ENCOUNTER — Other Ambulatory Visit (INDEPENDENT_AMBULATORY_CARE_PROVIDER_SITE_OTHER): Payer: BC Managed Care – PPO

## 2013-09-18 VITALS — BP 110/72 | HR 73 | Temp 98.2°F | Wt 253.0 lb

## 2013-09-18 DIAGNOSIS — E119 Type 2 diabetes mellitus without complications: Secondary | ICD-10-CM

## 2013-09-18 DIAGNOSIS — N259 Disorder resulting from impaired renal tubular function, unspecified: Secondary | ICD-10-CM

## 2013-09-18 DIAGNOSIS — J309 Allergic rhinitis, unspecified: Secondary | ICD-10-CM

## 2013-09-18 DIAGNOSIS — Z0000 Encounter for general adult medical examination without abnormal findings: Secondary | ICD-10-CM

## 2013-09-18 DIAGNOSIS — I10 Essential (primary) hypertension: Secondary | ICD-10-CM

## 2013-09-18 LAB — HEPATIC FUNCTION PANEL
ALK PHOS: 40 U/L (ref 39–117)
ALT: 17 U/L (ref 0–53)
AST: 22 U/L (ref 0–37)
Albumin: 4.3 g/dL (ref 3.5–5.2)
BILIRUBIN TOTAL: 0.9 mg/dL (ref 0.2–1.2)
Bilirubin, Direct: 0.1 mg/dL (ref 0.0–0.3)
Total Protein: 7.5 g/dL (ref 6.0–8.3)

## 2013-09-18 LAB — LIPID PANEL
CHOL/HDL RATIO: 3
CHOLESTEROL: 169 mg/dL (ref 0–200)
HDL: 56.2 mg/dL (ref >39.00)
LDL CALC: 93 mg/dL (ref 0–99)
NONHDL: 112.8
Triglycerides: 100 mg/dL (ref 0.0–149.0)
VLDL: 20 mg/dL (ref 0.0–40.0)

## 2013-09-18 LAB — BASIC METABOLIC PANEL
BUN: 13 mg/dL (ref 6–23)
CO2: 29 mEq/L (ref 19–32)
Calcium: 9.8 mg/dL (ref 8.4–10.5)
Chloride: 100 mEq/L (ref 96–112)
Creatinine, Ser: 1.5 mg/dL (ref 0.4–1.5)
GFR: 61.56 mL/min (ref >60.00)
GLUCOSE: 118 mg/dL — AB (ref 70–99)
Potassium: 3.6 mEq/L (ref 3.5–5.1)
SODIUM: 138 meq/L (ref 135–145)

## 2013-09-18 LAB — HEMOGLOBIN A1C: Hgb A1c MFr Bld: 6.5 % (ref 4.6–6.5)

## 2013-09-18 MED ORDER — SILDENAFIL CITRATE 100 MG PO TABS: ORAL_TABLET | ORAL | Status: DC

## 2013-09-18 NOTE — Progress Notes (Signed)
Subjective:    Patient ID: Miguel Powell, male    DOB: [birth year], 53 y.o.   MRN: [id number]  HPI   Here to f/u; overall doing ok,  Pt denies chest pain, increased sob or doe, wheezing, orthopnea, PND, increased LE swelling, palpitations, dizziness or syncope.  Pt denies polydipsia, polyuria, or low sugar symptoms such as weakness or confusion improved with po intake.  Pt denies new neurological symptoms such as new headache, or facial or extremity weakness or numbness.   Pt states overall good compliance with meds, has been trying to follow lower cholesterol, diabetic diet, with wt overall stable,  but little exercise however.  Has ongoing ED symptoms, asks for med refill.  Does have several wks ongoing nasal allergy symptoms with clearish congestion, itch and sneezing, without fever, pain, ST, cough, swelling or wheezing, despite current tx Past Medical History  Diagnosis Date  . ERECTILE DYSFUNCTION 09/04/2006  . CARPAL TUNNEL SYNDROME, BILATERAL 09/04/2006  . Myasthenia gravis without exacerbation 05/16/2007  . CHALAZION, RIGHT 01/06/2008  . Myasthenia gravis   . ALLERGIC RHINITIS 09/04/2006  . ANXIETY 05/16/2007  . DIABETES MELLITUS, TYPE II 05/16/2007  . HYPERTENSION 09/04/2006  . RENAL INSUFFICIENCY 11/11/2009   Past Surgical History  Procedure Laterality Date  . Lumbar surgury  1987    reports that he has never smoked. He has never used smokeless tobacco. He reports that he drinks alcohol. He reports that he does not use illicit drugs. family history includes Aneurysm (age of onset: 30) in his brother; Cancer in his sister; Diabetes in his mother and sister; Hypertension in his sister. Allergies  Allergen Reactions  . Azithromycin     Due to MG  . Botox [Botulinum Toxin Type A]     Due to MG  . Gentamycin [Gentamicin]     Due to MG  . Ketek [Telithromycin]     Due to MG  . Levaquin [Levofloxacin In D5w]     Due to MG  . Neomycin     Due to MG  . Procainamide     Due to MG  .  Quinine Derivatives     Due to MG   Current Outpatient Prescriptions on File Prior to Visit  Medication Sig Dispense Refill  . aluminum chloride (DRYSOL) 20 % external solution Apply topically 2 (two) times daily.  60 mL  3  . Ascorbic Acid (VITAMIN C) 500 MG tablet Take 500 mg by mouth 2 (two) times daily.        Marland Kitchen azaTHIOprine (IMURAN) 50 MG tablet Take 50 mg by mouth 3 (three) times daily.        Marland Kitchen BENICAR 20 MG tablet TAKE ONE TABLET BY MOUTH ONCE DAILY  90 tablet  3  . beta carotene (BL BETA CAROTENE) 14431 UNIT capsule Take 25,000 Units by mouth daily.        . calcium-vitamin D (OSCAL 500/200 D-3) 500-200 MG-UNIT per tablet Take 1 tablet by mouth daily.        . Efinaconazole 10 % SOLN Apply topically to the affected toenails daily  8 mL  3  . fexofenadine (ALLEGRA) 180 MG tablet Take 1 tablet (180 mg total) by mouth daily.  90 tablet  3  . fluticasone (FLONASE) 50 MCG/ACT nasal spray Place 2 sprays into both nostrils daily.  16 g  6  . hydrochlorothiazide (MICROZIDE) 12.5 MG capsule TAKE ONE CAPSULE BY MOUTH IN THE MORNING  90 capsule  3  . Naftifine HCl (NAFTIN)  2 % CREA Apply between toes 4,5 of bilateral feet daily for 2 weeks  60 g  2  . predniSONE (DELTASONE) 20 MG tablet Take 20 mg by mouth daily.       Marland Kitchen pyridostigmine (MESTINON) 60 MG tablet Take 60 mg by mouth 3 (three) times daily.        . vitamin E 200 UNIT capsule Take 200 Units by mouth daily.        Marland Kitchen amLODipine (NORVASC) 5 MG tablet Take 1 tablet (5 mg total) by mouth daily.  90 tablet  3   No current facility-administered medications on file prior to visit.   Review of Systems  Constitutional: Negative for unusual diaphoresis or other sweats  HENT: Negative for ringing in ear Eyes: Negative for double vision or worsening visual disturbance.  Respiratory: Negative for choking and stridor.   Gastrointestinal: Negative for vomiting or other signifcant bowel change Genitourinary: Negative for hematuria or decreased  urine volume.  Musculoskeletal: Negative for other MSK pain or swelling Skin: Negative for color change and worsening wound.  Neurological: Negative for tremors and numbness other than noted  Psychiatric/Behavioral: Negative for decreased concentration or agitation other than above       Objective:   Physical Exam BP 110/72  Pulse 73  Temp(Src) 98.2 F (36.8 C) (Oral)  Wt 253 lb (114.76 kg)  SpO2 95% VS noted,  Constitutional: Pt appears well-developed, well-nourished.  HENT: Head: NCAT.  Right Ear: External ear normal.  Left Ear: External ear normal.  Eyes: . Pupils are equal, round, and reactive to light. Conjunctivae and EOM are normal Bilat tm's with mild erythema.  Max sinus areas non tender.  Pharynx with mild erythema, no exudate Neck: Normal range of motion. Neck supple.  Cardiovascular: Normal rate and regular rhythm.   Pulmonary/Chest: Effort normal and breath sounds normal.  Neurological: Pt is alert. Not confused , motor grossly intact Skin: Skin is warm. No rash Psychiatric: Pt behavior is normal. No agitation.     Assessment & Plan:

## 2013-09-18 NOTE — Assessment & Plan Note (Signed)
To add zyrtec otc prn

## 2013-09-18 NOTE — Patient Instructions (Signed)
Please take all new medication as prescribed - the OTC Zyrtec daily, (but not the ZyrtecD)  Please continue all other medications as before, includiing the the flonase more on a daily basis to help the allergies and the ears stopping up  You can also take Delsym OTC for cough, and/or Mucinex (or it's generic off brand) for congestion, and tylenol as needed for pain.  Please have the pharmacy call with any other refills you may need.  Please continue your efforts at being more active, low cholesterol diet, and weight control.  Please keep your appointments with your specialists as you may have planned  Please go to the LAB in the Basement (turn left off the elevator) for the tests to be done today  You will be contacted by phone if any changes need to be made immediately.  Otherwise, you will receive a letter about your results with an explanation, but please check with MyChart first.  Please remember to sign up for MyChart if you have not done so, as this will be important to you in the future with finding out test results, communicating by private email, and scheduling acute appointments online when needed.  Please return in 6 months, or sooner if needed, with Lab testing done 3-5 days before

## 2013-09-18 NOTE — Progress Notes (Signed)
Pre visit review using our clinic review tool, if applicable. No additional management support is needed unless otherwise documented below in the visit note. 

## 2013-09-20 NOTE — Assessment & Plan Note (Signed)
stable overall by history and exam, recent data reviewed with pt, and pt to continue medical treatment as before,  to f/u any worsening symptoms or concerns Lab Results  Component Value Date   HGBA1C 6.5 09/18/2013

## 2013-09-20 NOTE — Assessment & Plan Note (Signed)
stable overall by history and exam, recent data reviewed with pt, and pt to continue medical treatment as before,  to f/u any worsening symptoms or concerns BP Readings from Last 3 Encounters:  09/18/13 110/72  08/01/13 135/92  02/11/13 120/76

## 2013-09-20 NOTE — Assessment & Plan Note (Signed)
stable overall by history and exam, recent data reviewed with pt, and pt to continue medical treatment as before,  to f/u any worsening symptoms or concerns Lab Results  Component Value Date   CREATININE 1.5 09/18/2013

## 2014-02-21 ENCOUNTER — Other Ambulatory Visit: Payer: Self-pay | Admitting: Internal Medicine

## 2014-03-25 ENCOUNTER — Ambulatory Visit: Payer: BC Managed Care – PPO | Admitting: Internal Medicine

## 2014-04-15 ENCOUNTER — Ambulatory Visit: Payer: Self-pay | Admitting: Podiatrist

## 2014-06-11 ENCOUNTER — Other Ambulatory Visit (INDEPENDENT_AMBULATORY_CARE_PROVIDER_SITE_OTHER): Payer: BLUE CROSS/BLUE SHIELD

## 2014-06-11 ENCOUNTER — Ambulatory Visit (INDEPENDENT_AMBULATORY_CARE_PROVIDER_SITE_OTHER): Payer: BLUE CROSS/BLUE SHIELD | Admitting: Internal Medicine

## 2014-06-11 ENCOUNTER — Encounter: Payer: Self-pay | Admitting: Internal Medicine

## 2014-06-11 VITALS — BP 128/82 | HR 73 | Temp 97.9°F | Resp 20 | Ht 72.0 in | Wt 244.0 lb

## 2014-06-11 DIAGNOSIS — E119 Type 2 diabetes mellitus without complications: Secondary | ICD-10-CM

## 2014-06-11 DIAGNOSIS — Z0000 Encounter for general adult medical examination without abnormal findings: Secondary | ICD-10-CM | POA: Diagnosis not present

## 2014-06-11 LAB — URINALYSIS, ROUTINE W REFLEX MICROSCOPIC
BILIRUBIN URINE: NEGATIVE
Ketones, ur: NEGATIVE
Leukocytes, UA: NEGATIVE
NITRITE: NEGATIVE
RBC / HPF: NONE SEEN (ref 0–?)
Specific Gravity, Urine: 1.015 (ref 1.000–1.030)
Total Protein, Urine: NEGATIVE
UROBILINOGEN UA: 0.2 (ref 0.0–1.0)
Urine Glucose: NEGATIVE
WBC UA: NONE SEEN (ref 0–?)
pH: 6 (ref 5.0–8.0)

## 2014-06-11 LAB — HEPATIC FUNCTION PANEL
ALK PHOS: 44 U/L (ref 39–117)
ALT: 12 U/L (ref 0–53)
AST: 15 U/L (ref 0–37)
Albumin: 4.1 g/dL (ref 3.5–5.2)
BILIRUBIN DIRECT: 0.1 mg/dL (ref 0.0–0.3)
BILIRUBIN TOTAL: 0.5 mg/dL (ref 0.2–1.2)
Total Protein: 6.6 g/dL (ref 6.0–8.3)

## 2014-06-11 LAB — CBC WITH DIFFERENTIAL/PLATELET
BASOS ABS: 0 10*3/uL (ref 0.0–0.1)
Basophils Relative: 0.7 % (ref 0.0–3.0)
EOS PCT: 1.7 % (ref 0.0–5.0)
Eosinophils Absolute: 0.1 10*3/uL (ref 0.0–0.7)
HCT: 40.3 % (ref 39.0–52.0)
Hemoglobin: 13.9 g/dL (ref 13.0–17.0)
Lymphocytes Relative: 49.7 % — ABNORMAL HIGH (ref 12.0–46.0)
Lymphs Abs: 2.1 10*3/uL (ref 0.7–4.0)
MCHC: 34.6 g/dL (ref 30.0–36.0)
MCV: 89.2 fl (ref 78.0–100.0)
MONOS PCT: 8.3 % (ref 3.0–12.0)
Monocytes Absolute: 0.3 10*3/uL (ref 0.1–1.0)
NEUTROS PCT: 39.6 % — AB (ref 43.0–77.0)
Neutro Abs: 1.6 10*3/uL (ref 1.4–7.7)
PLATELETS: 226 10*3/uL (ref 150.0–400.0)
RBC: 4.51 Mil/uL (ref 4.22–5.81)
RDW: 13.6 % (ref 11.5–15.5)
WBC: 4.1 10*3/uL (ref 4.0–10.5)

## 2014-06-11 LAB — MICROALBUMIN / CREATININE URINE RATIO
Creatinine,U: 102.6 mg/dL
Microalb Creat Ratio: 0.7 mg/g (ref 0.0–30.0)
Microalb, Ur: <0.7 mg/dL (ref 0.0–1.9)

## 2014-06-11 LAB — BASIC METABOLIC PANEL
BUN: 10 mg/dL (ref 6–23)
CALCIUM: 9.1 mg/dL (ref 8.4–10.5)
CHLORIDE: 102 meq/L (ref 96–112)
CO2: 31 meq/L (ref 19–32)
Creatinine, Ser: 1.16 mg/dL (ref 0.40–1.50)
GFR: 83.87 mL/min (ref >60.00)
GLUCOSE: 97 mg/dL (ref 70–99)
POTASSIUM: 3.5 meq/L (ref 3.5–5.1)
SODIUM: 138 meq/L (ref 135–145)

## 2014-06-11 LAB — LIPID PANEL
CHOL/HDL RATIO: 3
Cholesterol: 158 mg/dL (ref 0–200)
HDL: 59.3 mg/dL (ref >39.00)
LDL Cholesterol: 77 mg/dL (ref 0–99)
NonHDL: 98.7
TRIGLYCERIDES: 107 mg/dL (ref 0.0–149.0)
VLDL: 21.4 mg/dL (ref 0.0–40.0)

## 2014-06-11 LAB — HEMOGLOBIN A1C: HEMOGLOBIN A1C: 6.2 % (ref 4.6–6.5)

## 2014-06-11 LAB — TSH: TSH: 0.91 u[IU]/mL (ref 0.35–4.50)

## 2014-06-11 LAB — PSA: PSA: 1.63 ng/mL (ref 0.10–4.00)

## 2014-06-11 NOTE — Progress Notes (Signed)
Subjective:    Patient ID: Miguel Powell, male    DOB: 1960-03-12, 54 y.o.   MRN: [id number]  HPI  Here for wellness and f/u;  Overall doing ok;  Pt denies Chest pain, worsening SOB, DOE, wheezing, orthopnea, PND, worsening LE edema, palpitations, dizziness or syncope.  Pt denies neurological change such as new headache, facial or extremity weakness.  Pt denies polydipsia, polyuria, or low sugar symptoms. Pt states overall good compliance with treatment and medications, good tolerability, and has been trying to follow appropriate diet.  Pt denies worsening depressive symptoms, suicidal ideation or panic. No fever, night sweats, wt loss, loss of appetite, or other constitutional symptoms.  Pt states good ability with ADL's, has low fall risk, home safety reviewed and adequate, no other significant changes in hearing or vision, and only occasionally active with exercise.  S/p IVIG, wearing left eye patch waiting for left ptosis to improve. Pt continues to have recurring mild left LBP x 2 wks, no bowel or bladder change, fever, wt loss,  worsening LE pain/numbness/weakness, gait change or falls. No recent imaging. S/p l4-5 lumbar disc surgury.  Also s/p cyst removed to skin ant upper mid chest, benign early mar 2016, now healed. Has lost significant wt with better diet. Wt Readings from Last 3 Encounters:  06/11/14 244 lb 0.6 oz (110.696 kg)  09/18/13 253 lb (114.76 kg)  02/11/13 250 lb 12 oz (113.739 kg)   Past Medical History  Diagnosis Date  . ERECTILE DYSFUNCTION 09/04/2006  . CARPAL TUNNEL SYNDROME, BILATERAL 09/04/2006  . Myasthenia gravis without exacerbation 05/16/2007  . CHALAZION, RIGHT 01/06/2008  . Myasthenia gravis   . ALLERGIC RHINITIS 09/04/2006  . ANXIETY 05/16/2007  . DIABETES MELLITUS, TYPE II 05/16/2007  . HYPERTENSION 09/04/2006  . RENAL INSUFFICIENCY 11/11/2009   Past Surgical History  Procedure Laterality Date  . Lumbar surgury  1987    reports that he has never smoked. He has  never used smokeless tobacco. He reports that he drinks alcohol. He reports that he does not use illicit drugs. family history includes Aneurysm (age of onset: 65) in his brother; Cancer in his sister; Diabetes in his mother and sister; Hypertension in his sister. Allergies  Allergen Reactions  . Azithromycin     Due to MG  . Botox [Botulinum Toxin Type A]     Due to MG  . Gentamycin [Gentamicin]     Due to MG  . Ketek [Telithromycin]     Due to MG  . Levaquin [Levofloxacin In D5w]     Due to MG  . Neomycin     Due to MG  . Procainamide     Due to MG  . Quinine Derivatives     Due to MG   Current Outpatient Prescriptions on File Prior to Visit  Medication Sig Dispense Refill  . aluminum chloride (DRYSOL) 20 % external solution Apply topically 2 (two) times daily. 60 mL 3  . Ascorbic Acid (VITAMIN C) 500 MG tablet Take 500 mg by mouth 2 (two) times daily.      Marland Kitchen azaTHIOprine (IMURAN) 50 MG tablet Take 50 mg by mouth 3 (three) times daily.      Marland Kitchen BENICAR 20 MG tablet TAKE ONE TABLET BY MOUTH ONCE DAILY 90 tablet 3  . beta carotene (BL BETA CAROTENE) 62376 UNIT capsule Take 25,000 Units by mouth daily.      . calcium-vitamin D (OSCAL 500/200 D-3) 500-200 MG-UNIT per tablet Take 1 tablet by mouth daily.      Marland Kitchen  Efinaconazole 10 % SOLN Apply topically to the affected toenails daily 8 mL 3  . fexofenadine (ALLEGRA) 180 MG tablet Take 1 tablet (180 mg total) by mouth daily. 90 tablet 3  . fluticasone (FLONASE) 50 MCG/ACT nasal spray Place 2 sprays into both nostrils daily. 16 g 6  . hydrochlorothiazide (MICROZIDE) 12.5 MG capsule TAKE ONE CAPSULE BY MOUTH IN THE MORNING 90 capsule 2  . Naftifine HCl (NAFTIN) 2 % CREA Apply between toes 4,5 of bilateral feet daily for 2 weeks 60 g 2  . predniSONE (DELTASONE) 20 MG tablet Take 20 mg by mouth daily.     Marland Kitchen pyridostigmine (MESTINON) 60 MG tablet Take 60 mg by mouth 3 (three) times daily.      . sildenafil (VIAGRA) 100 MG tablet TAKE ONE-HALF  TO ONE TABLET BY MOUTH ONCE DAILY AS NEEDED FOR ERECTILE DYSFUNCTION 5 tablet 11  . vitamin E 200 UNIT capsule Take 200 Units by mouth daily.       No current facility-administered medications on file prior to visit.   Review of Systems Constitutional: Negative for increased diaphoresis, other activity, appetite or siginficant weight change other than noted HENT: Negative for worsening hearing loss, ear pain, facial swelling, mouth sores and neck stiffness.   Eyes: Negative for other worsening pain, redness or visual disturbance.  Respiratory: Negative for shortness of breath and wheezing  Cardiovascular: Negative for chest pain and palpitations.  Gastrointestinal: Negative for diarrhea, blood in stool, abdominal distention or other pain Genitourinary: Negative for hematuria, flank pain or change in urine volume.  Musculoskeletal: Negative for myalgias or other joint complaints.  Skin: Negative for color change and wound or drainage.  Neurological: Negative for syncope and numbness. other than noted Hematological: Negative for adenopathy. or other swelling Psychiatric/Behavioral: Negative for hallucinations, SI, self-injury, decreased concentration or other worsening agitation.      Objective:   Physical Exam BP 128/82 mmHg  Pulse 73  Temp(Src) 97.9 F (36.6 C) (Oral)  Resp 20  Ht 6' (1.829 m)  Wt 244 lb 0.6 oz (110.696 kg)  BMI 33.09 kg/m2  SpO2 94% VS noted,  Constitutional: Pt is oriented to person, place, and time. Appears well-developed and well-nourished, in no significant distress Head: Normocephalic and atraumatic.  Right Ear: External ear normal.  Left Ear: External ear normal.  Nose: Nose normal.  Mouth/Throat: Oropharynx is clear and moist.  Eyes: Conjunctivae and EOM are normal. Pupils are equal, round, and reactive to light.  Neck: Normal range of motion. Neck supple. No JVD present. No tracheal deviation present or significant neck LA or mass Cardiovascular:  Normal rate, regular rhythm, normal heart sounds and intact distal pulses.   Pulmonary/Chest: Effort normal and breath sounds without rales or wheezing  Abdominal: Soft. Bowel sounds are normal. NT. No HSM  Musculoskeletal: Normal range of motion. Exhibits no edema.  Lymphadenopathy:  Has no cervical adenopathy.  Neurological: Pt is alert and oriented to person, place, and time. Pt has normal reflexes. Except for left eye ptosis No cranial nerve deficit. Motor grossly intact Skin: Skin is warm and dry. No rash noted.  Psychiatric:  Has normal mood and affect. Behavior is normal.         Assessment & Plan:

## 2014-06-11 NOTE — Patient Instructions (Signed)
Please continue all other medications as before, and refills have been done if requested.  Please have the pharmacy call with any other refills you may need.  Please continue your efforts at being more active, low cholesterol diet, and weight control.  You are otherwise up to date with prevention measures today.  Please keep your appointments with your specialists as you may have planned  Please return in 6 months, or sooner if needed, with Lab testing done 3-5 days before  

## 2014-06-11 NOTE — Assessment & Plan Note (Signed)
stable overall by history and exam, recent data reviewed with pt, and pt to continue medical treatment as before,  to f/u any worsening symptoms or concerns Lab Results  Component Value Date   HGBA1C 6.5 09/18/2013   For f/u lab

## 2014-06-11 NOTE — Progress Notes (Signed)
Pre visit review using our clinic review tool, if applicable. No additional management support is needed unless otherwise documented below in the visit note. 

## 2014-06-11 NOTE — Assessment & Plan Note (Signed)

## 2014-07-31 ENCOUNTER — Other Ambulatory Visit: Payer: Self-pay | Admitting: Orthopedic Surgery

## 2014-07-31 DIAGNOSIS — M5416 Radiculopathy, lumbar region: Secondary | ICD-10-CM

## 2014-08-11 ENCOUNTER — Encounter: Payer: Self-pay | Admitting: Podiatry

## 2014-08-11 ENCOUNTER — Ambulatory Visit (INDEPENDENT_AMBULATORY_CARE_PROVIDER_SITE_OTHER): Payer: BLUE CROSS/BLUE SHIELD | Admitting: Podiatry

## 2014-08-11 VITALS — BP 159/92 | HR 62 | Resp 16

## 2014-08-11 DIAGNOSIS — B351 Tinea unguium: Secondary | ICD-10-CM

## 2014-08-11 DIAGNOSIS — M79673 Pain in unspecified foot: Secondary | ICD-10-CM | POA: Diagnosis not present

## 2014-08-12 NOTE — Progress Notes (Signed)
Subjective:     Patient ID: Miguel Powell, male   DOB: Aug 04, 1960, 54 y.o.   MRN: [id number]  HPI patient presents with thickened third nails of both feet which have become loose and they're painful and he cannot cut them himself. States it's gradually become more of an issue for him   Review of Systems  Neurological: Syncope: neurovascular status found to be intact with muscle strength adequate range of motion within normal limits. Patient's found to have severely dystrophic third nails bilateral that are thickened and loose and sore when pressed. States that they are becoming   All other systems reviewed and are negative.      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 He wants to have that done but he cannot do it today so I went ahead today and I debrided the nailbeds third bilateral which was tolerated well by the patient and he'll be seen back for permanent procedure

## 2014-08-21 ENCOUNTER — Encounter: Payer: BLUE CROSS/BLUE SHIELD | Admitting: Podiatry

## 2014-08-25 ENCOUNTER — Other Ambulatory Visit: Payer: BLUE CROSS/BLUE SHIELD

## 2014-09-09 ENCOUNTER — Other Ambulatory Visit: Payer: BLUE CROSS/BLUE SHIELD

## 2014-09-18 ENCOUNTER — Other Ambulatory Visit: Payer: Self-pay | Admitting: Internal Medicine

## 2014-09-18 NOTE — Telephone Encounter (Signed)
Left msg on triage requesting refill on benicar. Called pt back inform refill has been sent back to [facility].Marland KitchenJohny Chess

## 2014-09-23 ENCOUNTER — Ambulatory Visit
Admission: RE | Admit: 2014-09-23 | Discharge: 2014-09-23 | Disposition: A | Discharge status: Home / Self Care | Payer: BLUE CROSS/BLUE SHIELD | Source: Ambulatory Visit | Attending: Orthopedic Surgery | Admitting: Orthopedic Surgery

## 2014-09-23 DIAGNOSIS — M5416 Radiculopathy, lumbar region: Secondary | ICD-10-CM

## 2014-10-28 ENCOUNTER — Encounter: Payer: Self-pay | Admitting: Internal Medicine

## 2014-10-28 ENCOUNTER — Other Ambulatory Visit (INDEPENDENT_AMBULATORY_CARE_PROVIDER_SITE_OTHER): Payer: BLUE CROSS/BLUE SHIELD

## 2014-10-28 ENCOUNTER — Ambulatory Visit (INDEPENDENT_AMBULATORY_CARE_PROVIDER_SITE_OTHER): Payer: BLUE CROSS/BLUE SHIELD | Admitting: Internal Medicine

## 2014-10-28 VITALS — BP 118/82 | HR 83 | Temp 98.1°F | Ht 72.0 in | Wt 250.0 lb

## 2014-10-28 DIAGNOSIS — N259 Disorder resulting from impaired renal tubular function, unspecified: Secondary | ICD-10-CM

## 2014-10-28 DIAGNOSIS — M545 Low back pain, unspecified: Secondary | ICD-10-CM

## 2014-10-28 DIAGNOSIS — G7000 Myasthenia gravis without (acute) exacerbation: Secondary | ICD-10-CM | POA: Diagnosis not present

## 2014-10-28 DIAGNOSIS — I10 Essential (primary) hypertension: Secondary | ICD-10-CM | POA: Diagnosis not present

## 2014-10-28 LAB — BASIC METABOLIC PANEL
BUN: 13 mg/dL (ref 6–23)
CALCIUM: 9.7 mg/dL (ref 8.4–10.5)
CO2: 30 meq/L (ref 19–32)
CREATININE: 1.14 mg/dL (ref 0.40–1.50)
Chloride: 101 mEq/L (ref 96–112)
GFR: 85.46 mL/min (ref >60.00)
Glucose, Bld: 108 mg/dL — ABNORMAL HIGH (ref 70–99)
Potassium: 3.9 mEq/L (ref 3.5–5.1)
SODIUM: 138 meq/L (ref 135–145)

## 2014-10-28 NOTE — Patient Instructions (Signed)
Please continue all other medications as before, and refills have been done if requested.  You may wish to bring up the imuran dosing with neurology at your next visit  Please have the pharmacy call with any other refills you may need.  Please continue your efforts at being more active, low cholesterol diet, and weight control.  Please keep your appointments with your specialists as you may have planned  Please go to the LAB in the Basement (turn left off the elevator) for the tests to be done today  You will be contacted by phone if any changes need to be made immediately.  Otherwise, you will receive a letter about your results with an explanation, but please check with MyChart first.  Please remember to sign up for MyChart if you have not done so, as this will be important to you in the future with finding out test results, communicating by private email, and scheduling acute appointments online when needed.

## 2014-10-28 NOTE — Progress Notes (Signed)
Pre visit review using our clinic review tool, if applicable. No additional management support is needed unless otherwise documented below in the visit note. 

## 2014-10-28 NOTE — Progress Notes (Signed)
Subjective:    Patient ID: Miguel Powell, male    DOB: 19-Aug-1960, 54 y.o.   MRN: [id number]  HPI  Here to f/u,  States may be havimg side effects with right lbp and tingling with urination.  Has seen urology with neg exam, and has seen per Dr Jonni Sanger and has LS spine dz but pt states different pain.  Asks for labs today for renal fxn suggested per Dr Janice Norrie.  Has appt with Neurology sept 23 who rx the immuran, with the increase dose from 150 to 200 mg total per day. Still not work since mar 2016. Pt denies chest pain, increased sob or doe, wheezing, orthopnea, PND, increased LE swelling, palpitations, dizziness or syncope.  Pt denies new neurological symptoms such as new headache, or facial or extremity weakness or numbness . Pt continues to have recurring left LBP x 1-2 wks, no bowel or bladder change, fever, wt loss,  worsening LE pain/numbness/weakness, gait change or falls. Past Medical History  Diagnosis Date  . ERECTILE DYSFUNCTION 09/04/2006  . CARPAL TUNNEL SYNDROME, BILATERAL 09/04/2006  . Myasthenia gravis without exacerbation 05/16/2007  . CHALAZION, RIGHT 01/06/2008  . Myasthenia gravis   . ALLERGIC RHINITIS 09/04/2006  . ANXIETY 05/16/2007  . DIABETES MELLITUS, TYPE II 05/16/2007  . HYPERTENSION 09/04/2006  . RENAL INSUFFICIENCY 11/11/2009   Past Surgical History  Procedure Laterality Date  . Lumbar surgury  1987    reports that he has never smoked. He has never used smokeless tobacco. He reports that he drinks alcohol. He reports that he does not use illicit drugs. family history includes Aneurysm (age of onset: 24) in his brother; Cancer in his sister; Diabetes in his mother and sister; Hypertension in his sister. Allergies  Allergen Reactions  . Azithromycin     Due to MG  . Botox [Botulinum Toxin Type A]     Due to MG  . Gentamycin [Gentamicin]     Due to MG  . Ketek [Telithromycin]     Due to MG  . Levaquin [Levofloxacin In D5w]     Due to MG  . Neomycin     Due to  MG  . Procainamide     Due to MG  . Quinine Derivatives     Due to MG   Current Outpatient Prescriptions on File Prior to Visit  Medication Sig Dispense Refill  . aluminum chloride (DRYSOL) 20 % external solution Apply topically 2 (two) times daily. 60 mL 3  . Ascorbic Acid (VITAMIN C) 500 MG tablet Take 500 mg by mouth 2 (two) times daily.      Marland Kitchen BENICAR 20 MG tablet TAKE ONE TABLET BY MOUTH ONCE DAILY 90 tablet 1  . beta carotene (BL BETA CAROTENE) 45809 UNIT capsule Take 25,000 Units by mouth daily.      . calcium-vitamin D (OSCAL 500/200 D-3) 500-200 MG-UNIT per tablet Take 1 tablet by mouth daily.      . Efinaconazole 10 % SOLN Apply topically to the affected toenails daily 8 mL 3  . fexofenadine (ALLEGRA) 180 MG tablet Take 1 tablet (180 mg total) by mouth daily. 90 tablet 3  . fluticasone (FLONASE) 50 MCG/ACT nasal spray Place 2 sprays into both nostrils daily. 16 g 6  . hydrochlorothiazide (MICROZIDE) 12.5 MG capsule TAKE ONE CAPSULE BY MOUTH IN THE MORNING 90 capsule 2  . Naftifine HCl (NAFTIN) 2 % CREA Apply between toes 4,5 of bilateral feet daily for 2 weeks 60 g 2  . predniSONE (  DELTASONE) 20 MG tablet Take 20 mg by mouth daily.     Marland Kitchen pyridostigmine (MESTINON) 60 MG tablet Take 60 mg by mouth 3 (three) times daily.      . sildenafil (VIAGRA) 100 MG tablet TAKE ONE-HALF TO ONE TABLET BY MOUTH ONCE DAILY AS NEEDED FOR ERECTILE DYSFUNCTION 5 tablet 11  . vitamin E 200 UNIT capsule Take 200 Units by mouth daily.      Marland Kitchen azaTHIOprine (IMURAN) 50 MG tablet Take 50 mg by mouth 3 (three) times daily.       No current facility-administered medications on file prior to visit.   Review of Systems  Constitutional: Negative for unusual diaphoresis or night sweats HENT: Negative for ringing in ear or discharge Eyes: Negative for double vision or worsening visual disturbance.  Respiratory: Negative for choking and stridor.   Gastrointestinal: Negative for vomiting or other signifcant  bowel change Genitourinary: Negative for hematuria or change in urine volume.  Musculoskeletal: Negative for other MSK pain or swelling Skin: Negative for color change and worsening wound.  Neurological: Negative for tremors and numbness other than noted  Psychiatric/Behavioral: Negative for decreased concentration or agitation other than above       Objective:   Physical Exam BP 118/82 mmHg  Pulse 83  Temp(Src) 98.1 F (36.7 C) (Oral)  Ht 6' (1.829 m)  Wt 250 lb (113.399 kg)  BMI 33.90 kg/m2  SpO2 96% VS noted,  Constitutional: Pt appears in no significant distress HENT: Head: NCAT.  Right Ear: External ear normal.  Left Ear: External ear normal.  Eyes: . Pupils are equal, round, and reactive to light. Conjunctivae and EOM are normal Neck: Normal range of motion. Neck supple.  Cardiovascular: Normal rate and regular rhythm.   Pulmonary/Chest: Effort normal and breath sounds without rales or wheezing.  Abd:  Soft, NT, ND, + BS, no flank tender Spine nontender, mild left lumbar tender paravertebral without swelling or rash Neurological: Pt is alert. Not confused , motor grossly intact Skin: Skin is warm. No rash, no LE edema Psychiatric: Pt behavior is normal. No agitation.     Assessment & Plan:

## 2014-11-01 NOTE — Assessment & Plan Note (Signed)
stable overall by history and exam, recent data reviewed with pt, and pt to continue medical treatment as before,  to f/u any worsening symptoms or concerns BP Readings from Last 3 Encounters:  10/28/14 118/82  08/11/14 159/92  06/11/14 128/82

## 2014-11-01 NOTE — Assessment & Plan Note (Signed)
stable overall by history and exam, recent data reviewed with pt, and pt to continue medical treatment as before,  to f/u any worsening symptoms or concerns Lab Results  Component Value Date   WBC 4.1 06/11/2014   HGB 13.9 06/11/2014   HCT 40.3 06/11/2014   PLT 226.0 06/11/2014   GLUCOSE 108* 10/28/2014   CHOL 158 06/11/2014   TRIG 107.0 06/11/2014   HDL 59.30 06/11/2014   LDLCALC 77 06/11/2014   ALT 12 06/11/2014   AST 15 06/11/2014   NA 138 10/28/2014   K 3.9 10/28/2014   CL 101 10/28/2014   CREATININE 1.14 10/28/2014   BUN 13 10/28/2014   CO2 30 10/28/2014   TSH 0.91 06/11/2014   PSA 1.63 06/11/2014   HGBA1C 6.2 06/11/2014   MICROALBUR <0.7 06/11/2014

## 2014-11-01 NOTE — Assessment & Plan Note (Signed)
C/w msk strain, cont current tx, declines muscle relaxer prn,  to f/u any worsening symptoms or concerns

## 2014-11-01 NOTE — Assessment & Plan Note (Signed)
To f/u neurology regarding immuran and recent dosing change

## 2014-11-02 ENCOUNTER — Telehealth: Payer: Self-pay | Admitting: *Deleted

## 2014-11-02 NOTE — Telephone Encounter (Signed)
-----   Message from Biagio Borg, MD sent at 10/28/2014  4:08 PM EDT ----- Regarding: to forward labs plesae forward today labs when available  Dr Patricia Nettle at [facility] [phone] if needed

## 2014-11-02 NOTE — Telephone Encounter (Signed)
Called Dr. Lilly Cove # below his fax # [phone] fax labs per md request.../lmb

## 2014-11-12 ENCOUNTER — Other Ambulatory Visit: Payer: Self-pay | Admitting: Internal Medicine

## 2014-12-11 ENCOUNTER — Ambulatory Visit: Payer: BLUE CROSS/BLUE SHIELD | Admitting: Internal Medicine

## 2014-12-16 ENCOUNTER — Ambulatory Visit: Payer: BLUE CROSS/BLUE SHIELD | Admitting: Internal Medicine

## 2014-12-17 ENCOUNTER — Encounter: Payer: Self-pay | Admitting: Internal Medicine

## 2014-12-17 ENCOUNTER — Other Ambulatory Visit (INDEPENDENT_AMBULATORY_CARE_PROVIDER_SITE_OTHER): Payer: BLUE CROSS/BLUE SHIELD

## 2014-12-17 ENCOUNTER — Ambulatory Visit (INDEPENDENT_AMBULATORY_CARE_PROVIDER_SITE_OTHER): Payer: BLUE CROSS/BLUE SHIELD | Admitting: Internal Medicine

## 2014-12-17 ENCOUNTER — Other Ambulatory Visit: Payer: Self-pay | Admitting: Internal Medicine

## 2014-12-17 VITALS — BP 146/90 | HR 81 | Temp 98.1°F | Ht 72.0 in | Wt 255.0 lb

## 2014-12-17 DIAGNOSIS — I10 Essential (primary) hypertension: Secondary | ICD-10-CM

## 2014-12-17 DIAGNOSIS — Z20828 Contact with and (suspected) exposure to other viral communicable diseases: Secondary | ICD-10-CM | POA: Diagnosis not present

## 2014-12-17 DIAGNOSIS — J309 Allergic rhinitis, unspecified: Secondary | ICD-10-CM

## 2014-12-17 DIAGNOSIS — Z5181 Encounter for therapeutic drug level monitoring: Secondary | ICD-10-CM | POA: Diagnosis not present

## 2014-12-17 DIAGNOSIS — E119 Type 2 diabetes mellitus without complications: Secondary | ICD-10-CM

## 2014-12-17 DIAGNOSIS — Z0189 Encounter for other specified special examinations: Secondary | ICD-10-CM

## 2014-12-17 DIAGNOSIS — R1032 Left lower quadrant pain: Secondary | ICD-10-CM

## 2014-12-17 DIAGNOSIS — Z0000 Encounter for general adult medical examination without abnormal findings: Secondary | ICD-10-CM

## 2014-12-17 LAB — LIPID PANEL
CHOLESTEROL: 178 mg/dL (ref 0–200)
HDL: 60.5 mg/dL (ref >39.00)
LDL Cholesterol: 101 mg/dL — ABNORMAL HIGH (ref 0–99)
NonHDL: 117.21
TRIGLYCERIDES: 83 mg/dL (ref 0.0–149.0)
Total CHOL/HDL Ratio: 3
VLDL: 16.6 mg/dL (ref 0.0–40.0)

## 2014-12-17 LAB — CBC WITH DIFFERENTIAL/PLATELET
Basophils Absolute: 0 10*3/uL (ref 0.0–0.1)
Basophils Relative: 0.5 % (ref 0.0–3.0)
EOS ABS: 0 10*3/uL (ref 0.0–0.7)
Eosinophils Relative: 0.4 % (ref 0.0–5.0)
HCT: 42.3 % (ref 39.0–52.0)
Hemoglobin: 14.2 g/dL (ref 13.0–17.0)
Lymphocytes Relative: 28.4 % (ref 12.0–46.0)
Lymphs Abs: 1.3 10*3/uL (ref 0.7–4.0)
MCHC: 33.5 g/dL (ref 30.0–36.0)
MCV: 91.2 fl (ref 78.0–100.0)
MONO ABS: 0.3 10*3/uL (ref 0.1–1.0)
Monocytes Relative: 7.3 % (ref 3.0–12.0)
NEUTROS PCT: 63.4 % (ref 43.0–77.0)
Neutro Abs: 3 10*3/uL (ref 1.4–7.7)
Platelets: 279 10*3/uL (ref 150.0–400.0)
RBC: 4.64 Mil/uL (ref 4.22–5.81)
RDW: 13.6 % (ref 11.5–15.5)
WBC: 4.7 10*3/uL (ref 4.0–10.5)

## 2014-12-17 LAB — HEPATIC FUNCTION PANEL
ALK PHOS: 44 U/L (ref 39–117)
ALT: 16 U/L (ref 0–53)
AST: 21 U/L (ref 0–37)
Albumin: 4.2 g/dL (ref 3.5–5.2)
Bilirubin, Direct: 0.1 mg/dL (ref 0.0–0.3)
Total Bilirubin: 0.5 mg/dL (ref 0.2–1.2)
Total Protein: 7.4 g/dL (ref 6.0–8.3)

## 2014-12-17 LAB — BASIC METABOLIC PANEL
BUN: 13 mg/dL (ref 6–23)
CHLORIDE: 100 meq/L (ref 96–112)
CO2: 35 mEq/L — ABNORMAL HIGH (ref 19–32)
Calcium: 10 mg/dL (ref 8.4–10.5)
Creatinine, Ser: 1.12 mg/dL (ref 0.40–1.50)
GFR: 87.18 mL/min (ref >60.00)
Glucose, Bld: 109 mg/dL — ABNORMAL HIGH (ref 70–99)
POTASSIUM: 4.1 meq/L (ref 3.5–5.1)
SODIUM: 139 meq/L (ref 135–145)

## 2014-12-17 LAB — HEMOGLOBIN A1C: Hgb A1c MFr Bld: 6.3 % (ref 4.6–6.5)

## 2014-12-17 NOTE — Progress Notes (Signed)
Subjective:    Patient ID: Miguel Powell, male    DOB: 04/08/1960, 54 y.o.   MRN: [id number]  HPI  Here to f/u; overall doing ok,  Pt denies chest pain, increasing sob or doe, wheezing, orthopnea, PND, increased LE swelling, palpitations, dizziness or syncope.  Pt denies new neurological symptoms such as new headache, or facial or extremity weakness or numbness.  Pt denies polydipsia, polyuria, or low sugar episode.   Pt denies new neurological symptoms such as new headache, or facial or extremity weakness or numbness.   Pt states overall good compliance with meds, mostly trying to follow appropriate diet, with wt overall stable,  but little exercise however.  Also with mild sharp intermittent LLQ tender/soreness for several wks, worse to bend forward, locate where the belt buckle hits the abdomen, has gained several lbs recently   Denies urinary symptoms such as dysuria, frequency, urgency, flank pain, hematuria or n/v, fever, chills.  Denies worsening reflux, abd pain, dysphagia, n/v, bowel change or blood.  Also for azathioprine tx start, brings [facility] request for cbc/cmp check monthly for 11 months No fever Does have several wks ongoing nasal allergy symptoms with clearish congestion, itch and sneezing, without ST or worsening cough Past Medical History  Diagnosis Date  . ERECTILE DYSFUNCTION 09/04/2006  . CARPAL TUNNEL SYNDROME, BILATERAL 09/04/2006  . Myasthenia gravis without exacerbation ([place]) 05/16/2007  . CHALAZION, RIGHT 01/06/2008  . Myasthenia gravis ([place])   . ALLERGIC RHINITIS 09/04/2006  . ANXIETY 05/16/2007  . DIABETES MELLITUS, TYPE II 05/16/2007  . HYPERTENSION 09/04/2006  . RENAL INSUFFICIENCY 11/11/2009   Past Surgical History  Procedure Laterality Date  . Lumbar surgury  1987    reports that he has never smoked. He has never used smokeless tobacco. He reports that he drinks alcohol. He reports that he does not use illicit drugs. family history includes Aneurysm (age of onset:  75) in his brother; Cancer in his sister; Diabetes in his mother and sister; Hypertension in his sister. Allergies  Allergen Reactions  . Azithromycin     Due to MG  . Botox [Botulinum Toxin Type A]     Due to MG  . Gentamycin [Gentamicin]     Due to MG  . Ketek [Telithromycin]     Due to MG  . Levaquin [Levofloxacin In D5w]     Due to MG  . Neomycin     Due to MG  . Procainamide     Due to MG  . Quinine Derivatives     Due to MG   Current Outpatient Prescriptions on File Prior to Visit  Medication Sig Dispense Refill  . Ascorbic Acid (VITAMIN C) 500 MG tablet Take 500 mg by mouth 2 (two) times daily.      Marland Kitchen azaTHIOprine (IMURAN) 50 MG tablet Take 50 mg by mouth 3 (three) times daily.      Marland Kitchen BENICAR 20 MG tablet TAKE ONE TABLET BY MOUTH ONCE DAILY 90 tablet 1  . beta carotene (BL BETA CAROTENE) 69629 UNIT capsule Take 25,000 Units by mouth daily.      . calcium-vitamin D (OSCAL 500/200 D-3) 500-200 MG-UNIT per tablet Take 1 tablet by mouth daily.      . Efinaconazole 10 % SOLN Apply topically to the affected toenails daily 8 mL 3  . fexofenadine (ALLEGRA) 180 MG tablet Take 1 tablet (180 mg total) by mouth daily. 90 tablet 3  . fluticasone (FLONASE) 50 MCG/ACT nasal spray Place 2 sprays into  both nostrils daily. 16 g 6  . hydrochlorothiazide (MICROZIDE) 12.5 MG capsule TAKE ONE CAPSULE BY MOUTH IN THE MORNING 90 capsule 1  . predniSONE (DELTASONE) 20 MG tablet Take 20 mg by mouth daily.     Marland Kitchen pyridostigmine (MESTINON) 60 MG tablet Take 60 mg by mouth 3 (three) times daily.      Marland Kitchen aluminum chloride (DRYSOL) 20 % external solution Apply topically 2 (two) times daily. (Patient not taking: Reported on 12/17/2014) 60 mL 3  . Naftifine HCl (NAFTIN) 2 % CREA Apply between toes 4,5 of bilateral feet daily for 2 weeks (Patient not taking: Reported on 12/17/2014) 60 g 2  . sildenafil (VIAGRA) 100 MG tablet TAKE ONE-HALF TO ONE TABLET BY MOUTH ONCE DAILY AS NEEDED FOR ERECTILE DYSFUNCTION  (Patient not taking: Reported on 12/17/2014) 5 tablet 11  . vitamin E 200 UNIT capsule Take 200 Units by mouth daily.       No current facility-administered medications on file prior to visit.   Review of Systems  Constitutional: Negative for unusual diaphoresis or night sweats HENT: Negative for ringing in ear or discharge Eyes: Negative for double vision or worsening visual disturbance.  Respiratory: Negative for choking and stridor.   Gastrointestinal: Negative for vomiting or other signifcant bowel change Genitourinary: Negative for hematuria or change in urine volume.  Musculoskeletal: Negative for other MSK pain or swelling Skin: Negative for color change and worsening wound.  Neurological: Negative for tremors and numbness other than noted  Psychiatric/Behavioral: Negative for decreased concentration or agitation other than above       Objective:   Physical Exam BP 146/90 mmHg  Pulse 81  Temp(Src) 98.1 F (36.7 C) (Oral)  Ht 6' (1.829 m)  Wt 255 lb (115.667 kg)  BMI 34.58 kg/m2  SpO2 96% VS noted,  Constitutional: Pt appears in no significant distress HENT: Head: NCAT.  Right Ear: External ear normal.  Left Ear: External ear normal.  Eyes: . Pupils are equal, round, and reactive to light. Conjunctivae and EOM are normal Bilat tm's with mild erythema.  Max sinus areas non tender.  Pharynx with mild erythema, no exudate Neck: Normal range of motion. Neck supple.  Cardiovascular: Normal rate and regular rhythm.   Pulmonary/Chest: Effort normal and breath sounds without rales or wheezing.  Abd:  Soft, NT, ND, + BS, no flank tender Neurological: Pt is alert. Not confused , motor grossly intact Skin: Skin is warm. No rash, no LE edema Psychiatric: Pt behavior is normal. No agitation.     Assessment & Plan:

## 2014-12-17 NOTE — Patient Instructions (Signed)
Please continue all other medications as before, and refills have been done if requested.  OK to take the alleve for pain  We will fax your results to [facility]  Please have the pharmacy call with any other refills you may need.  Please continue your efforts at being more active, low cholesterol diet, and weight control.  Please keep your appointments with your specialists as you may have planned  Please return in 6 months, or sooner if needed, with Lab testing done 3-5 days before

## 2014-12-17 NOTE — Progress Notes (Signed)
Pre visit review using our clinic review tool, if applicable. No additional management support is needed unless otherwise documented below in the visit note. 

## 2014-12-18 DIAGNOSIS — R1032 Left lower quadrant pain: Secondary | ICD-10-CM | POA: Insufficient documentation

## 2014-12-18 DIAGNOSIS — Z5181 Encounter for therapeutic drug level monitoring: Secondary | ICD-10-CM | POA: Insufficient documentation

## 2014-12-18 NOTE — Assessment & Plan Note (Addendum)
stable overall by history and exam, recent data reviewed with pt, and pt to continue medical treatment as before,  to f/u any worsening symptoms or concerns Lab Results  Component Value Date   HGBA1C 6.3 12/17/2014  to cont to work on wt loss with recent increase on steroid tx Wt Readings from Last 3 Encounters:  12/17/14 255 lb (115.667 kg)  10/28/14 250 lb (113.399 kg)  06/11/14 244 lb 0.6 oz (110.696 kg)   Note:  Total time for pt hx, exam, review of record with pt in the room, determination of diagnoses and plan for further eval and tx is > 40 min, with over 50% spent in coordination and counseling of patient

## 2014-12-18 NOTE — Assessment & Plan Note (Signed)
Mild elevated today - ? Related to wt gain, pt declines med changes, o/w stable overall by history and exam, recent data reviewed with pt, and pt to continue medical treatment as before,  to f/u any worsening symptoms or concerns BP Readings from Last 3 Encounters:  12/17/14 146/90  10/28/14 118/82  08/11/14 159/92

## 2014-12-18 NOTE — Assessment & Plan Note (Signed)
Mild to mod, Ok to re-start the allegra/flonase asd,  to f/u any worsening symptoms or concerns

## 2014-12-18 NOTE — Assessment & Plan Note (Signed)
Exam benign, c/w msk most likely, ok to follow, doubt related to lumbar disc and referred pain as per pt questioning

## 2014-12-18 NOTE — Assessment & Plan Note (Signed)
Also for monthly cbc/cmp to be faxed to [place] [facility] monthly as well

## 2014-12-24 ENCOUNTER — Telehealth: Payer: Self-pay | Admitting: Internal Medicine

## 2014-12-24 NOTE — Telephone Encounter (Signed)
I wasn't aware that benicar came in generic  [facility] for staff to verify this with pharmacy please

## 2014-12-24 NOTE — Telephone Encounter (Signed)
Verified pharmacy is [facility] on [address]  Patient called to advise that BENICAR 20 MG tablet [id number] was prescribed, but [facility] filled a generic version. He states that it is not working. His current bp reading is 143/113. He requests that we send in a new script so that he can get name brand

## 2014-12-24 NOTE — Telephone Encounter (Signed)
[place] and talked with pharmacist---this is same pharmaceutical company that owns rights to the brand new generic version (or what's being called the generic) for benicar--pharmaceutical has introduced this as their generic version so that patient can purchase the same med at a lower cost---but the ingredients are exactly the same per the pharmacist at [facility]---i left voicemail for patient explaining this and if the patient doesn't understand to call me back and ask for [place] or if patient feels like his bp is not being controlled, call back and schedule office visit with dr john---he may need additional bp med because his blood pressure is now worse and benicar is no longer controlling bp as well as it did---i have also forwarded this to dr Jenny Reichmann, [facility] [place].Marland KitchenMarland Kitchen

## 2014-12-24 NOTE — Telephone Encounter (Signed)
Please advise, thanks.

## 2014-12-25 NOTE — Telephone Encounter (Signed)
Patient advised to continue on medication and chart his bp readings x3 daily at different times of day each day in order to get an accurate average of bp readings-for one week--patient is to call back and make office visit with dr Jenny Reichmann if bp continues to average above 140/90--patient repeated back for understanding

## 2014-12-25 NOTE — Telephone Encounter (Signed)
I would have to decline as this is normally not approved by his insurance company even after a PA.  I would expect the mediciatons to be similar effect, so I would continue to follow the BP for now, and we can reasess at next visit

## 2015-01-08 ENCOUNTER — Encounter: Payer: Self-pay | Admitting: Internal Medicine

## 2015-01-08 ENCOUNTER — Ambulatory Visit (INDEPENDENT_AMBULATORY_CARE_PROVIDER_SITE_OTHER): Payer: BLUE CROSS/BLUE SHIELD | Admitting: Internal Medicine

## 2015-01-08 VITALS — BP 158/90 | HR 95 | Temp 98.3°F | Ht 72.0 in | Wt 251.0 lb

## 2015-01-08 DIAGNOSIS — E119 Type 2 diabetes mellitus without complications: Secondary | ICD-10-CM

## 2015-01-08 DIAGNOSIS — I10 Essential (primary) hypertension: Secondary | ICD-10-CM | POA: Diagnosis not present

## 2015-01-08 DIAGNOSIS — F411 Generalized anxiety disorder: Secondary | ICD-10-CM

## 2015-01-08 MED ORDER — AMLODIPINE-OLMESARTAN 10-40 MG PO TABS: 1.0000 | ORAL_TABLET | Freq: Every day | ORAL | Status: DC

## 2015-01-08 MED ORDER — DIAZEPAM 5 MG PO TABS: ORAL_TABLET | ORAL | Status: DC

## 2015-01-08 NOTE — Patient Instructions (Signed)
Please take all new medication as prescribed - the generic azor 10/40 mg - 1 per day, but START TOMORROW as you have already taken the generic benicar 40 mg total yesterday and today  Please take all new medication as prescribed - the valium as needed for stress reaction  Please continue all other medications as before, and refills have been done if requested.  Please have the pharmacy call with any other refills you may need.  Please keep your appointments with your specialists as you may have planned  Please return in 3 weeks, or sooner if needed

## 2015-01-08 NOTE — Assessment & Plan Note (Signed)
Not well controlled even with addition of 20 mg benicar generic, ok for change to generic azor 10/40 - 1 per day, to start tomorrow (already took 40 mg benicar today) and f/u 3 wks

## 2015-01-08 NOTE — Progress Notes (Signed)
Pre visit review using our clinic review tool, if applicable. No additional management support is needed unless otherwise documented below in the visit note. 

## 2015-01-08 NOTE — Assessment & Plan Note (Signed)
stable overall by history and exam, recent data reviewed with pt, and pt to continue medical treatment as before,  to f/u any worsening symptoms or concerns Lab Results  Component Value Date   HGBA1C 6.3 12/17/2014

## 2015-01-08 NOTE — Assessment & Plan Note (Signed)
signficant issue today with exac of BP - for valium 2.5 qd prn limited rx short term use only hopefully, pt admits stress is a factor today; to cont exercise and wt loss

## 2015-01-08 NOTE — Progress Notes (Signed)
Subjective:    Patient ID: Miguel Powell, male    DOB: 01/25/1961, 54 y.o.   MRN: [id number]  HPI  Here to f/u with admitted anxiety and HTN; overall doing ok,  Pt denies chest pain, increasing sob or doe, wheezing, orthopnea, PND, increased LE swelling, palpitations, dizziness or syncope.  Pt denies new neurological symptoms such as new headache, or facial or extremity weakness or numbness.  Pt denies polydipsia, polyuria, or low sugar episode.   Pt denies new neurological symptoms such as new headache, or facial or extremity weakness or numbness.   Pt states overall good compliance with meds, mostly trying to follow appropriate diet, with wt overall down several lbs with daily excercise Wt Readings from Last 3 Encounters:  01/08/15 251 lb (113.853 kg)  12/17/14 255 lb (115.667 kg)  10/28/14 250 lb (113.399 kg)  BP has remained elevated , often checks four times per day since last seen, ave seems at least 150/100, though yesterday went to 180 with family causing him even more concern, couldn't sleep last night.  Pressure did come down with an emergency episode of walking 1 mile last eveniung Past Medical History  Diagnosis Date  . ERECTILE DYSFUNCTION 09/04/2006  . CARPAL TUNNEL SYNDROME, BILATERAL 09/04/2006  . Myasthenia gravis without exacerbation ([place]) 05/16/2007  . CHALAZION, RIGHT 01/06/2008  . Myasthenia gravis ([place])   . ALLERGIC RHINITIS 09/04/2006  . ANXIETY 05/16/2007  . DIABETES MELLITUS, TYPE II 05/16/2007  . HYPERTENSION 09/04/2006  . RENAL INSUFFICIENCY 11/11/2009   Past Surgical History  Procedure Laterality Date  . Lumbar surgury  1987    reports that he has never smoked. He has never used smokeless tobacco. He reports that he drinks alcohol. He reports that he does not use illicit drugs. family history includes Aneurysm (age of onset: 9) in his brother; Cancer in his sister; Diabetes in his mother and sister; Hypertension in his sister. Allergies  Allergen Reactions  .  Azithromycin     Due to MG  . Botox [Botulinum Toxin Type A]     Due to MG  . Gentamycin [Gentamicin]     Due to MG  . Ketek [Telithromycin]     Due to MG  . Levaquin [Levofloxacin In D5w]     Due to MG  . Neomycin     Due to MG  . Procainamide     Due to MG  . Quinine Derivatives     Due to MG   Current Outpatient Prescriptions on File Prior to Visit  Medication Sig Dispense Refill  . aluminum chloride (DRYSOL) 20 % external solution Apply topically 2 (two) times daily. 60 mL 3  . Ascorbic Acid (VITAMIN C) 500 MG tablet Take 500 mg by mouth 2 (two) times daily.      Marland Kitchen azaTHIOprine (IMURAN) 50 MG tablet Take 50 mg by mouth 3 (three) times daily.      Marland Kitchen BENICAR 20 MG tablet TAKE ONE TABLET BY MOUTH ONCE DAILY 90 tablet 1  . beta carotene (BL BETA CAROTENE) 60454 UNIT capsule Take 25,000 Units by mouth daily.      . calcium-vitamin D (OSCAL 500/200 D-3) 500-200 MG-UNIT per tablet Take 1 tablet by mouth daily.      . Efinaconazole 10 % SOLN Apply topically to the affected toenails daily 8 mL 3  . fexofenadine (ALLEGRA) 180 MG tablet Take 1 tablet (180 mg total) by mouth daily. 90 tablet 3  . fluticasone (FLONASE) 50 MCG/ACT nasal spray Place  2 sprays into both nostrils daily. 16 g 6  . hydrochlorothiazide (MICROZIDE) 12.5 MG capsule TAKE ONE CAPSULE BY MOUTH IN THE MORNING 90 capsule 1  . Naftifine HCl (NAFTIN) 2 % CREA Apply between toes 4,5 of bilateral feet daily for 2 weeks 60 g 2  . predniSONE (DELTASONE) 20 MG tablet Take 20 mg by mouth daily.     Marland Kitchen pyridostigmine (MESTINON) 60 MG tablet Take 60 mg by mouth 3 (three) times daily.      . sildenafil (VIAGRA) 100 MG tablet TAKE ONE-HALF TO ONE TABLET BY MOUTH ONCE DAILY AS NEEDED FOR ERECTILE DYSFUNCTION 5 tablet 11  . vitamin E 200 UNIT capsule Take 200 Units by mouth daily.       No current facility-administered medications on file prior to visit.     Review of Systems  Constitutional: Negative for unusual diaphoresis or  night sweats HENT: Negative for ringing in ear or discharge Eyes: Negative for double vision or worsening visual disturbance.  Respiratory: Negative for choking and stridor.   Gastrointestinal: Negative for vomiting or other signifcant bowel change Genitourinary: Negative for hematuria or change in urine volume.  Musculoskeletal: Negative for other MSK pain or swelling Skin: Negative for color change and worsening wound.  Neurological: Negative for tremors and numbness other than noted  Psychiatric/Behavioral: Negative for decreased concentration or agitation other than above       Objective:   Physical Exam BP 186/108 mmHg  Pulse 95  Temp(Src) 98.3 F (36.8 C) (Oral)  Ht 6' (1.829 m)  Wt 251 lb (113.853 kg)  BMI 34.03 kg/m2  SpO2 95%d F/u BP 158/90 left arm large cuff VS noted,  Constitutional: Pt appears in no significant distress HENT: Head: NCAT.  Right Ear: External ear normal.  Left Ear: External ear normal.  Eyes: . Pupils are equal, round, and reactive to light. Conjunctivae and EOM are normal Neck: Normal range of motion. Neck supple.  Cardiovascular: Normal rate and regular rhythm.   Pulmonary/Chest: Effort normal and breath sounds without rales or wheezing.  Abd:  Soft, NT, ND, + BS Neurological: Pt is alert. Not confused , motor grossly intact Skin: Skin is warm. No rash, no LE edema Psychiatric: Pt behavior is normal. No agitation.     Assessment & Plan:

## 2015-01-11 ENCOUNTER — Telehealth: Payer: Self-pay | Admitting: *Deleted

## 2015-01-11 MED ORDER — OLMESARTAN MEDOXOMIL 40 MG PO TABS: 40.0000 mg | ORAL_TABLET | Freq: Every day | ORAL | Status: DC

## 2015-01-11 NOTE — Telephone Encounter (Signed)
[place] for this - done erx  Please cont to monitor BP

## 2015-01-11 NOTE — Telephone Encounter (Signed)
Left msg on triage stating that saw MD on last Friday he rx combination BP med (Azor) pt stated he didn't realize the medication has amlodipine in it. 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 He stated that he had some Benicar 20 mg left he started taking 2, and his BP has been running ok 125-128/ 70-76. Pt is wanting md to rx benicar 40 mg.../lmb

## 2015-01-12 NOTE — Telephone Encounter (Signed)
Pt informed

## 2015-01-12 NOTE — Telephone Encounter (Signed)
Called pt no answer LMOM with md response. New rx has been sent to [facility]...Miguel Powell

## 2015-02-02 ENCOUNTER — Ambulatory Visit: Payer: BLUE CROSS/BLUE SHIELD | Admitting: Internal Medicine

## 2015-02-05 ENCOUNTER — Encounter: Payer: Self-pay | Admitting: Internal Medicine

## 2015-02-05 ENCOUNTER — Ambulatory Visit (INDEPENDENT_AMBULATORY_CARE_PROVIDER_SITE_OTHER): Payer: BLUE CROSS/BLUE SHIELD | Admitting: Internal Medicine

## 2015-02-05 ENCOUNTER — Other Ambulatory Visit (INDEPENDENT_AMBULATORY_CARE_PROVIDER_SITE_OTHER): Payer: BLUE CROSS/BLUE SHIELD

## 2015-02-05 VITALS — BP 142/98 | HR 101 | Temp 98.0°F | Ht 72.0 in | Wt 254.0 lb

## 2015-02-05 DIAGNOSIS — Z5181 Encounter for therapeutic drug level monitoring: Secondary | ICD-10-CM

## 2015-02-05 DIAGNOSIS — J069 Acute upper respiratory infection, unspecified: Secondary | ICD-10-CM | POA: Insufficient documentation

## 2015-02-05 DIAGNOSIS — E119 Type 2 diabetes mellitus without complications: Secondary | ICD-10-CM

## 2015-02-05 DIAGNOSIS — I10 Essential (primary) hypertension: Secondary | ICD-10-CM | POA: Diagnosis not present

## 2015-02-05 DIAGNOSIS — H906 Mixed conductive and sensorineural hearing loss, bilateral: Secondary | ICD-10-CM | POA: Insufficient documentation

## 2015-02-05 DIAGNOSIS — Z20828 Contact with and (suspected) exposure to other viral communicable diseases: Secondary | ICD-10-CM

## 2015-02-05 LAB — HEPATIC FUNCTION PANEL
ALBUMIN: 4.2 g/dL (ref 3.5–5.2)
ALK PHOS: 46 U/L (ref 39–117)
ALT: 20 U/L (ref 0–53)
AST: 24 U/L (ref 0–37)
Bilirubin, Direct: 0.1 mg/dL (ref 0.0–0.3)
Total Bilirubin: 0.3 mg/dL (ref 0.2–1.2)
Total Protein: 7.3 g/dL (ref 6.0–8.3)

## 2015-02-05 LAB — CBC WITH DIFFERENTIAL/PLATELET
Basophils Absolute: 0 10*3/uL (ref 0.0–0.1)
Basophils Relative: 0.7 % (ref 0.0–3.0)
EOS ABS: 0.1 10*3/uL (ref 0.0–0.7)
Eosinophils Relative: 2 % (ref 0.0–5.0)
HCT: 43.4 % (ref 39.0–52.0)
HEMOGLOBIN: 14.8 g/dL (ref 13.0–17.0)
LYMPHS PCT: 29.7 % (ref 12.0–46.0)
Lymphs Abs: 1.2 10*3/uL (ref 0.7–4.0)
MCHC: 34 g/dL (ref 30.0–36.0)
MCV: 91.3 fl (ref 78.0–100.0)
Monocytes Absolute: 0.3 10*3/uL (ref 0.1–1.0)
Monocytes Relative: 6.6 % (ref 3.0–12.0)
NEUTROS ABS: 2.4 10*3/uL (ref 1.4–7.7)
Neutrophils Relative %: 61 % (ref 43.0–77.0)
PLATELETS: 269 10*3/uL (ref 150.0–400.0)
RBC: 4.75 Mil/uL (ref 4.22–5.81)
RDW: 13.7 % (ref 11.5–15.5)
WBC: 4 10*3/uL (ref 4.0–10.5)

## 2015-02-05 LAB — BASIC METABOLIC PANEL
BUN: 13 mg/dL (ref 6–23)
CO2: 32 meq/L (ref 19–32)
Calcium: 9.6 mg/dL (ref 8.4–10.5)
Chloride: 100 mEq/L (ref 96–112)
Creatinine, Ser: 1.38 mg/dL (ref 0.40–1.50)
GFR: 68.48 mL/min (ref >60.00)
GLUCOSE: 164 mg/dL — AB (ref 70–99)
POTASSIUM: 3.9 meq/L (ref 3.5–5.1)
SODIUM: 140 meq/L (ref 135–145)

## 2015-02-05 LAB — HEPATITIS C ANTIBODY: HCV Ab: NEGATIVE

## 2015-02-05 LAB — HEMOGLOBIN A1C: Hgb A1c MFr Bld: 6.3 % (ref 4.6–6.5)

## 2015-02-05 MED ORDER — METOPROLOL SUCCINATE ER 25 MG PO TB24: 25.0000 mg | ORAL_TABLET | Freq: Every day | ORAL | Status: DC

## 2015-02-05 MED ORDER — OLMESARTAN MEDOXOMIL-HCTZ 40-12.5 MG PO TABS: 1.0000 | ORAL_TABLET | Freq: Every day | ORAL | Status: DC

## 2015-02-05 NOTE — Assessment & Plan Note (Signed)
C/w viral most likely, afeb and exam with mild findings only,  For cough med prn, mucinex prn

## 2015-02-05 NOTE — Progress Notes (Signed)
Subjective:    Patient ID: Miguel Powell, male    DOB: 29-Jan-1961, 54 y.o.   MRN: [id number]  HPI  Hee to f/u, did not take the azor as was advised to not take CCB with his other current meds including the imuran; o/w Pt denies chest pain, increasing sob or doe, wheezing, orthopnea, PND, increased LE swelling, palpitations, dizziness or syncope.  Pt denies new neurological symptoms such as new headache, or facial or extremity weakness or numbness.  Pt denies polydipsia, polyuria, or low sugar episode.   Pt denies new neurological symptoms such as new headache, or facial or extremity weakness or numbness.   Pt states overall good compliance with meds, mostly trying to follow appropriate diet, with wt overall stable,  but little exercise however.Wt remains a significant problem.  BP remains elevated on benicar 40 mg BP Readings from Last 3 Encounters:  02/05/15 142/98  01/08/15 158/90  12/17/14 146/90  Also incidentally with bilat hearing loss in the past wk,  Wax related.  No pain, dizziness, but has had also  Here with 2-3 days acute onset sinus pressure, headache, general weakness and malaise, and clearish d/c, with mild ST and non prod cough worse at night . No fever  Past Medical History  Diagnosis Date  . ERECTILE DYSFUNCTION 09/04/2006  . CARPAL TUNNEL SYNDROME, BILATERAL 09/04/2006  . Myasthenia gravis without exacerbation ([place]) 05/16/2007  . CHALAZION, RIGHT 01/06/2008  . Myasthenia gravis ([place])   . ALLERGIC RHINITIS 09/04/2006  . ANXIETY 05/16/2007  . DIABETES MELLITUS, TYPE II 05/16/2007  . HYPERTENSION 09/04/2006  . RENAL INSUFFICIENCY 11/11/2009   Past Surgical History  Procedure Laterality Date  . Lumbar surgury  1987    reports that he has never smoked. He has never used smokeless tobacco. He reports that he drinks alcohol. He reports that he does not use illicit drugs. family history includes Aneurysm (age of onset: 65) in his brother; Cancer in his sister; Diabetes in his mother  and sister; Hypertension in his sister. Allergies  Allergen Reactions  . Azithromycin     Due to MG  . Botox [Botulinum Toxin Type A]     Due to MG  . Gentamycin [Gentamicin]     Due to MG  . Ketek [Telithromycin]     Due to MG  . Levaquin [Levofloxacin In D5w]     Due to MG  . Neomycin     Due to MG  . Procainamide     Due to MG  . Quinine Derivatives     Due to MG   Current Outpatient Prescriptions on File Prior to Visit  Medication Sig Dispense Refill  . aluminum chloride (DRYSOL) 20 % external solution Apply topically 2 (two) times daily. 60 mL 3  . Ascorbic Acid (VITAMIN C) 500 MG tablet Take 500 mg by mouth 2 (two) times daily.      Marland Kitchen azaTHIOprine (IMURAN) 50 MG tablet Take 50 mg by mouth 3 (three) times daily.      . beta carotene (BL BETA CAROTENE) 16109 UNIT capsule Take 25,000 Units by mouth daily.      . calcium-vitamin D (OSCAL 500/200 D-3) 500-200 MG-UNIT per tablet Take 1 tablet by mouth daily.      . diazepam (VALIUM) 5 MG tablet 1/2 - 1 tab by mouth once daily as needed for nerves 30 tablet 0  . Efinaconazole 10 % SOLN Apply topically to the affected toenails daily 8 mL 3  . fexofenadine (ALLEGRA) 180  MG tablet Take 1 tablet (180 mg total) by mouth daily. 90 tablet 3  . fluticasone (FLONASE) 50 MCG/ACT nasal spray Place 2 sprays into both nostrils daily. 16 g 6  . hydrochlorothiazide (MICROZIDE) 12.5 MG capsule TAKE ONE CAPSULE BY MOUTH IN THE MORNING 90 capsule 1  . Naftifine HCl (NAFTIN) 2 % CREA Apply between toes 4,5 of bilateral feet daily for 2 weeks 60 g 2  . olmesartan (BENICAR) 40 MG tablet Take 1 tablet (40 mg total) by mouth daily. 90 tablet 3  . predniSONE (DELTASONE) 20 MG tablet Take 20 mg by mouth daily.     Marland Kitchen pyridostigmine (MESTINON) 60 MG tablet Take 60 mg by mouth 3 (three) times daily.      . sildenafil (VIAGRA) 100 MG tablet TAKE ONE-HALF TO ONE TABLET BY MOUTH ONCE DAILY AS NEEDED FOR ERECTILE DYSFUNCTION 5 tablet 11  . vitamin E 200 UNIT  capsule Take 200 Units by mouth daily.       No current facility-administered medications on file prior to visit.    Review of Systems  Constitutional: Negative for unusual diaphoresis or night sweats HENT: Negative for ringing in ear or discharge Eyes: Negative for double vision or worsening visual disturbance.  Respiratory: Negative for choking and stridor.   Gastrointestinal: Negative for vomiting or other signifcant bowel change Genitourinary: Negative for hematuria or change in urine volume.  Musculoskeletal: Negative for other MSK pain or swelling Skin: Negative for color change and worsening wound.  Neurological: Negative for tremors and numbness other than noted  Psychiatric/Behavioral: Negative for decreased concentration or agitation other than above       Objective:   Physical Exam BP 142/98 mmHg  Pulse 101  Temp(Src) 98 F (36.7 C) (Oral)  Ht 6' (1.829 m)  Wt 254 lb (115.214 kg)  BMI 34.44 kg/m2  SpO2 96% VS noted,  Constitutional: Pt appears in no significant distress HENT: Head: NCAT.  Right Ear: External ear normal.  Left Ear: External ear normal.  bilat ear wax improved with wax irrigation Bilat tm's with mild erythema.  Max sinus areas non tender.  Pharynx with mild erythema, no exudate Eyes: . Pupils are equal, round, and reactive to light. Conjunctivae and EOM are normal Neck: Normal range of motion. Neck supple.  Cardiovascular: Normal rate and regular rhythm.   Pulmonary/Chest: Effort normal and breath sounds without rales or wheezing.  Neurological: Pt is alert. Not confused , motor grossly intact Skin: Skin is warm. No rash, no LE edema Psychiatric: Pt behavior is normal. No agitation.     Assessment & Plan:

## 2015-02-05 NOTE — Progress Notes (Signed)
Pre visit review using our clinic review tool, if applicable. No additional management support is needed unless otherwise documented below in the visit note. 

## 2015-02-05 NOTE — Assessment & Plan Note (Signed)
Ok to change the current benicar and hct to benicar hct 40/12.5, also add toprol XL 25 qd, for contd BP and HR monitoring at home as he does, to call in 7-10 days with results

## 2015-02-05 NOTE — Assessment & Plan Note (Signed)
stable overall by history and exam, recent data reviewed with pt, and pt to continue medical treatment as before,  to f/u any worsening symptoms or concerns Lab Results  Component Value Date   HGBA1C 6.3 12/17/2014

## 2015-02-05 NOTE — Patient Instructions (Signed)
Please take all new medication as prescribed - the cough medicine as needed  You can also take Mucinex (or it's generic off brand) for congestion, and tylenol as needed for pain.  OK to stop the Benicar 40 mg, and stop the HCT fluid pill, and do not take the amlodipine (norvasc) or azor as you have already not done  Please take all new medication as prescribed - the benicar HCT 40/12.5 mg per day (two medications in one pill), and the brand new one called Toprol XL 25 mg per day (also called metoprolol ER)  Please continue to monitor your Blood Pressures at home as you have been doing, and call in 7-10 days with the results of the  Blood Pressure AND the heart rates with your home monitor  We may need to increase the toprol Xl if the BP is still elevated more than 140/90 (and the Heart Rate more than 60)  Please continue all other medications as before, and refills have been done if requested.  Please have the pharmacy call with any other refills you may need.  Please continue your efforts at being more active, low cholesterol diet, and weight control.  Please keep your appointments with your specialists as you may have planned  Please return in 3 months, or sooner if needed

## 2015-02-05 NOTE — Assessment & Plan Note (Signed)
Improved with irrigation,  to f/u any worsening symptoms or concerns  

## 2015-02-17 ENCOUNTER — Telehealth: Payer: Self-pay | Admitting: Internal Medicine

## 2015-02-17 NOTE — Telephone Encounter (Signed)
[facility]  Patient Name: Miguel Powell  DOB: 1960-06-10    Initial Comment Caller states he just stepped on a rusty nail. It went through the heel of his tennis shoe.    Nurse Assessment  Nurse: Harlow Mares, RN, Suanne Marker Date/Time (Eastern Time): 02/17/2015 4:38:56 PM  Confirm and document reason for call. If symptomatic, describe symptoms. ---Caller states he just stepped on a rusty nail. It went through the heel of his tennis shoe. Didn't break the skin. He is currently washing it now. No obvious bleeding or puncture wound, but caller reports that he can't be sure. Calling to check that his tetanus shot in the past 10 yrs. Reports a slight bruise.  Has the patient traveled out of the country within the last 30 days? ---No  Does the patient have any new or worsening symptoms? ---Yes  Will a triage be completed? ---Yes  Related visit to physician within the last 2 weeks? ---Yes  Does the PT have any chronic conditions? (i.e. diabetes, asthma, etc.) ---Yes  List chronic conditions. ---hypertension; myasthenia gravis;  Is this a behavioral health or substance abuse call? ---No     Guidelines    Guideline Title Affirmed Question Affirmed Notes  Puncture Wound [1] Puncture wound of foot AND [2] puncture went through shoe (e.g., tennis shoe)    Final Disposition User   See PCP When Office is Open (within 3 days) Harlow Mares, RN, Rhonda    Referrals  REFERRED TO PCP OFFICE   Disagree/Comply: Leta Baptist

## 2015-02-18 ENCOUNTER — Ambulatory Visit (INDEPENDENT_AMBULATORY_CARE_PROVIDER_SITE_OTHER): Payer: Managed Care, Other (non HMO) | Admitting: Family Medicine

## 2015-02-18 ENCOUNTER — Encounter: Payer: Self-pay | Admitting: Family Medicine

## 2015-02-18 VITALS — BP 132/88 | HR 95 | Temp 97.8°F | Ht 72.0 in | Wt 260.5 lb

## 2015-02-18 DIAGNOSIS — I10 Essential (primary) hypertension: Secondary | ICD-10-CM

## 2015-02-18 DIAGNOSIS — M79673 Pain in unspecified foot: Secondary | ICD-10-CM

## 2015-02-18 NOTE — Progress Notes (Signed)
Pre visit review using our clinic review tool, if applicable. No additional management support is needed unless otherwise documented below in the visit note. 

## 2015-02-18 NOTE — Progress Notes (Signed)
HPI:  Acute visit for:  Stepped on tack: -reports: stepped on tack that went through R shoe yesterday he does not think if broke the skin but he washed area on foot for 15 minutes with soap and water anyways -denies:bleeding, redness of skin, wound, pain -has had all vaccines including regular tetanus boosters per his report,   with last dose < 5 years ago with Tdap in his chart - he could not remember last dose and wanted to make sure was utd on tetanus booster  HTN: -reports PCP is working on BP meds, wanted to check BP today  ROS: See pertinent positives and negatives per HPI.  Past Medical History  Diagnosis Date  . ERECTILE DYSFUNCTION 09/04/2006  . CARPAL TUNNEL SYNDROME, BILATERAL 09/04/2006  . Myasthenia gravis without exacerbation ([place]) 05/16/2007  . CHALAZION, RIGHT 01/06/2008  . Myasthenia gravis ([place])   . ALLERGIC RHINITIS 09/04/2006  . ANXIETY 05/16/2007  . DIABETES MELLITUS, TYPE II 05/16/2007  . HYPERTENSION 09/04/2006  . RENAL INSUFFICIENCY 11/11/2009    Past Surgical History  Procedure Laterality Date  . Lumbar surgury  1987    Family History  Problem Relation Age of Onset  . Diabetes Mother   . Diabetes Sister   . Cancer Sister     pancreatic  . Hypertension Sister   . Aneurysm Brother 67    ruptured aneurysm    Social History   Social History  . Marital Status: Single    Spouse Name: N/A  . Number of Children: N/A  . Years of Education: N/A   Social History Main Topics  . Smoking status: Never Smoker   . Smokeless tobacco: Never Used  . Alcohol Use: Yes     Comment: social  . Drug Use: No  . Sexual Activity: Not Asked   Other Topics Concern  . None   Social History Narrative     Current outpatient prescriptions:  .  aluminum chloride (DRYSOL) 20 % external solution, Apply topically 2 (two) times daily., Disp: 60 mL, Rfl: 3 .  Ascorbic Acid (VITAMIN C) 500 MG tablet, Take 500 mg by mouth 2 (two) times daily.  , Disp: , Rfl:  .   azaTHIOprine (IMURAN) 50 MG tablet, Take 50 mg by mouth 3 (three) times daily.  , Disp: , Rfl:  .  beta carotene (BL BETA CAROTENE) 60454 UNIT capsule, Take 25,000 Units by mouth daily.  , Disp: , Rfl:  .  calcium-vitamin D (OSCAL 500/200 D-3) 500-200 MG-UNIT per tablet, Take 1 tablet by mouth daily.  , Disp: , Rfl:  .  diazepam (VALIUM) 5 MG tablet, 1/2 - 1 tab by mouth once daily as needed for nerves, Disp: 30 tablet, Rfl: 0 .  Efinaconazole 10 % SOLN, Apply topically to the affected toenails daily, Disp: 8 mL, Rfl: 3 .  fexofenadine (ALLEGRA) 180 MG tablet, Take 1 tablet (180 mg total) by mouth daily., Disp: 90 tablet, Rfl: 3 .  fluticasone (FLONASE) 50 MCG/ACT nasal spray, Place 2 sprays into both nostrils daily., Disp: 16 g, Rfl: 6 .  metoprolol succinate (TOPROL-XL) 25 MG 24 hr tablet, Take 1 tablet (25 mg total) by mouth daily., Disp: 90 tablet, Rfl: 3 .  Naftifine HCl (NAFTIN) 2 % CREA, Apply between toes 4,5 of bilateral feet daily for 2 weeks, Disp: 60 g, Rfl: 2 .  olmesartan-hydrochlorothiazide (BENICAR HCT) 40-12.5 MG tablet, Take 1 tablet by mouth daily., Disp: 90 tablet, Rfl: 3 .  predniSONE (DELTASONE) 20 MG tablet, Take  20 mg by mouth daily. , Disp: , Rfl:  .  pyridostigmine (MESTINON) 60 MG tablet, Take 60 mg by mouth 3 (three) times daily.  , Disp: , Rfl:  .  sildenafil (VIAGRA) 100 MG tablet, TAKE ONE-HALF TO ONE TABLET BY MOUTH ONCE DAILY AS NEEDED FOR ERECTILE DYSFUNCTION, Disp: 5 tablet, Rfl: 11 .  vitamin E 200 UNIT capsule, Take 200 Units by mouth daily.  , Disp: , Rfl:   EXAM:  Filed Vitals:   02/18/15 1542  BP: 132/88  Pulse: 95  Temp: 97.8 F (36.6 C)    Body mass index is 35.32 kg/(m^2).  GENERAL: vitals reviewed and listed above, alert, oriented, appears well hydrated and in no acute distress  SKIN: no wound of foot in question - area where tack went through shoe is in area of thickened somewhat calloused skin on heal - this is no wound present on exam, no  bruising, redness or any signs of puncture or trauma  MS: moves all extremities without noticeable abnormality  PSYCH: pleasant and cooperative, no obvious depression or anxiety  ASSESSMENT AND PLAN:  Discussed the following assessment and plan:  Foot pain, unspecified laterality  Essential hypertension  -no wound on foot, seems has had > 3 tetanus vaiccines and last Tdap in < 5 years -BP ok today, he plans to follow with PCP -Patient advised to return or notify a doctor immediately if symptoms worsen or persist or new concerns arise.  There are no Patient Instructions on file for this visit.   Colin Benton R.

## 2015-02-22 ENCOUNTER — Telehealth: Payer: Self-pay | Admitting: Internal Medicine

## 2015-02-22 MED ORDER — OLMESARTAN MEDOXOMIL-HCTZ 40-25 MG PO TABS: 1.0000 | ORAL_TABLET | Freq: Every day | ORAL | Status: DC

## 2015-02-22 NOTE — Telephone Encounter (Signed)
Patient Name: Miguel Powell DOB: 05-14-1960 Initial Comment Caller states, is not able to take beta blockers nor calcium channel blocker. His neurologist told his there was side effects. He was prescribed Metoprolol, he will need something else. He quit taking the Rx 2 days ago. Nurse Assessment Nurse: Ronnald Ramp, RN, Miranda Date/Time (Eastern Time): 02/22/2015 2:02:44 PM Confirm and document reason for call. If symptomatic, describe symptoms. ---Caller states he has been prescribed Metoprolol 7-10 days ago and had been having fatigue. Yesterday morning he started having SOB. He was diagnosed with Myasthenia Gravis and was told that if he had any reoccurrence of symptoms due to medication, to stop taking it. Has the patient traveled out of the country within the last 30 days? ---Not Applicable Does the patient have any new or worsening symptoms? ---Yes Will a triage be completed? ---Yes Related visit to physician within the last 2 weeks? ---No Does the PT have any chronic conditions? (i.e. diabetes, asthma, etc.) ---Yes List chronic conditions. ---HTN, Myasthenia Gravis Is this a behavioral health or substance abuse call? ---No Guidelines Guideline Title Affirmed Question Affirmed Notes Cough - Acute Non-Productive Cough (all triage questions negative) Final Disposition User [place], RN, Miranda Comments Yesterday BP was 130/92 CALLER STATES HE STOPPED TAKING THE METOPROLOL 2 DAYS AGO. HE WOULD LIKE A DIFFERENT MEDICATION FOR HIGH BLOOD PRESSURE.

## 2015-02-22 NOTE — Telephone Encounter (Signed)
Pt informed of below.  

## 2015-02-22 NOTE — Telephone Encounter (Signed)
Patient is calling to advise that his BP was 110/73

## 2015-02-22 NOTE — Telephone Encounter (Signed)
Ok to stop the benicar HCT 40/12.5  OK to stop the toprol XL (metoprolol)  Please start benicar hct 40.25  - 1 per day, done erx  ROV in 3 wks

## 2015-02-23 ENCOUNTER — Ambulatory Visit (INDEPENDENT_AMBULATORY_CARE_PROVIDER_SITE_OTHER): Payer: Managed Care, Other (non HMO) | Admitting: Internal Medicine

## 2015-02-23 ENCOUNTER — Encounter: Payer: Self-pay | Admitting: Internal Medicine

## 2015-02-23 ENCOUNTER — Ambulatory Visit (INDEPENDENT_AMBULATORY_CARE_PROVIDER_SITE_OTHER)
Admission: RE | Admit: 2015-02-23 | Discharge: 2015-02-23 | Disposition: A | Discharge status: Home / Self Care | Payer: Managed Care, Other (non HMO) | Source: Ambulatory Visit | Attending: Internal Medicine | Admitting: Internal Medicine

## 2015-02-23 VITALS — BP 122/78 | HR 100 | Temp 98.6°F | Ht 72.0 in | Wt 251.0 lb

## 2015-02-23 DIAGNOSIS — E119 Type 2 diabetes mellitus without complications: Secondary | ICD-10-CM

## 2015-02-23 DIAGNOSIS — I10 Essential (primary) hypertension: Secondary | ICD-10-CM | POA: Diagnosis not present

## 2015-02-23 DIAGNOSIS — R059 Cough, unspecified: Secondary | ICD-10-CM | POA: Insufficient documentation

## 2015-02-23 DIAGNOSIS — R062 Wheezing: Secondary | ICD-10-CM

## 2015-02-23 DIAGNOSIS — R05 Cough: Secondary | ICD-10-CM | POA: Diagnosis not present

## 2015-02-23 MED ORDER — AMOXICILLIN 500 MG PO CAPS: 500.0000 mg | ORAL_CAPSULE | Freq: Three times a day (TID) | ORAL | Status: DC

## 2015-02-23 MED ORDER — METHYLPREDNISOLONE ACETATE 80 MG/ML IJ SUSP
80.0000 mg | Freq: Once | INTRAMUSCULAR | Status: AC
  Administered 2015-02-23: 80 mg via INTRAMUSCULAR

## 2015-02-23 MED ORDER — HYDROCODONE-HOMATROPINE 5-1.5 MG/5ML PO SYRP: 5.0000 mL | ORAL_SOLUTION | Freq: Four times a day (QID) | ORAL | Status: DC | PRN

## 2015-02-23 NOTE — Assessment & Plan Note (Signed)
Etiology unclear, c/w bronchitis vs pna, for cxr, for amoxil asd,  to f/u any worsening symptoms or concerns, cough med prn

## 2015-02-23 NOTE — Assessment & Plan Note (Signed)
stable overall by history and exam, recent data reviewed with pt, and pt to continue medical treatment as before,  to f/u any worsening symptoms or concerns BP Readings from Last 3 Encounters:  02/23/15 122/78  02/18/15 132/88  02/05/15 142/98

## 2015-02-23 NOTE — Assessment & Plan Note (Signed)
stable overall by history and exam, recent data reviewed with pt, and pt to continue medical treatment as before,  to f/u any worsening symptoms or concerns Lab Results  Component Value Date   HGBA1C 6.3 02/05/2015

## 2015-02-23 NOTE — Addendum Note (Signed)
Addended by: Lyman Bishop on: 02/23/2015 11:40 AM   Modules accepted: Orders

## 2015-02-23 NOTE — Progress Notes (Signed)
Subjective:    Patient ID: Miguel Powell, male    DOB: 05-12-1960, 55 y.o.   MRN: [id number]  HPI  Here with acute onset mild to mod 2-3 days ST, HA, general weakness and malaise, with prod cough greenish sputum, but Pt denies chest pain, increased  orthopnea, PND, increased LE swelling, palpitations, dizziness or syncop, except for congestion and post nasal gtt that seems to lead to congestion in chest and wakes him up with lying flat at 3am for 3 nights in a row., with new onset wheezing mild and sob. No hx of CHF, has low grad temp as well last few days. Worried about pna with chronic steorid use, now down to 12.5 mg daily.  Pt is limited in options for BP and antibiotic due to hx of myasthenia gravis Past Medical History  Diagnosis Date  . ERECTILE DYSFUNCTION 09/04/2006  . CARPAL TUNNEL SYNDROME, BILATERAL 09/04/2006  . Myasthenia gravis without exacerbation ([place]) 05/16/2007  . CHALAZION, RIGHT 01/06/2008  . Myasthenia gravis ([place])   . ALLERGIC RHINITIS 09/04/2006  . ANXIETY 05/16/2007  . DIABETES MELLITUS, TYPE II 05/16/2007  . HYPERTENSION 09/04/2006  . RENAL INSUFFICIENCY 11/11/2009   Past Surgical History  Procedure Laterality Date  . Lumbar surgury  1987    reports that he has never smoked. He has never used smokeless tobacco. He reports that he drinks alcohol. He reports that he does not use illicit drugs. family history includes Aneurysm (age of onset: 24) in his brother; Cancer in his sister; Diabetes in his mother and sister; Hypertension in his sister. Allergies  Allergen Reactions  . Azithromycin     Due to MG  . Botox [Botulinum Toxin Type A]     Due to MG  . Gentamycin [Gentamicin]     Due to MG  . Ketek [Telithromycin]     Due to MG  . Levaquin [Levofloxacin In D5w]     Due to MG  . Neomycin     Due to MG  . Procainamide     Due to MG  . Quinine Derivatives     Due to MG   Current Outpatient Prescriptions on File Prior to Visit  Medication Sig Dispense Refill    . aluminum chloride (DRYSOL) 20 % external solution Apply topically 2 (two) times daily. 60 mL 3  . Ascorbic Acid (VITAMIN C) 500 MG tablet Take 500 mg by mouth 2 (two) times daily.      Marland Kitchen azaTHIOprine (IMURAN) 50 MG tablet Take 50 mg by mouth 3 (three) times daily.      . beta carotene (BL BETA CAROTENE) 16109 UNIT capsule Take 25,000 Units by mouth daily.      . calcium-vitamin D (OSCAL 500/200 D-3) 500-200 MG-UNIT per tablet Take 1 tablet by mouth daily.      . diazepam (VALIUM) 5 MG tablet 1/2 - 1 tab by mouth once daily as needed for nerves 30 tablet 0  . Efinaconazole 10 % SOLN Apply topically to the affected toenails daily 8 mL 3  . fexofenadine (ALLEGRA) 180 MG tablet Take 1 tablet (180 mg total) by mouth daily. 90 tablet 3  . fluticasone (FLONASE) 50 MCG/ACT nasal spray Place 2 sprays into both nostrils daily. 16 g 6  . Naftifine HCl (NAFTIN) 2 % CREA Apply between toes 4,5 of bilateral feet daily for 2 weeks 60 g 2  . olmesartan-hydrochlorothiazide (BENICAR HCT) 40-25 MG tablet Take 1 tablet by mouth daily. 90 tablet 3  . predniSONE (  DELTASONE) 20 MG tablet Take 20 mg by mouth daily.     Marland Kitchen pyridostigmine (MESTINON) 60 MG tablet Take 60 mg by mouth 3 (three) times daily.      . sildenafil (VIAGRA) 100 MG tablet TAKE ONE-HALF TO ONE TABLET BY MOUTH ONCE DAILY AS NEEDED FOR ERECTILE DYSFUNCTION 5 tablet 11  . vitamin E 200 UNIT capsule Take 200 Units by mouth daily.       No current facility-administered medications on file prior to visit.   Review of Systems  Constitutional: Negative for unusual diaphoresis or night sweats HENT: Negative for ringing in ear or discharge Eyes: Negative for double vision or worsening visual disturbance.  Respiratory: Negative for choking and stridor.   Gastrointestinal: Negative for vomiting or other signifcant bowel change Genitourinary: Negative for hematuria or change in urine volume.  Musculoskeletal: Negative for other MSK pain or swelling Skin:  Negative for color change and worsening wound.  Neurological: Negative for tremors and numbness other than noted  Psychiatric/Behavioral: Negative for decreased concentration or agitation other than above  '    Objective:   Physical Exam BP 122/78 mmHg  Pulse 100  Temp(Src) 98.6 F (37 C) (Oral)  Ht 6' (1.829 m)  Wt 251 lb (113.853 kg)  BMI 34.03 kg/m2  SpO2 98% VS noted, mild ill Constitutional: Pt appears in no significant distress HENT: Head: NCAT.  Right Ear: External ear normal.  Left Ear: External ear normal.  Bilat tm's with mild erythema.  Max sinus areas non tender.  Pharynx with mild erythema, no exudate Eyes: . Pupils are equal, round, and reactive to light. Conjunctivae and EOM are normal Neck: Normal range of motion. Neck supple.  Cardiovascular: Normal rate and regular rhythm.   Pulmonary/Chest: Effort normal and breath sounds decreased bilat without rales but with mild exp > insp wheezing.  Neurological: Pt is alert. Not confused , motor grossly intact Skin: Skin is warm. No rash, no LE edema Psychiatric: Pt behavior is normal. No agitation.      Assessment & Plan:

## 2015-02-23 NOTE — Progress Notes (Signed)
Pre visit review using our clinic review tool, if applicable. No additional management support is needed unless otherwise documented below in the visit note. 

## 2015-02-23 NOTE — Assessment & Plan Note (Signed)
Mild to mod, for depomedrol IM, cont same prednisone,  to f/u any worsening symptoms or concerns

## 2015-02-23 NOTE — Patient Instructions (Addendum)
You had the steroid shot today  Please take all new medication as prescribed - the antibiotic, and cough medicine as needed  Please continue all other medications as before, including the benicar HCT at 40/25 mg per day  Please have the pharmacy call with any other refills you may need.  Please continue your efforts at being more active, low cholesterol diet, and weight control.  Please keep your appointments with your specialists as you may have planned  Please go to the XRAY Department in the Basement (go straight as you get off the elevator) for the x-ray testing  You will be contacted by phone if any changes need to be made immediately.  Otherwise, you will receive a letter about your results with an explanation, but please check with MyChart first.  Please remember to sign up for MyChart if you have not done so, as this will be important to you in the future with finding out test results, communicating by private email, and scheduling acute appointments online when needed.

## 2015-03-01 ENCOUNTER — Telehealth: Payer: Self-pay | Admitting: Internal Medicine

## 2015-03-01 ENCOUNTER — Emergency Department (INDEPENDENT_AMBULATORY_CARE_PROVIDER_SITE_OTHER)
Admission: EM | Admit: 2015-03-01 | Discharge: 2015-03-01 | Disposition: A | Discharge status: Home / Self Care | Payer: Managed Care, Other (non HMO) | Source: Home / Self Care | Attending: Family Medicine | Admitting: Family Medicine

## 2015-03-01 ENCOUNTER — Encounter (HOSPITAL_COMMUNITY): Payer: Self-pay | Admitting: Emergency Medicine

## 2015-03-01 ENCOUNTER — Telehealth: Payer: Self-pay

## 2015-03-01 DIAGNOSIS — L03012 Cellulitis of left finger: Secondary | ICD-10-CM

## 2015-03-01 NOTE — Telephone Encounter (Signed)
[facility]  Patient Name: Miguel Powell  DOB: [birth year]    Initial Comment Caller states he's on Amoxicillan, sometimes he gets hot. Middle finger has been hurting since last week. Finger is still hurting.    Nurse Assessment  Nurse: Harlow Mares, RN, Suanne Marker Date/Time (Eastern Time): 03/01/2015 9:53:08 AM  Confirm and document reason for call. If symptomatic, describe symptoms. You must click the next button to save text entered. ---Caller states he's on Amoxicillin (02/23/15 for sinus infection), Middle finger has been hurting since last week. Finger is still hurting. Also gave him hydrocodone for pain. Reports that he gets up in the morning, he gets very warm (doesnt' think he is running a temperature). Last week his Left middle finger was sore on the side of the finger, 2 days ago it has green drainage around the nail.  Has the patient traveled out of the country within the last 30 days? ---No  Does the patient have any new or worsening symptoms? ---Yes  Will a triage be completed? ---Yes  Related visit to physician within the last 2 weeks? ---Yes  Does the PT have any chronic conditions? (i.e. diabetes, asthma, etc.) ---Yes  List chronic conditions. ---myasthenia gravis, hypertension  Is this a behavioral health or substance abuse call? ---No     Guidelines    Guideline Title Affirmed Question Affirmed Notes  Wound Infection Black (necrotic) or blisters develop in wound    Final Disposition User   Go to ED Now (or PCP triage) Harlow Mares, RN, Suanne Marker    Comments  Caller reports that he doesnt' want to go to ED, he would just like to see his MD. Advised that nurse will place a note on his file asking office nurse to call him back. Called office and gave verbal report of ED outcome and patient refusal.   Referrals  GO TO FACILITY REFUSED   Disagree/Comply: Disagree  Disagree/Comply Reason: Disagree with instructions

## 2015-03-01 NOTE — Telephone Encounter (Signed)
Pt was seen recently as documented in EMR  Finger laceration noted, did not seem infected, but did have quite a bit of nontender scab and swelling at the site which appeared to be more deep than just superficial laceratoin, finger and hand were o/w neurovasc intact  OK to cont the amoxil, but to go to ER for any worsening red, swelling, tenderness or drainage or fever as this would most likely require urgent hand surgury eval and/or hospn for finger cellulitis for IV antibx

## 2015-03-01 NOTE — Discharge Instructions (Signed)
Paronychia  °Paronychia is an infection of the skin. It happens near a fingernail or toenail. It may cause pain and swelling around the nail. Usually, it is not serious and it clears up with treatment. °HOME CARE °· Soak the fingers or toes in warm water as told by your doctor. You may be told to do this for 20 minutes, 2-3 times a day. °· Keep the area dry when you are not soaking it. °· Take medicines only as told by your doctor. °· If you were given an antibiotic medicine, finish all of it even if you start to feel better. °· Keep the affected area clean. °· Do not try to drain a fluid-filled bump yourself. °· Wear rubber gloves when putting your hands in water. °· Wear gloves if your hands might touch cleaners or chemicals. °· Follow your doctor's instructions about: °¨ Wound care. °¨ Bandage (dressing) changes and removal. °GET HELP IF: °· Your symptoms get worse or do not improve. °· You have a fever or chills. °· You have redness spreading from the affected area. °· You have more fluid, blood, or pus coming from the affected area. °· Your finger or knuckle is swollen or is hard to move. °  °This information is not intended to replace advice given to you by your health care provider. Make sure you discuss any questions you have with your health care provider. °  °Document Released: 01/18/2009 Document Revised: 06/16/2014 Document Reviewed: 01/07/2014 °Elsevier Interactive Patient Education ©2016 [employer] ° °

## 2015-03-01 NOTE — Telephone Encounter (Signed)
Patient called in, was wanting to come to [facility] or [facility] to have someone look at his finger, says he has a puss pocket (greenish color), and finger is very sore---he did not cut it---i do not have any openings at [facility] or [facility], i advised patient there is opening at [place] office, patient did not want to to go that far, i advised patient to go to urgent care at --patient stated he would go there

## 2015-03-01 NOTE — ED Notes (Signed)
Here with left hand index finger acute paronychia that appeared 1/11 Slight tenderness with touch, small green area noted Pt is taking Amoxicillin for viral infection

## 2015-03-02 NOTE — ED Provider Notes (Signed)
CSN: [id number]     Arrival date & time 03/01/15  1305 History   First MD Initiated Contact with Patient 03/01/15 1356     Chief Complaint  Patient presents with  . Nail Problem   (Consider location/radiation/quality/duration/timing/severity/associated sxs/prior Treatment) HPI Infected paronychia left ring finger noted since 1/11 Taking amoxil for unrelated issue Painful, soaking at home without relief of symptoms Hx of MG Past Medical History  Diagnosis Date  . ERECTILE DYSFUNCTION 09/04/2006  . CARPAL TUNNEL SYNDROME, BILATERAL 09/04/2006  . Myasthenia gravis without exacerbation ([place]) 05/16/2007  . CHALAZION, RIGHT 01/06/2008  . Myasthenia gravis ([place])   . ALLERGIC RHINITIS 09/04/2006  . ANXIETY 05/16/2007  . DIABETES MELLITUS, TYPE II 05/16/2007  . HYPERTENSION 09/04/2006  . RENAL INSUFFICIENCY 11/11/2009   Past Surgical History  Procedure Laterality Date  . Lumbar surgury  1987   Family History  Problem Relation Age of Onset  . Diabetes Mother   . Diabetes Sister   . Cancer Sister     pancreatic  . Hypertension Sister   . Aneurysm Brother 59    ruptured aneurysm   Social History  Substance Use Topics  . Smoking status: Never Smoker   . Smokeless tobacco: Never Used  . Alcohol Use: Yes     Comment: social    Review of Systems ROS +'ve paronychia left ring finger  Denies: HEADACHE, NAUSEA, ABDOMINAL PAIN, CHEST PAIN, CONGESTION, DYSURIA, SHORTNESS OF BREATH  Allergies  Azithromycin; Botox; Gentamycin; Ketek; Levaquin; Neomycin; Procainamide; and Quinine derivatives  Home Medications   Prior to Admission medications   Medication Sig Start Date End Date Taking? Authorizing Provider  aluminum chloride (DRYSOL) 20 % external solution Apply topically 2 (two) times daily. 08/01/13   Bronson Ing, DPM  amoxicillin (AMOXIL) 500 MG capsule Take 1 capsule (500 mg total) by mouth 3 (three) times daily. 02/23/15   Biagio Borg, MD  Ascorbic Acid (VITAMIN C) 500 MG tablet  Take 500 mg by mouth 2 (two) times daily.      Historical Provider, MD  azaTHIOprine (IMURAN) 50 MG tablet Take 50 mg by mouth 3 (three) times daily.      Historical Provider, MD  beta carotene (BL BETA CAROTENE) 16109 UNIT capsule Take 25,000 Units by mouth daily.      Historical Provider, MD  calcium-vitamin D (OSCAL 500/200 D-3) 500-200 MG-UNIT per tablet Take 1 tablet by mouth daily.      Historical Provider, MD  diazepam (VALIUM) 5 MG tablet 1/2 - 1 tab by mouth once daily as needed for nerves 01/08/15   Biagio Borg, MD  Efinaconazole 10 % SOLN Apply topically to the affected toenails daily 08/01/13   Bronson Ing, DPM  fexofenadine (ALLEGRA) 180 MG tablet Take 1 tablet (180 mg total) by mouth daily. 10/30/11   Biagio Borg, MD  fluticasone (FLONASE) 50 MCG/ACT nasal spray Place 2 sprays into both nostrils daily. 02/11/13   Biagio Borg, MD  HYDROcodone-homatropine [facility]) 5-1.5 MG/5ML syrup Take 5 mLs by mouth every 6 (six) hours as needed for cough. 02/23/15   Biagio Borg, MD  Naftifine HCl (NAFTIN) 2 % CREA Apply between toes 4,5 of bilateral feet daily for 2 weeks 08/01/13   Bronson Ing, DPM  olmesartan-hydrochlorothiazide (BENICAR HCT) 40-25 MG tablet Take 1 tablet by mouth daily. 02/22/15   Biagio Borg, MD  predniSONE (DELTASONE) 20 MG tablet Take 20 mg by mouth daily.     Historical Provider, MD  pyridostigmine (MESTINON) 60 MG tablet Take 60 mg by mouth 3 (three) times daily.      Historical Provider, MD  sildenafil (VIAGRA) 100 MG tablet TAKE ONE-HALF TO ONE TABLET BY MOUTH ONCE DAILY AS NEEDED FOR ERECTILE DYSFUNCTION 09/18/13   Biagio Borg, MD  vitamin E 200 UNIT capsule Take 200 Units by mouth daily.      Historical Provider, MD   Meds Ordered and Administered this Visit  Medications - No data to display  BP 123/82 mmHg  Pulse 88  Temp(Src) 98.3 F (36.8 C) (Oral)  Resp 16  SpO2 99% No data found.   Physical Exam  Constitutional: He appears well-developed and  well-nourished.  Pulmonary/Chest: Effort normal.  Musculoskeletal: He exhibits tenderness.       Hands: Nursing note and vitals reviewed.   ED Course  .Marland KitchenIncision and Drainage Date/Time: 03/02/2015 8:22 AM Performed by: Konrad Felix Authorized by: Ihor Gully D Consent: Verbal consent obtained. Consent given by: patient Patient identity confirmed: verbally with patient and arm band Time out: Immediately prior to procedure a "time out" was called to verify the correct patient, procedure, equipment, support staff and site/side marked as required. Type: abscess Body area: upper extremity Location details: left ring finger Local anesthetic: co-phenylcaine spray Scalpel size: 11 Incision type: single straight Complexity: simple Drainage: purulent Drainage amount: scant Wound treatment: wound left open Patient tolerance: Patient tolerated the procedure well with no immediate complications   (including critical care time)  Labs Review Labs Reviewed - No data to display  Imaging Review No results found.   Visual Acuity Review  Right Eye Distance:   Left Eye Distance:   Bilateral Distance:    Right Eye Near:   Left Eye Near:    Bilateral Near:         MDM   1. Paronychia of finger of left hand    Finish amoxil, Continue symptomatic treatment at home Follow up as needed.     Konrad Felix, [place] 03/02/15 [phone]

## 2015-03-15 ENCOUNTER — Telehealth: Payer: Self-pay | Admitting: *Deleted

## 2015-03-15 NOTE — Telephone Encounter (Signed)
Mr. Miguel Powell states he has fungal toenails and would like to discuss.  Pt called states scheduled with Dr. Paulla Dolly and would like to ask a question.  I left message asking pt to call again and I would answer the question.

## 2015-03-23 ENCOUNTER — Ambulatory Visit: Payer: BLUE CROSS/BLUE SHIELD | Admitting: Podiatry

## 2015-03-29 ENCOUNTER — Encounter: Payer: Self-pay | Admitting: Podiatry

## 2015-03-29 ENCOUNTER — Ambulatory Visit (INDEPENDENT_AMBULATORY_CARE_PROVIDER_SITE_OTHER): Payer: Managed Care, Other (non HMO) | Admitting: Podiatry

## 2015-03-29 VITALS — BP 139/92 | HR 67 | Resp 12

## 2015-03-29 DIAGNOSIS — B351 Tinea unguium: Secondary | ICD-10-CM | POA: Diagnosis not present

## 2015-03-30 NOTE — Progress Notes (Signed)
Subjective:     Patient ID: Miguel Powell, male   DOB: 24-Mar-1960, 55 y.o.   MRN: [id number]  HPI patient continues to complain of dystrophic changes in the third nails of both feet and knows that ultimately he needs to have them removed. 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 I explained what would be required and all complications associated with this along with risk and patient wants procedure but needs to rescheduled for next week. Today I debrided try to give him relief and reappoint for permanent procedure

## 2015-04-08 ENCOUNTER — Telehealth: Payer: Self-pay | Admitting: Internal Medicine

## 2015-04-08 NOTE — Telephone Encounter (Signed)
Dr. Jenny Reichmann put pt on a new bp medication and he checked it today and it was 103/61. He is wondering what is too low. He states he is feeling fine. Can you please call him at [phone]

## 2015-04-08 NOTE — Telephone Encounter (Signed)
Pt calling back regarding the encounter below. Please call him at [phone]

## 2015-05-06 ENCOUNTER — Other Ambulatory Visit (INDEPENDENT_AMBULATORY_CARE_PROVIDER_SITE_OTHER): Payer: Managed Care, Other (non HMO)

## 2015-05-06 ENCOUNTER — Encounter: Payer: Self-pay | Admitting: Internal Medicine

## 2015-05-06 ENCOUNTER — Ambulatory Visit (INDEPENDENT_AMBULATORY_CARE_PROVIDER_SITE_OTHER): Payer: Managed Care, Other (non HMO) | Admitting: Internal Medicine

## 2015-05-06 VITALS — BP 140/82 | HR 70 | Temp 98.8°F | Resp 20 | Wt 247.0 lb

## 2015-05-06 DIAGNOSIS — E0822 Diabetes mellitus due to underlying condition with diabetic chronic kidney disease: Secondary | ICD-10-CM

## 2015-05-06 DIAGNOSIS — Z794 Long term (current) use of insulin: Secondary | ICD-10-CM

## 2015-05-06 DIAGNOSIS — J302 Other seasonal allergic rhinitis: Secondary | ICD-10-CM

## 2015-05-06 DIAGNOSIS — N259 Disorder resulting from impaired renal tubular function, unspecified: Secondary | ICD-10-CM | POA: Diagnosis not present

## 2015-05-06 DIAGNOSIS — F411 Generalized anxiety disorder: Secondary | ICD-10-CM | POA: Diagnosis not present

## 2015-05-06 DIAGNOSIS — Z0000 Encounter for general adult medical examination without abnormal findings: Secondary | ICD-10-CM

## 2015-05-06 DIAGNOSIS — I10 Essential (primary) hypertension: Secondary | ICD-10-CM | POA: Diagnosis not present

## 2015-05-06 LAB — CBC WITH DIFFERENTIAL/PLATELET
Basophils Absolute: 0 10*3/uL (ref 0.0–0.1)
Basophils Relative: 0.4 % (ref 0.0–3.0)
EOS PCT: 1.3 % (ref 0.0–5.0)
Eosinophils Absolute: 0.1 10*3/uL (ref 0.0–0.7)
HEMATOCRIT: 42.8 % (ref 39.0–52.0)
Hemoglobin: 14.5 g/dL (ref 13.0–17.0)
LYMPHS ABS: 1.2 10*3/uL (ref 0.7–4.0)
Lymphocytes Relative: 26.1 % (ref 12.0–46.0)
MCHC: 33.9 g/dL (ref 30.0–36.0)
MCV: 91.2 fl (ref 78.0–100.0)
MONOS PCT: 6.7 % (ref 3.0–12.0)
Monocytes Absolute: 0.3 10*3/uL (ref 0.1–1.0)
NEUTROS PCT: 65.5 % (ref 43.0–77.0)
Neutro Abs: 3 10*3/uL (ref 1.4–7.7)
PLATELETS: 267 10*3/uL (ref 150.0–400.0)
RBC: 4.69 Mil/uL (ref 4.22–5.81)
RDW: 13.8 % (ref 11.5–15.5)
WBC: 4.7 10*3/uL (ref 4.0–10.5)

## 2015-05-06 LAB — URINALYSIS, ROUTINE W REFLEX MICROSCOPIC
BILIRUBIN URINE: NEGATIVE
HGB URINE DIPSTICK: NEGATIVE
Ketones, ur: NEGATIVE
LEUKOCYTES UA: NEGATIVE
Nitrite: NEGATIVE
RBC / HPF: NONE SEEN (ref 0–?)
Specific Gravity, Urine: <=1.005 — AB (ref 1.000–1.030)
TOTAL PROTEIN, URINE-UPE24: NEGATIVE
Urine Glucose: NEGATIVE
Urobilinogen, UA: 0.2 (ref 0.0–1.0)
WBC UA: NONE SEEN (ref 0–?)
pH: 6.5 (ref 5.0–8.0)

## 2015-05-06 LAB — MICROALBUMIN / CREATININE URINE RATIO
Creatinine,U: 33.3 mg/dL
MICROALB/CREAT RATIO: 2.1 mg/g (ref 0.0–30.0)
Microalb, Ur: <0.7 mg/dL (ref 0.0–1.9)

## 2015-05-06 LAB — HEPATIC FUNCTION PANEL
ALK PHOS: 39 U/L (ref 39–117)
ALT: 16 U/L (ref 0–53)
AST: 18 U/L (ref 0–37)
Albumin: 4.3 g/dL (ref 3.5–5.2)
BILIRUBIN DIRECT: 0.1 mg/dL (ref 0.0–0.3)
Total Bilirubin: 0.4 mg/dL (ref 0.2–1.2)
Total Protein: 7 g/dL (ref 6.0–8.3)

## 2015-05-06 LAB — LIPID PANEL
CHOL/HDL RATIO: 3
Cholesterol: 164 mg/dL (ref 0–200)
HDL: 60.3 mg/dL (ref >39.00)
LDL CALC: 80 mg/dL (ref 0–99)
NONHDL: 104.06
Triglycerides: 118 mg/dL (ref 0.0–149.0)
VLDL: 23.6 mg/dL (ref 0.0–40.0)

## 2015-05-06 LAB — BASIC METABOLIC PANEL
BUN: 12 mg/dL (ref 6–23)
CALCIUM: 9.6 mg/dL (ref 8.4–10.5)
CO2: 33 mEq/L — ABNORMAL HIGH (ref 19–32)
Chloride: 101 mEq/L (ref 96–112)
Creatinine, Ser: 1.28 mg/dL (ref 0.40–1.50)
GFR: 74.62 mL/min (ref >60.00)
Glucose, Bld: 142 mg/dL — ABNORMAL HIGH (ref 70–99)
Potassium: 3.9 mEq/L (ref 3.5–5.1)
SODIUM: 141 meq/L (ref 135–145)

## 2015-05-06 LAB — HEMOGLOBIN A1C: HEMOGLOBIN A1C: 6.4 % (ref 4.6–6.5)

## 2015-05-06 MED ORDER — FLUTICASONE PROPIONATE 50 MCG/ACT NA SUSP: 2.0000 | Freq: Every day | NASAL | Status: DC

## 2015-05-06 NOTE — Assessment & Plan Note (Signed)

## 2015-05-06 NOTE — Assessment & Plan Note (Signed)
Lab Results  Component Value Date   CREATININE 1.38 02/05/2015   stable overall by history and exam, recent data reviewed with pt, and pt to continue medical treatment as before,  to f/u any worsening symptoms or concerns

## 2015-05-06 NOTE — Patient Instructions (Signed)
OK to take the Probiotic called Align (OTC)  Please continue all other medications as before, and refills have been done if requested.  Please have the pharmacy call with any other refills you may need.  Please continue your efforts at being more active, low cholesterol diet, and weight control.  You are otherwise up to date with prevention measures today.  Please keep your appointments with your specialists as you may have planned  Please go to the LAB in the Basement (turn left off the elevator) for the tests to be done today  You will be contacted by phone if any changes need to be made immediately.  Otherwise, you will receive a letter about your results with an explanation, but please check with MyChart first.  Please remember to sign up for MyChart if you have not done so, as this will be important to you in the future with finding out test results, communicating by private email, and scheduling acute appointments online when needed.  Please return in 6 months, or sooner if needed, with Lab testing done 3-5 days before

## 2015-05-06 NOTE — Assessment & Plan Note (Signed)
stable overall by history and exam, recent data reviewed with pt, and pt to continue medical treatment as before,  to f/u any worsening symptoms or concerns Lab Results  Component Value Date   HGBA1C 6.3 02/05/2015   For fu lab, cont same tx

## 2015-05-06 NOTE — Progress Notes (Signed)
Subjective:    Patient ID: Miguel Powell, male    DOB: [birth year], 55 y.o.   MRN: [id number]  HPI  Here for wellness and f/u;  Overall doing ok;  Pt denies Chest pain, worsening SOB, DOE, wheezing, orthopnea, PND, worsening LE edema, palpitations, dizziness or syncope.  Pt denies neurological change such as new headache, facial or extremity weakness.  Pt denies polydipsia, polyuria, or low sugar symptoms. Pt states overall good compliance with treatment and medications, good tolerability, and has been trying to follow appropriate diet.  Pt denies worsening depressive symptoms, suicidal ideation or panic. No fever, night sweats, wt loss, loss of appetite, or other constitutional symptoms.  Pt states good ability with ADL's, has low fall risk, home safety reviewed and adequate, no other significant changes in hearing or vision, and only occasionally active with exercise.  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  Also Does have several wks ongoing nasal allergy symptoms with clearish congestion, itch and sneezing, without fever, pain, ST, cough, swelling or wheezing, ran out of flonase Denies worsening reflux, abd pain, dysphagia, n/v, bowel change or blood, though did have bleching quite a bit recently, better with probiotic per GI, Dr Tammi Klippel.  F/u colonoscopy changed from 5 to 10 yrs per GI.   Past Medical History  Diagnosis Date  . ERECTILE DYSFUNCTION 09/04/2006  . CARPAL TUNNEL SYNDROME, BILATERAL 09/04/2006  . Myasthenia gravis without exacerbation ([place]) 05/16/2007  . CHALAZION, RIGHT 01/06/2008  . Myasthenia gravis ([place])   . ALLERGIC RHINITIS 09/04/2006  . ANXIETY 05/16/2007  . DIABETES MELLITUS, TYPE II 05/16/2007  . HYPERTENSION 09/04/2006  . RENAL INSUFFICIENCY 11/11/2009   Past Surgical History  Procedure Laterality Date  . Lumbar surgury  1987    reports that he has never smoked. He has never used smokeless tobacco. He reports that he drinks alcohol. He reports that he does not use  illicit drugs. family history includes Aneurysm (age of onset: 13) in his brother; Cancer in his sister; Diabetes in his mother and sister; Hypertension in his sister. Allergies  Allergen Reactions  . Azithromycin     Due to MG  . Botox [Botulinum Toxin Type A]     Due to MG  . Gentamycin [Gentamicin]     Due to MG  . Ketek [Telithromycin]     Due to MG  . Levaquin [Levofloxacin In D5w]     Due to MG  . Neomycin     Due to MG  . Procainamide     Due to MG  . Quinine Derivatives     Due to MG   Current Outpatient Prescriptions on File Prior to Visit  Medication Sig Dispense Refill  . aluminum chloride (DRYSOL) 20 % external solution Apply topically 2 (two) times daily. 60 mL 3  . Ascorbic Acid (VITAMIN C) 500 MG tablet Take 500 mg by mouth 2 (two) times daily.      Marland Kitchen azaTHIOprine (IMURAN) 50 MG tablet Take 50 mg by mouth 3 (three) times daily.      . beta carotene (BL BETA CAROTENE) 91478 UNIT capsule Take 25,000 Units by mouth daily.      . calcium-vitamin D (OSCAL 500/200 D-3) 500-200 MG-UNIT per tablet Take 1 tablet by mouth daily.      . diazepam (VALIUM) 5 MG tablet 1/2 - 1 tab by mouth once daily as needed for nerves 30 tablet 0  . Efinaconazole 10 % SOLN Apply topically to the affected toenails daily 8  mL 3  . fexofenadine (ALLEGRA) 180 MG tablet Take 1 tablet (180 mg total) by mouth daily. 90 tablet 3  . Naftifine HCl (NAFTIN) 2 % CREA Apply between toes 4,5 of bilateral feet daily for 2 weeks 60 g 2  . olmesartan-hydrochlorothiazide (BENICAR HCT) 40-25 MG tablet Take 1 tablet by mouth daily. 90 tablet 3  . pyridostigmine (MESTINON) 60 MG tablet Take 60 mg by mouth 3 (three) times daily.      . sildenafil (VIAGRA) 100 MG tablet TAKE ONE-HALF TO ONE TABLET BY MOUTH ONCE DAILY AS NEEDED FOR ERECTILE DYSFUNCTION 5 tablet 11  . vitamin E 200 UNIT capsule Take 200 Units by mouth daily.       No current facility-administered medications on file prior to visit.   Review of  Systems Constitutional: Negative for increased diaphoresis, other activity, appetite or siginficant weight change other than noted HENT: Negative for worsening hearing loss, ear pain, facial swelling, mouth sores and neck stiffness.   Eyes: Negative for other worsening pain, redness or visual disturbance.  Respiratory: Negative for shortness of breath and wheezing  Cardiovascular: Negative for chest pain and palpitations.  Gastrointestinal: Negative for diarrhea, blood in stool, abdominal distention or other pain Genitourinary: Negative for hematuria, flank pain or change in urine volume.  Musculoskeletal: Negative for myalgias or other joint complaints.  Skin: Negative for color change and wound or drainage.  Neurological: Negative for syncope and numbness. other than noted Hematological: Negative for adenopathy. or other swelling Psychiatric/Behavioral: Negative for hallucinations, SI, self-injury, decreased concentration or other worsening agitation.      Objective:   Physical Exam BP 140/82 mmHg  Pulse 70  Temp(Src) 98.8 F (37.1 C) (Oral)  Resp 20  Wt 247 lb (112.038 kg)  SpO2 97% VS noted,  Constitutional: Pt is oriented to person, place, and time. Appears well-developed and well-nourished, in no significant distress Head: Normocephalic and atraumatic.  Right Ear: External ear normal.  Left Ear: External ear normal.  Nose: Nose normal.  Mouth/Throat: Oropharynx is clear and moist.  Bilat tm's with mild erythema.  Max sinus areas non tender.  Pharynx with mild erythema, no exudate Eyes: Conjunctivae and EOM are normal. Pupils are equal, round, and reactive to light.  Neck: Normal range of motion. Neck supple. No JVD present. No tracheal deviation present or significant neck LA or mass Cardiovascular: Normal rate, regular rhythm, normal heart sounds and intact distal pulses.   Pulmonary/Chest: Effort normal and breath sounds without rales or wheezing  Abdominal: Soft. Bowel  sounds are normal. NT. No HSM  Musculoskeletal: Normal range of motion. Exhibits no edema.  Lymphadenopathy:  Has no cervical adenopathy.  Neurological: Pt is alert and oriented to person, place, and time. Pt has normal reflexes. No cranial nerve deficit. Motor grossly intact Skin: Skin is warm and dry. No rash noted.  Psychiatric:  Has mild nervous mood and affect. Behavior is normal.     Assessment & Plan:

## 2015-05-06 NOTE — Assessment & Plan Note (Signed)
stable overall by history and exam, recent data reviewed with pt, and pt to continue medical treatment as before,  to f/u any worsening symptoms or concerns BP Readings from Last 3 Encounters:  05/06/15 140/82  03/29/15 139/92  03/01/15 123/82

## 2015-05-06 NOTE — Assessment & Plan Note (Signed)
Mild to mod, with seasaonal flare worsening, for re-start flonase,  to f/u any worsening symptoms or concerns

## 2015-05-06 NOTE — Assessment & Plan Note (Signed)
stable overall by history and exam, recent data reviewed with pt, and pt to continue medical treatment as before,  to f/u any worsening symptoms or concerns Lab Results  Component Value Date   WBC 4.0 02/05/2015   HGB 14.8 02/05/2015   HCT 43.4 02/05/2015   PLT 269.0 02/05/2015   GLUCOSE 164* 02/05/2015   CHOL 178 12/17/2014   TRIG 83.0 12/17/2014   HDL 60.50 12/17/2014   LDLCALC 101* 12/17/2014   ALT 20 02/05/2015   AST 24 02/05/2015   NA 140 02/05/2015   K 3.9 02/05/2015   CL 100 02/05/2015   CREATININE 1.38 02/05/2015   BUN 13 02/05/2015   CO2 32 02/05/2015   TSH 0.91 06/11/2014   PSA 1.63 06/11/2014   HGBA1C 6.3 02/05/2015   MICROALBUR <0.7 06/11/2014

## 2015-05-06 NOTE — Progress Notes (Signed)
Pre visit review using our clinic review tool, if applicable. No additional management support is needed unless otherwise documented below in the visit note. 

## 2015-05-07 ENCOUNTER — Encounter: Payer: Self-pay | Admitting: Internal Medicine

## 2015-05-07 LAB — PSA: PSA: 1.44 ng/mL (ref 0.10–4.00)

## 2015-05-07 LAB — TSH: TSH: 0.69 u[IU]/mL (ref 0.35–4.50)

## 2015-09-21 DIAGNOSIS — Z79899 Other long term (current) drug therapy: Secondary | ICD-10-CM | POA: Insufficient documentation

## 2015-10-15 ENCOUNTER — Other Ambulatory Visit (INDEPENDENT_AMBULATORY_CARE_PROVIDER_SITE_OTHER): Payer: Managed Care, Other (non HMO)

## 2015-10-15 ENCOUNTER — Ambulatory Visit (INDEPENDENT_AMBULATORY_CARE_PROVIDER_SITE_OTHER): Payer: Managed Care, Other (non HMO) | Admitting: Internal Medicine

## 2015-10-15 ENCOUNTER — Encounter: Payer: Self-pay | Admitting: Internal Medicine

## 2015-10-15 VITALS — BP 140/86 | HR 75 | Temp 98.5°F | Wt 248.0 lb

## 2015-10-15 DIAGNOSIS — I10 Essential (primary) hypertension: Secondary | ICD-10-CM

## 2015-10-15 DIAGNOSIS — J302 Other seasonal allergic rhinitis: Secondary | ICD-10-CM | POA: Diagnosis not present

## 2015-10-15 DIAGNOSIS — Z794 Long term (current) use of insulin: Secondary | ICD-10-CM

## 2015-10-15 DIAGNOSIS — R05 Cough: Secondary | ICD-10-CM | POA: Diagnosis not present

## 2015-10-15 DIAGNOSIS — R059 Cough, unspecified: Secondary | ICD-10-CM

## 2015-10-15 DIAGNOSIS — E0822 Diabetes mellitus due to underlying condition with diabetic chronic kidney disease: Secondary | ICD-10-CM

## 2015-10-15 LAB — LIPID PANEL
CHOLESTEROL: 163 mg/dL (ref 0–200)
HDL: 58.2 mg/dL (ref >39.00)
LDL Cholesterol: 89 mg/dL (ref 0–99)
NONHDL: 105.28
Total CHOL/HDL Ratio: 3
Triglycerides: 79 mg/dL (ref 0.0–149.0)
VLDL: 15.8 mg/dL (ref 0.0–40.0)

## 2015-10-15 LAB — HEPATIC FUNCTION PANEL
ALK PHOS: 40 U/L (ref 39–117)
ALT: 20 U/L (ref 0–53)
AST: 20 U/L (ref 0–37)
Albumin: 4.4 g/dL (ref 3.5–5.2)
BILIRUBIN DIRECT: 0.1 mg/dL (ref 0.0–0.3)
TOTAL PROTEIN: 7.4 g/dL (ref 6.0–8.3)
Total Bilirubin: 0.5 mg/dL (ref 0.2–1.2)

## 2015-10-15 LAB — CBC WITH DIFFERENTIAL/PLATELET
Basophils Absolute: 0 10*3/uL (ref 0.0–0.1)
Basophils Relative: 0.4 % (ref 0.0–3.0)
EOS PCT: 0.3 % (ref 0.0–5.0)
Eosinophils Absolute: 0 10*3/uL (ref 0.0–0.7)
HCT: 41.3 % (ref 39.0–52.0)
Hemoglobin: 14.4 g/dL (ref 13.0–17.0)
LYMPHS ABS: 1.3 10*3/uL (ref 0.7–4.0)
Lymphocytes Relative: 24.6 % (ref 12.0–46.0)
MCHC: 34.8 g/dL (ref 30.0–36.0)
MCV: 90.3 fl (ref 78.0–100.0)
MONO ABS: 0.2 10*3/uL (ref 0.1–1.0)
MONOS PCT: 4.6 % (ref 3.0–12.0)
NEUTROS ABS: 3.6 10*3/uL (ref 1.4–7.7)
NEUTROS PCT: 70.1 % (ref 43.0–77.0)
PLATELETS: 266 10*3/uL (ref 150.0–400.0)
RBC: 4.57 Mil/uL (ref 4.22–5.81)
RDW: 13.5 % (ref 11.5–15.5)
WBC: 5.2 10*3/uL (ref 4.0–10.5)

## 2015-10-15 LAB — BASIC METABOLIC PANEL
BUN: 11 mg/dL (ref 6–23)
CALCIUM: 9.5 mg/dL (ref 8.4–10.5)
CO2: 28 meq/L (ref 19–32)
Chloride: 101 mEq/L (ref 96–112)
Creatinine, Ser: 1.31 mg/dL (ref 0.40–1.50)
GFR: 72.54 mL/min (ref >60.00)
GLUCOSE: 112 mg/dL — AB (ref 70–99)
POTASSIUM: 3.7 meq/L (ref 3.5–5.1)
SODIUM: 138 meq/L (ref 135–145)

## 2015-10-15 LAB — URINALYSIS, ROUTINE W REFLEX MICROSCOPIC
BILIRUBIN URINE: NEGATIVE
KETONES UR: NEGATIVE
Leukocytes, UA: NEGATIVE
NITRITE: NEGATIVE
PH: 6 (ref 5.0–8.0)
Total Protein, Urine: NEGATIVE
Urine Glucose: NEGATIVE
Urobilinogen, UA: 0.2 (ref 0.0–1.0)

## 2015-10-15 LAB — TSH: TSH: 0.28 u[IU]/mL — ABNORMAL LOW (ref 0.35–4.50)

## 2015-10-15 LAB — HEMOGLOBIN A1C: HEMOGLOBIN A1C: 6.1 % (ref 4.6–6.5)

## 2015-10-15 MED ORDER — PREDNISONE 20 MG PO TABS: 20.0000 mg | ORAL_TABLET | Freq: Every day | ORAL | 0 refills | Status: DC

## 2015-10-15 MED ORDER — CETIRIZINE HCL 10 MG PO TABS: 10.0000 mg | ORAL_TABLET | Freq: Every day | ORAL | 11 refills | Status: DC

## 2015-10-15 MED ORDER — TRIAMCINOLONE ACETONIDE 55 MCG/ACT NA AERO: 2.0000 | INHALATION_SPRAY | Freq: Every day | NASAL | 12 refills | Status: DC

## 2015-10-15 NOTE — Progress Notes (Signed)
Pre visit review using our clinic review tool, if applicable. No additional management support is needed unless otherwise documented below in the visit note. 

## 2015-10-15 NOTE — Patient Instructions (Addendum)
Please take all new medication as prescribed - the zyrtec, nasacort and the 3 days of prednisone 20 mg per day  Please continue all other medications as before, except you can hold the prednisone 12.5 mg for 3 days  Please have the pharmacy call with any other refills you may need.  Please keep your appointments with your specialists as you may have planned  Please go to the LAB in the Basement (turn left off the elevator) for the tests to be done today  You will be contacted by phone if any changes need to be made immediately.  Otherwise, you will receive a letter about your results with an explanation, but please check with MyChart first.  Please remember to sign up for MyChart if you have not done so, as this will be important to you in the future with finding out test results, communicating by private email, and scheduling acute appointments online when needed.  Please return in sept 26, 2017 as scheduled

## 2015-10-15 NOTE — Progress Notes (Signed)
Subjective:    Patient ID: Miguel Powell, male    DOB: 01/04/1961, 55 y.o.   MRN: [id number]  HPI .Here to f/u, Does have several wks ongoing nasal allergy symptoms with clearish congestion, itch and sneezing, and ST and non prod cough without fever, malaise, other pain, or other swelling or wheezing. Specifically asks for routine labs as will be needed soon per [facility] provider.  Pt denies chest pain, increased sob or doe, wheezing, orthopnea, PND, increased LE swelling, palpitations, dizziness or syncope.  Pt denies new neurological symptoms such as new headache, or facial or extremity weakness or numbness. Pt already taking 12.5 mg daily prednisone Past Medical History:  Diagnosis Date  . ALLERGIC RHINITIS 09/04/2006  . ANXIETY 05/16/2007  . CARPAL TUNNEL SYNDROME, BILATERAL 09/04/2006  . CHALAZION, RIGHT 01/06/2008  . DIABETES MELLITUS, TYPE II 05/16/2007  . ERECTILE DYSFUNCTION 09/04/2006  . HYPERTENSION 09/04/2006  . Myasthenia gravis ([place])   . Myasthenia gravis without exacerbation ([place]) 05/16/2007  . RENAL INSUFFICIENCY 11/11/2009   Past Surgical History:  Procedure Laterality Date  . lumbar surgury  1987    reports that he has never smoked. He has never used smokeless tobacco. He reports that he drinks alcohol. He reports that he does not use drugs. family history includes Aneurysm (age of onset: 20) in his brother; Cancer in his sister; Diabetes in his mother and sister; Hypertension in his sister. Allergies  Allergen Reactions  . Azithromycin     Due to MG  . Botox [Botulinum Toxin Type A]     Due to MG  . Gentamycin [Gentamicin]     Due to MG  . Ketek [Telithromycin]     Due to MG  . Levaquin [Levofloxacin In D5w]     Due to MG  . Neomycin     Due to MG  . Procainamide     Due to MG  . Quinine Derivatives     Due to MG   Current Outpatient Prescriptions on File Prior to Visit  Medication Sig Dispense Refill  . aluminum chloride (DRYSOL) 20 % external solution Apply  topically 2 (two) times daily. 60 mL 3  . Ascorbic Acid (VITAMIN C) 500 MG tablet Take 500 mg by mouth 2 (two) times daily.      Marland Kitchen azaTHIOprine (IMURAN) 50 MG tablet Take 50 mg by mouth 3 (three) times daily.      . beta carotene (BL BETA CAROTENE) 16109 UNIT capsule Take 25,000 Units by mouth daily.      . calcium-vitamin D (OSCAL 500/200 D-3) 500-200 MG-UNIT per tablet Take 1 tablet by mouth daily.      . diazepam (VALIUM) 5 MG tablet 1/2 - 1 tab by mouth once daily as needed for nerves 30 tablet 0  . Efinaconazole 10 % SOLN Apply topically to the affected toenails daily 8 mL 3  . fexofenadine (ALLEGRA) 180 MG tablet Take 1 tablet (180 mg total) by mouth daily. 90 tablet 3  . fluticasone (FLONASE) 50 MCG/ACT nasal spray Place 2 sprays into both nostrils daily. 16 g 6  . Naftifine HCl (NAFTIN) 2 % CREA Apply between toes 4,5 of bilateral feet daily for 2 weeks 60 g 2  . olmesartan-hydrochlorothiazide (BENICAR HCT) 40-25 MG tablet Take 1 tablet by mouth daily. 90 tablet 3  . pyridostigmine (MESTINON) 60 MG tablet Take 60 mg by mouth 3 (three) times daily.      . sildenafil (VIAGRA) 100 MG tablet TAKE ONE-HALF TO ONE TABLET  BY MOUTH ONCE DAILY AS NEEDED FOR ERECTILE DYSFUNCTION 5 tablet 11  . vitamin E 200 UNIT capsule Take 200 Units by mouth daily.       No current facility-administered medications on file prior to visit.    Review of Systems  Constitutional: Negative for unusual diaphoresis or night sweats HENT: Negative for ear swelling or discharge Eyes: Negative for worsening visual haziness  Respiratory: Negative for choking and stridor.   Gastrointestinal: Negative for distension or worsening eructation Genitourinary: Negative for retention or change in urine volume.  Musculoskeletal: Negative for other MSK pain or swelling Skin: Negative for color change and worsening wound Neurological: Negative for tremors and numbness other than noted  Psychiatric/Behavioral: Negative for  decreased concentration or agitation other than above       Objective:   Physical Exam BP 140/86   Pulse 75   Temp 98.5 F (36.9 C) (Oral)   Wt 248 lb (112.5 kg)   SpO2 94%   BMI 33.63 kg/m  VS noted, not ill appearing Constitutional: Pt appears in no apparent distress HENT: Head: NCAT.  Right Ear: External ear normal.  Left Ear: External ear normal.  Bilat tm's with mild erythema.  Max sinus areas non tender.  Pharynx with mild erythema, no exudate Eyes: . Pupils are equal, round, and reactive to light. Conjunctivae and EOM are normal Neck: Normal range of motion. Neck supple.  Cardiovascular: Normal rate and regular rhythm.   Pulmonary/Chest: Effort normal and breath sounds without rales or wheezing.  Neurological: Pt is alert. Not confused , motor grossly intact Skin: Skin is warm. No rash, no LE edema Psychiatric: Pt behavior is normal. No agitation. but 1+ nervous     Assessment & Plan:

## 2015-10-15 NOTE — Progress Notes (Unsigned)
urin

## 2015-10-16 NOTE — Assessment & Plan Note (Addendum)
stable overall by history and exam, recent data reviewed with pt, and pt to continue medical treatment as before,  to f/u any worsening symptoms or concerns Lab Results  Component Value Date   HGBA1C 6.1 10/15/2015   Pt to call for onset cbg > 200 or polys on increased prednisone; also for labs today per pt request

## 2015-10-16 NOTE — Assessment & Plan Note (Signed)
Mild flare by exam, for 3 days increased prednisone to total 20 mg, then resume prior dosing,  to f/u any worsening symptoms or concerns

## 2015-10-16 NOTE — Assessment & Plan Note (Signed)
Exam o/w benign, most likely related to post nasal gtt, I done see indication for antibx at this time, pt is very concerned about this

## 2015-10-16 NOTE — Assessment & Plan Note (Signed)
stable overall by history and exam, recent data reviewed with pt, and pt to continue medical treatment as before,  to f/u any worsening symptoms or concerns BP Readings from Last 3 Encounters:  10/15/15 140/86  05/06/15 140/82  03/29/15 (!) 139/92

## 2015-11-09 ENCOUNTER — Encounter: Payer: Self-pay | Admitting: Internal Medicine

## 2015-11-09 ENCOUNTER — Other Ambulatory Visit (INDEPENDENT_AMBULATORY_CARE_PROVIDER_SITE_OTHER): Payer: Managed Care, Other (non HMO)

## 2015-11-09 ENCOUNTER — Ambulatory Visit (INDEPENDENT_AMBULATORY_CARE_PROVIDER_SITE_OTHER): Payer: Managed Care, Other (non HMO) | Admitting: Internal Medicine

## 2015-11-09 VITALS — BP 136/72 | HR 75 | Temp 98.0°F | Resp 20 | Wt 249.0 lb

## 2015-11-09 DIAGNOSIS — Z0001 Encounter for general adult medical examination with abnormal findings: Secondary | ICD-10-CM

## 2015-11-09 DIAGNOSIS — R7989 Other specified abnormal findings of blood chemistry: Secondary | ICD-10-CM | POA: Diagnosis not present

## 2015-11-09 DIAGNOSIS — Z794 Long term (current) use of insulin: Secondary | ICD-10-CM

## 2015-11-09 DIAGNOSIS — I10 Essential (primary) hypertension: Secondary | ICD-10-CM

## 2015-11-09 DIAGNOSIS — R6889 Other general symptoms and signs: Secondary | ICD-10-CM

## 2015-11-09 DIAGNOSIS — E0822 Diabetes mellitus due to underlying condition with diabetic chronic kidney disease: Secondary | ICD-10-CM | POA: Diagnosis not present

## 2015-11-09 LAB — TSH: TSH: 0.77 u[IU]/mL (ref 0.35–4.50)

## 2015-11-09 LAB — T4, FREE: Free T4: 0.83 ng/dL (ref 0.60–1.60)

## 2015-11-09 NOTE — Progress Notes (Signed)
Subjective:    Patient ID: Miguel Powell, male    DOB: [birth year], 55 y.o.   MRN: [id number]  HPI  Here to f/u; overall doing ok,  Pt denies chest pain, increasing sob or doe, wheezing, orthopnea, PND, increased LE swelling, palpitations, dizziness or syncope.  Pt denies new neurological symptoms such as new headache, or facial or extremity weakness or numbness.  Pt denies polydipsia, polyuria, or low sugar episode.   Pt denies new neurological symptoms such as new headache, or facial or extremity weakness or numbness.   Pt states overall good compliance with meds, Cough is resolved except for rare non prod.  Denies hyper or hypo thyroid symptoms such as voice, skin or hair change.  Does have several wks ongoing nasal and eye allergy symptoms with clearish congestion, itch and sneezing, without fever, pain, ST, cough, swelling or wheezing. Past Medical History:  Diagnosis Date  . ALLERGIC RHINITIS 09/04/2006  . ANXIETY 05/16/2007  . CARPAL TUNNEL SYNDROME, BILATERAL 09/04/2006  . CHALAZION, RIGHT 01/06/2008  . DIABETES MELLITUS, TYPE II 05/16/2007  . ERECTILE DYSFUNCTION 09/04/2006  . HYPERTENSION 09/04/2006  . Myasthenia gravis ([place])   . Myasthenia gravis without exacerbation ([place]) 05/16/2007  . RENAL INSUFFICIENCY 11/11/2009   Past Surgical History:  Procedure Laterality Date  . lumbar surgury  1987    reports that he has never smoked. He has never used smokeless tobacco. He reports that he drinks alcohol. He reports that he does not use drugs. family history includes Aneurysm (age of onset: 20) in his brother; Cancer in his sister; Diabetes in his mother and sister; Hypertension in his sister. Allergies  Allergen Reactions  . Azithromycin     Due to MG  . Botox [Botulinum Toxin Type A]     Due to MG  . Gentamycin [Gentamicin]     Due to MG  . Ketek [Telithromycin]     Due to MG  . Levaquin [Levofloxacin In D5w]     Due to MG  . Neomycin     Due to MG  . Procainamide     Due to MG    . Quinine Derivatives     Due to MG   Current Outpatient Prescriptions on File Prior to Visit  Medication Sig Dispense Refill  . aluminum chloride (DRYSOL) 20 % external solution Apply topically 2 (two) times daily. 60 mL 3  . Ascorbic Acid (VITAMIN C) 500 MG tablet Take 500 mg by mouth 2 (two) times daily.      Marland Kitchen azaTHIOprine (IMURAN) 50 MG tablet Take 50 mg by mouth 3 (three) times daily.      . beta carotene (BL BETA CAROTENE) 60454 UNIT capsule Take 25,000 Units by mouth daily.      . calcium-vitamin D (OSCAL 500/200 D-3) 500-200 MG-UNIT per tablet Take 1 tablet by mouth daily.      . cetirizine (ZYRTEC) 10 MG tablet Take 1 tablet (10 mg total) by mouth daily. 30 tablet 11  . diazepam (VALIUM) 5 MG tablet 1/2 - 1 tab by mouth once daily as needed for nerves 30 tablet 0  . Efinaconazole 10 % SOLN Apply topically to the affected toenails daily 8 mL 3  . fexofenadine (ALLEGRA) 180 MG tablet Take 1 tablet (180 mg total) by mouth daily. 90 tablet 3  . fluticasone (FLONASE) 50 MCG/ACT nasal spray Place 2 sprays into both nostrils daily. 16 g 6  . Naftifine HCl (NAFTIN) 2 % CREA Apply between toes 4,5 of bilateral  feet daily for 2 weeks 60 g 2  . olmesartan-hydrochlorothiazide (BENICAR HCT) 40-25 MG tablet Take 1 tablet by mouth daily. 90 tablet 3  . predniSONE (DELTASONE) 20 MG tablet Take 1 tablet (20 mg total) by mouth daily with breakfast. 3 tablet 0  . pyridostigmine (MESTINON) 60 MG tablet Take 60 mg by mouth 3 (three) times daily.      . sildenafil (VIAGRA) 100 MG tablet TAKE ONE-HALF TO ONE TABLET BY MOUTH ONCE DAILY AS NEEDED FOR ERECTILE DYSFUNCTION 5 tablet 11  . triamcinolone (NASACORT AQ) 55 MCG/ACT AERO nasal inhaler Place 2 sprays into the nose daily. 1 Inhaler 12  . vitamin E 200 UNIT capsule Take 200 Units by mouth daily.       No current facility-administered medications on file prior to visit.    Review of Systems  Constitutional: Negative for unusual diaphoresis or night  sweats HENT: Negative for ear swelling or discharge Eyes: Negative for worsening visual haziness  Respiratory: Negative for choking and stridor.   Gastrointestinal: Negative for distension or worsening eructation Genitourinary: Negative for retention or change in urine volume.  Musculoskeletal: Negative for other MSK pain or swelling Skin: Negative for color change and worsening wound Neurological: Negative for tremors and numbness other than noted  Psychiatric/Behavioral: Negative for decreased concentration or agitation other than above       Objective:   Physical Exam BP 136/72   Pulse 75   Temp 98 F (36.7 C) (Oral)   Resp 20   Wt 249 lb (112.9 kg)   SpO2 96%   BMI 33.77 kg/m  VS noted,  Constitutional: Pt appears in no apparent distress HENT: Head: NCAT.  Right Ear: External ear normal.  Left Ear: External ear normal.  Eyes: . Pupils are equal, round, and reactive to light. Conjunctivae with bilat glassy clearish d/c, and EOM are normal Neck: Normal range of motion. Neck supple.  Cardiovascular: Normal rate and regular rhythm.   Pulmonary/Chest: Effort normal and breath sounds without rales or wheezing.  Neurological: Pt is alert. Not confused , motor grossly intact Skin: Skin is warm. No rash, no LE edema Psychiatric: Pt behavior is normal. No agitation.     Assessment & Plan:

## 2015-11-09 NOTE — Assessment & Plan Note (Signed)
stable overall by history and exam, recent data reviewed with pt, and pt to continue medical treatment as before,  to f/u any worsening symptoms or concerns Lab Results  Component Value Date   HGBA1C 6.1 10/15/2015

## 2015-11-09 NOTE — Assessment & Plan Note (Signed)
For f/u thyroid testing, if persistent low TSH will need endo referral

## 2015-11-09 NOTE — Patient Instructions (Signed)
Please continue all other medications as before, and refills have been done if requested.  Please have the pharmacy call with any other refills you may need.  Please continue your efforts at being more active, low cholesterol diet, and weight control.  Please keep your appointments with your specialists as you may have planned  Please go to the LAB in the Basement (turn left off the elevator) for the tests to be done today - just the thyroid test today  You will be contacted by phone if any changes need to be made immediately.  Otherwise, you will receive a letter about your results with an explanation, but please check with MyChart first.  Please remember to sign up for MyChart if you have not done so, as this will be important to you in the future with finding out test results, communicating by private email, and scheduling acute appointments online when needed.  Please return in 6 months, or sooner if needed, with Lab testing done 3-5 days before

## 2015-11-09 NOTE — Progress Notes (Signed)
Pre visit review using our clinic review tool, if applicable. No additional management support is needed unless otherwise documented below in the visit note. 

## 2015-11-09 NOTE — Assessment & Plan Note (Signed)
stable overall by history and exam, recent data reviewed with pt, and pt to continue medical treatment as before,  to f/u any worsening symptoms or concerns BP Readings from Last 3 Encounters:  11/09/15 136/72  10/15/15 140/86  05/06/15 140/82

## 2015-11-15 ENCOUNTER — Ambulatory Visit: Payer: Managed Care, Other (non HMO) | Admitting: Internal Medicine

## 2015-11-16 ENCOUNTER — Ambulatory Visit: Payer: Managed Care, Other (non HMO) | Admitting: Internal Medicine

## 2015-11-17 ENCOUNTER — Telehealth: Payer: Self-pay | Admitting: Internal Medicine

## 2015-11-17 NOTE — Telephone Encounter (Signed)
Patient is calling about his lab results. I informed him of the notes but he has a lot of questions about his labs. Could you please follow up with him. Thank you.

## 2015-11-17 NOTE — Telephone Encounter (Signed)
Called patient unable to reach left message to give us a call back.

## 2015-12-30 ENCOUNTER — Ambulatory Visit: Payer: Self-pay | Admitting: General Surgery

## 2016-01-14 DIAGNOSIS — A630 Anogenital (venereal) warts: Secondary | ICD-10-CM

## 2016-01-14 HISTORY — DX: Anogenital (venereal) warts: A63.0

## 2016-02-03 ENCOUNTER — Telehealth: Payer: Self-pay | Admitting: Internal Medicine

## 2016-02-03 ENCOUNTER — Encounter (HOSPITAL_BASED_OUTPATIENT_CLINIC_OR_DEPARTMENT_OTHER): Payer: Self-pay | Admitting: *Deleted

## 2016-02-03 DIAGNOSIS — J3489 Other specified disorders of nose and nasal sinuses: Secondary | ICD-10-CM

## 2016-02-03 DIAGNOSIS — R300 Dysuria: Secondary | ICD-10-CM

## 2016-02-03 HISTORY — DX: Other specified disorders of nose and nasal sinuses: J34.89

## 2016-02-03 NOTE — Pre-Procedure Instructions (Signed)
Discussed hx. of myasthenia gravis with Dr. Royce Macadamia; pt. should take Imuran and Mestinon DOS; pt. OK to come for surgery

## 2016-02-03 NOTE — Telephone Encounter (Signed)
Pt called in said that he thinks he has a uti and would like to know fi Dr Jenny Reichmann would put a order in for a urine culture?  Dr Jenny Reichmann does not have any appt today or tomorrow

## 2016-02-03 NOTE — Telephone Encounter (Signed)
Called patient unable to reach and mailbox is full. Unable to leave message

## 2016-02-03 NOTE — Telephone Encounter (Signed)
Lab ordered.

## 2016-02-04 ENCOUNTER — Other Ambulatory Visit (INDEPENDENT_AMBULATORY_CARE_PROVIDER_SITE_OTHER): Payer: BLUE CROSS/BLUE SHIELD

## 2016-02-04 DIAGNOSIS — R300 Dysuria: Secondary | ICD-10-CM

## 2016-02-04 LAB — URINALYSIS, ROUTINE W REFLEX MICROSCOPIC
Bilirubin Urine: NEGATIVE
Hgb urine dipstick: NEGATIVE
Ketones, ur: NEGATIVE
Leukocytes, UA: NEGATIVE
Nitrite: NEGATIVE
RBC / HPF: NONE SEEN (ref 0–?)
Total Protein, Urine: NEGATIVE
URINE GLUCOSE: NEGATIVE
Urobilinogen, UA: 0.2 (ref 0.0–1.0)
WBC UA: NONE SEEN (ref 0–?)
pH: 7.5 (ref 5.0–8.0)

## 2016-02-04 NOTE — Telephone Encounter (Signed)
Advised patient that all providers were full today. Offered him the Saturday clinic or to go to the ER. Please advise on if patient needs to change medication for the weekend

## 2016-02-04 NOTE — Telephone Encounter (Signed)
Patient states when you call back to leave him a vm.

## 2016-02-04 NOTE — Telephone Encounter (Signed)
Left vm to notify to keep appt on Tuesday.

## 2016-02-04 NOTE — Telephone Encounter (Signed)
Unfortunately any med change for BP would require documentation of need, would would mean an OV, in order to gauge the severity, previous med tx, compliance with previous meds, potential interactions with other concomitant meds, and any other complications or comorbidities to take into account, including renal insufficiency or heart failure  Needs OV, as this is not a "simple" question

## 2016-02-04 NOTE — Telephone Encounter (Signed)
Patient states he believes this may be from alignments at the chiropractor.

## 2016-02-04 NOTE — Telephone Encounter (Signed)
Patient states that his bp has been running high the last three days.  Would like to know if Dr. Jenny Reichmann wanted to work him in today.

## 2016-02-06 ENCOUNTER — Other Ambulatory Visit: Payer: Self-pay | Admitting: Internal Medicine

## 2016-02-06 LAB — URINE CULTURE: Organism ID, Bacteria: NO GROWTH

## 2016-02-08 ENCOUNTER — Ambulatory Visit: Payer: Managed Care, Other (non HMO) | Admitting: Internal Medicine

## 2016-02-09 ENCOUNTER — Telehealth: Payer: Self-pay | Admitting: Emergency Medicine

## 2016-02-09 NOTE — Telephone Encounter (Signed)
Called patient unable to reach left message to give us a call back.

## 2016-02-09 NOTE — Telephone Encounter (Signed)
Pts blood pressure is increased to 149/100. He is asking you to call him back about this. Please advise thanks.

## 2016-02-10 ENCOUNTER — Ambulatory Visit: Payer: Managed Care, Other (non HMO) | Admitting: Internal Medicine

## 2016-02-10 ENCOUNTER — Ambulatory Visit (HOSPITAL_BASED_OUTPATIENT_CLINIC_OR_DEPARTMENT_OTHER)
Admission: RE | Admit: 2016-02-10 | Discharge: 2016-02-10 | Disposition: A | Discharge status: Home / Self Care | Payer: BLUE CROSS/BLUE SHIELD | Source: Ambulatory Visit | Attending: General Surgery | Admitting: General Surgery

## 2016-02-10 ENCOUNTER — Encounter (HOSPITAL_BASED_OUTPATIENT_CLINIC_OR_DEPARTMENT_OTHER): Payer: Self-pay | Admitting: Anesthesiology

## 2016-02-10 ENCOUNTER — Encounter (HOSPITAL_BASED_OUTPATIENT_CLINIC_OR_DEPARTMENT_OTHER)
Admission: RE | Disposition: A | Discharge status: Home / Self Care | Payer: Self-pay | Source: Ambulatory Visit | Attending: General Surgery

## 2016-02-10 ENCOUNTER — Encounter (HOSPITAL_BASED_OUTPATIENT_CLINIC_OR_DEPARTMENT_OTHER): Payer: Self-pay | Admitting: *Deleted

## 2016-02-10 DIAGNOSIS — Z79899 Other long term (current) drug therapy: Secondary | ICD-10-CM | POA: Insufficient documentation

## 2016-02-10 DIAGNOSIS — Z7952 Long term (current) use of systemic steroids: Secondary | ICD-10-CM | POA: Diagnosis not present

## 2016-02-10 DIAGNOSIS — A630 Anogenital (venereal) warts: Secondary | ICD-10-CM | POA: Insufficient documentation

## 2016-02-10 HISTORY — DX: Essential (primary) hypertension: I10

## 2016-02-10 HISTORY — DX: Unspecified osteoarthritis, unspecified site: M19.90

## 2016-02-10 HISTORY — DX: Other specified disorders of nose and nasal sinuses: J34.89

## 2016-02-10 HISTORY — DX: Anogenital (venereal) warts: A63.0

## 2016-02-10 HISTORY — PX: MINOR FULGERATION OF ANAL CONDYLOMA: SHX6467

## 2016-02-10 HISTORY — DX: Presence of dental prosthetic device (complete) (partial): Z97.2

## 2016-02-10 LAB — POCT I-STAT, CHEM 8
BUN: 10 mg/dL (ref 6–20)
Calcium, Ion: 1.09 mmol/L — ABNORMAL LOW (ref 1.15–1.40)
Chloride: 103 mmol/L (ref 101–111)
Creatinine, Ser: 1.4 mg/dL — ABNORMAL HIGH (ref 0.61–1.24)
Glucose, Bld: 122 mg/dL — ABNORMAL HIGH (ref 65–99)
HEMATOCRIT: 41 % (ref 39.0–52.0)
HEMOGLOBIN: 13.9 g/dL (ref 13.0–17.0)
Potassium: 4 mmol/L (ref 3.5–5.1)
SODIUM: 141 mmol/L (ref 135–145)
TCO2: 28 mmol/L (ref 0–100)

## 2016-02-10 SURGERY — MINOR FULGERATION OF ANAL CONDYLOMA
Anesthesia: LOCAL | Site: Anus

## 2016-02-10 MED ORDER — LIDOCAINE HCL 2 % EX GEL: 1.0000 "application " | CUTANEOUS | 0 refills | Status: DC | PRN

## 2016-02-10 MED ORDER — FENTANYL CITRATE (PF) 100 MCG/2ML IJ SOLN: 50.0000 ug | INTRAMUSCULAR | Status: DC | PRN

## 2016-02-10 MED ORDER — CHLORHEXIDINE GLUCONATE CLOTH 2 % EX PADS: 6.0000 | MEDICATED_PAD | Freq: Once | CUTANEOUS | Status: DC

## 2016-02-10 MED ORDER — HYDROCODONE-ACETAMINOPHEN 5-325 MG PO TABS: 1.0000 | ORAL_TABLET | ORAL | 0 refills | Status: DC | PRN

## 2016-02-10 MED ORDER — SODIUM BICARBONATE 4 % IV SOLN
INTRAVENOUS | Status: DC | PRN
  Administered 2016-02-10: 5 mL via INTRAVENOUS

## 2016-02-10 MED ORDER — LACTATED RINGERS IV SOLN
INTRAVENOUS | Status: DC
  Administered 2016-02-10: 10 mL/h via INTRAVENOUS

## 2016-02-10 MED ORDER — BUPIVACAINE HCL (PF) 0.5 % IJ SOLN
INTRAMUSCULAR | Status: AC
  Filled 2016-02-10: qty 30

## 2016-02-10 MED ORDER — LIDOCAINE HCL 2 % EX GEL
CUTANEOUS | Status: DC | PRN
  Administered 2016-02-10: 1 via TOPICAL

## 2016-02-10 MED ORDER — BUPIVACAINE HCL (PF) 0.5 % IJ SOLN
INTRAMUSCULAR | Status: DC | PRN
  Administered 2016-02-10: 15 mL

## 2016-02-10 MED ORDER — LIDOCAINE HCL 2 % EX GEL
CUTANEOUS | Status: AC
  Filled 2016-02-10: qty 20

## 2016-02-10 MED ORDER — MIDAZOLAM HCL 2 MG/2ML IJ SOLN: 1.0000 mg | INTRAMUSCULAR | Status: DC | PRN

## 2016-02-10 MED ORDER — LIDOCAINE HCL (PF) 1 % IJ SOLN
INTRAMUSCULAR | Status: AC
  Filled 2016-02-10: qty 30

## 2016-02-10 MED ORDER — SCOPOLAMINE 1 MG/3DAYS TD PT72: 1.0000 | MEDICATED_PATCH | Freq: Once | TRANSDERMAL | Status: DC | PRN

## 2016-02-10 MED ORDER — SODIUM BICARBONATE 4 % IV SOLN
INTRAVENOUS | Status: AC
  Filled 2016-02-10: qty 5

## 2016-02-10 MED ORDER — LIDOCAINE HCL (PF) 1 % IJ SOLN
INTRAMUSCULAR | Status: DC | PRN
  Administered 2016-02-10: 15 mL

## 2016-02-10 SURGICAL SUPPLY — 29 items
BLADE SURG 15 STRL LF DISP TIS (BLADE) ×2 IMPLANT
BLADE SURG 15 STRL SS (BLADE) ×1
BRIEF STRETCH FOR OB PAD LRG (UNDERPADS AND DIAPERS) ×3 IMPLANT
CANISTER SUCT 1200ML W/VALVE (MISCELLANEOUS) ×3 IMPLANT
DECANTER SPIKE VIAL GLASS SM (MISCELLANEOUS) IMPLANT
DRSG PAD ABDOMINAL 8X10 ST (GAUZE/BANDAGES/DRESSINGS) ×3 IMPLANT
ELECT REM PT RETURN 9FT ADLT (ELECTROSURGICAL) ×3
ELECTRODE REM PT RTRN 9FT ADLT (ELECTROSURGICAL) ×2 IMPLANT
FILTER 7/8 IN (FILTER) IMPLANT
GLOVE BIO SURGEON STRL SZ7 (GLOVE) ×3 IMPLANT
GLOVE BIOGEL PI IND STRL 8.5 (GLOVE) ×2 IMPLANT
GLOVE BIOGEL PI INDICATOR 8.5 (GLOVE) ×1
GLOVE ECLIPSE 8.0 STRL XLNG CF (GLOVE) ×3 IMPLANT
GOWN STRL REUS W/ TWL LRG LVL3 (GOWN DISPOSABLE) ×4 IMPLANT
GOWN STRL REUS W/TWL LRG LVL3 (GOWN DISPOSABLE) ×2
NEEDLE HYPO 25X1 1.5 SAFETY (NEEDLE) ×3 IMPLANT
PACK BASIN DAY SURGERY FS (CUSTOM PROCEDURE TRAY) ×3 IMPLANT
PENCIL BUTTON HOLSTER BLD 10FT (ELECTRODE) ×3 IMPLANT
SPONGE GAUZE 4X4 12PLY STER LF (GAUZE/BANDAGES/DRESSINGS) ×3 IMPLANT
SPONGE SURGIFOAM ABS GEL 100 (HEMOSTASIS) IMPLANT
SURGILUBE 2OZ TUBE FLIPTOP (MISCELLANEOUS) ×3 IMPLANT
SYR CONTROL 10ML LL (SYRINGE) ×3 IMPLANT
TOWEL OR 17X24 6PK STRL BLUE (TOWEL DISPOSABLE) ×3 IMPLANT
TRAY DSU PREP LF (CUSTOM PROCEDURE TRAY) ×3 IMPLANT
TRAY PROCTOSCOPIC FIBER OPTIC (SET/KITS/TRAYS/PACK) IMPLANT
TUBE CONNECTING 20X1/4 (TUBING) ×3 IMPLANT
UNDERPAD 30X30 (UNDERPADS AND DIAPERS) ×3 IMPLANT
VAC PENCILS W/TUBING CLEAR (MISCELLANEOUS) ×3 IMPLANT
YANKAUER SUCT BULB TIP NO VENT (SUCTIONS) ×3 IMPLANT

## 2016-02-10 NOTE — Discharge Instructions (Signed)
After bowel movements, clean the area in the shower or tub.  Apply Xylocaine ointment as needed for comfort.  Take pain medicine as needed.  Call if you have heavy bleeding or wound problems.  Wear a pad in your underwear as the wound may drain some.  Keep your scheduled postoperative appointment.

## 2016-02-10 NOTE — Interval H&P Note (Signed)
History and Physical Interval Note:  02/10/2016 3:30 PM  Miguel Powell  has presented today for surgery, with the diagnosis of ANAL CONDYLOMA  The various methods of treatment have been discussed with the patient and family. After consideration of risks, benefits and other options for treatment, the patient has consented to  Procedure(s): EXCISION AND FULGURATION OF ANAL CONDYLOMA (N/A) as a surgical intervention .  The patient's history has been reviewed, patient examined, no change in status, stable for surgery.  I have reviewed the patient's chart and labs.  Questions were answered to the patient's satisfaction.     Marcellis Frampton Lenna Sciara

## 2016-02-10 NOTE — H&P (Signed)
History of Present Illness  The patient is a 55 year old male.  Note:He has a persistent perianal condyloma. We tried chemical ablation therapy and cryoablation therapy which did not work.   Allergies  LevoFLOXacin in D5W *FLUOROQUINOLONES*  Procainamide HCl *ANTIARRHYTHMICS*  Quinine Derivatives  Azithromycin *CHEMICALS*  Botulinum Toxin Type A ([place]) *DERMATOLOGICALS*  Gentamicin Sulfate *AMINOGLYCOSIDES*   Prior to Admission medications   Medication Sig Start Date End Date Taking? Authorizing Provider  azaTHIOprine (IMURAN) 50 MG tablet Take 200 mg by mouth daily.    Yes Historical Provider, MD  calcium-vitamin D (OSCAL-500) 500-400 MG-UNIT tablet Take 1 tablet by mouth daily. 500-600   Yes Historical Provider, MD  DHEA 10 MG TABS Take by mouth.   Yes Historical Provider, MD  diphenhydrAMINE (BENADRYL) 25 MG tablet Take 25 mg by mouth every 6 (six) hours as needed.   Yes Historical Provider, MD  Multiple Vitamin (MULTIVITAMIN) tablet Take 1 tablet by mouth daily.   Yes Historical Provider, MD  NON FORMULARY Take by mouth daily. MODUCARE 20-0.2   Yes Historical Provider, MD  NON FORMULARY Take by mouth daily. DSF FORMULA   Yes Historical Provider, MD  NON FORMULARY Take by mouth daily. TOTAL MITOCHONDRIA   Yes Historical Provider, MD  NON FORMULARY Take by mouth 2 (two) times daily. TOTAL LEAKY GUT   Yes Historical Provider, MD  olmesartan-hydrochlorothiazide (BENICAR HCT) 40-25 MG tablet TAKE ONE TABLET BY MOUTH ONCE DAILY 02/08/16  Yes Biagio Borg, MD  predniSONE (DELTASONE) 2.5 MG tablet Take 12.5 mg by mouth every other day.   Yes Historical Provider, MD  pyridostigmine (MESTINON) 60 MG tablet Take 60 mg by mouth 4 (four) times daily.    Yes Historical Provider, MD   Physical Exam  The physical exam findings are as follows: Note:Note:General-obese male in no acute distress.  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  Plan: I recommended excision and fulguration of anal condyloma. We discussed the procedure and risks. Risks include but are not limited to bleeding, infection, wound healing prongs, recurrence. He seems to understand this and would like to proceed.  Jackolyn Confer, M.D.

## 2016-02-10 NOTE — Op Note (Signed)
Operative Note  Miguel Powell male 55 y.o. 02/10/2016  PREOPERATIVE DX:  Persistent perianal condyloma  POSTOPERATIVE DX:  Same  PROCEDURE:   Excision and fulguration of persistent perianal condyloma         Surgeon: Odis Hollingshead   Assistants: none  Anesthesia: Local anesthesia- 1% buffered lidocaine= 1/2% Marcaine  Indications:   This is a 55 year old male who has persistent left sided perianal condyloma that did not respond to chemical ablation or cryoablation. He now presents for the above procedure.    Procedure Detail:  He was brought to the operating room and placed on the operating table in the left lateral Sims position. The perianal area was sterilely prepped and draped. A timeout was performed.  Local anesthetic was infiltrated around the perianal condyloma on the left superficially and deep. Using the scalpel I then excised the condyloma. I used electrocautery to cauterize and fulgurate the surrounding area. There is one other suspicious area on the left buttock that I anesthetize local anesthesia and just fulgurated with electrocautery.  Both wounds were then inspected and were hemostatic. Lidocaine jelly was applied followed by dry dressing.  The specimen was sent to pathology.  He tolerated the procedure without any apparent complications. Estimated blood loss was less than 100 cc. He was taken back to the holding area in satisfactory condition.

## 2016-02-11 ENCOUNTER — Ambulatory Visit: Payer: Managed Care, Other (non HMO) | Admitting: Internal Medicine

## 2016-02-11 ENCOUNTER — Encounter (HOSPITAL_BASED_OUTPATIENT_CLINIC_OR_DEPARTMENT_OTHER): Payer: Self-pay | Admitting: General Surgery

## 2016-02-21 ENCOUNTER — Encounter (HOSPITAL_BASED_OUTPATIENT_CLINIC_OR_DEPARTMENT_OTHER): Payer: Self-pay | Admitting: General Surgery

## 2016-03-24 ENCOUNTER — Ambulatory Visit: Payer: BLUE CROSS/BLUE SHIELD | Admitting: Nurse Practitioner

## 2016-04-13 ENCOUNTER — Telehealth: Payer: Self-pay | Admitting: Internal Medicine

## 2016-04-13 NOTE — Telephone Encounter (Signed)
Patient Name: Miguel Powell  DOB: 25-May-1960    Initial Comment Caller states that his BP 74/57 and checked again BP 92/67. He feels okay. Urg. per charge.   Nurse Assessment  Nurse: Raphael Gibney, RN, Vanita Ingles Date/Time Eilene Ghazi Time): 04/13/2016 12:21:21 PM  Confirm and document reason for call. If symptomatic, describe symptoms. ---Caller states his BP was 74/57 and his BP was at [facility] 92/67. He takes Benicar/HTZ for HTN. yesterday it was 111/73. Now he is home and it is 120/90.  Does the patient have any new or worsening symptoms? ---Yes  Will a triage be completed? ---Yes  Related visit to physician within the last 2 weeks? ---No  Does the PT have any chronic conditions? (i.e. diabetes, asthma, etc.) ---Yes  List chronic conditions. ---HTN  Is this a behavioral health or substance abuse call? ---No     Guidelines    Guideline Title Affirmed Question Affirmed Notes  Low Blood Pressure [id number] Systolic BP [id number] AND [id number] taking blood pressure medications AND [3] NOT dizzy, lightheaded or weak    Final Disposition User   See Physician within 24 Hours Stringer, RN, Vera    Comments  Pt states he already has appt scheduled for 1:45 pm tomorrow   Referrals  REFERRED TO PCP OFFICE   Disagree/Comply: Disagree  Disagree/Comply Reason: Disagree with instructions

## 2016-04-14 ENCOUNTER — Other Ambulatory Visit (INDEPENDENT_AMBULATORY_CARE_PROVIDER_SITE_OTHER): Payer: BLUE CROSS/BLUE SHIELD

## 2016-04-14 ENCOUNTER — Ambulatory Visit (INDEPENDENT_AMBULATORY_CARE_PROVIDER_SITE_OTHER)
Admission: RE | Admit: 2016-04-14 | Discharge: 2016-04-14 | Disposition: A | Discharge status: Home / Self Care | Payer: BLUE CROSS/BLUE SHIELD | Source: Ambulatory Visit | Attending: Internal Medicine | Admitting: Internal Medicine

## 2016-04-14 ENCOUNTER — Ambulatory Visit (INDEPENDENT_AMBULATORY_CARE_PROVIDER_SITE_OTHER): Payer: BLUE CROSS/BLUE SHIELD | Admitting: Internal Medicine

## 2016-04-14 ENCOUNTER — Encounter: Payer: Self-pay | Admitting: Internal Medicine

## 2016-04-14 VITALS — BP 136/82 | HR 96 | Temp 98.6°F | Ht 72.0 in | Wt 245.0 lb

## 2016-04-14 DIAGNOSIS — E0822 Diabetes mellitus due to underlying condition with diabetic chronic kidney disease: Secondary | ICD-10-CM | POA: Diagnosis not present

## 2016-04-14 DIAGNOSIS — Z0001 Encounter for general adult medical examination with abnormal findings: Secondary | ICD-10-CM

## 2016-04-14 DIAGNOSIS — R05 Cough: Secondary | ICD-10-CM

## 2016-04-14 DIAGNOSIS — F528 Other sexual dysfunction not due to a substance or known physiological condition: Secondary | ICD-10-CM | POA: Diagnosis not present

## 2016-04-14 DIAGNOSIS — Z794 Long term (current) use of insulin: Secondary | ICD-10-CM

## 2016-04-14 DIAGNOSIS — R059 Cough, unspecified: Secondary | ICD-10-CM

## 2016-04-14 DIAGNOSIS — Z0000 Encounter for general adult medical examination without abnormal findings: Secondary | ICD-10-CM | POA: Diagnosis not present

## 2016-04-14 LAB — PSA: PSA: 2.3 ng/mL (ref 0.10–4.00)

## 2016-04-14 LAB — HEMOGLOBIN A1C: Hgb A1c MFr Bld: 6.3 % (ref 4.6–6.5)

## 2016-04-14 LAB — TESTOSTERONE: TESTOSTERONE: 323.06 ng/dL (ref 300.00–890.00)

## 2016-04-14 NOTE — Patient Instructions (Signed)
Please continue all other medications as before, and refills have been done if requested.  Please have the pharmacy call with any other refills you may need.  Please continue your efforts at being more active, low cholesterol diet, and weight control.  You are otherwise up to date with prevention measures today.  Please keep your appointments with your specialists as you may have planned  Please go to the XRAY Department in the Basement (go straight as you get off the elevator) for the x-ray testing  Please go to the LAB in the Basement (turn left off the elevator) for the tests to be done today  You will be contacted by phone if any changes need to be made immediately.  Otherwise, you will receive a letter about your results with an explanation, but please check with MyChart first.  Please remember to sign up for MyChart if you have not done so, as this will be important to you in the future with finding out test results, communicating by private email, and scheduling acute appointments online when needed.  Please return in 6 months, or sooner if needed, with Lab testing done 3-5 days before  

## 2016-04-14 NOTE — Progress Notes (Signed)
Subjective:    Patient ID: Miguel Powell, male    DOB: [birth year], 56 y.o.   MRN: [id number]  HPI  Here for wellness and f/u;  Overall doing ok;  Pt denies Chest pain, worsening SOB, DOE, wheezing, orthopnea, PND, worsening LE edema, palpitations, dizziness or syncope.  Pt denies neurological change such as new headache, facial or extremity weakness.  Pt denies polydipsia, polyuria, or low sugar symptoms. Pt states overall good compliance with treatment and medications, good tolerability, and has been trying to follow appropriate diet.  Pt denies worsening depressive symptoms, suicidal ideation or panic, though has cont'd persistent anxiety. No fever, night sweats, wt loss, loss of appetite, or other constitutional symptoms.  Pt states good ability with ADL's, has low fall risk, home safety reviewed and adequate, no other significant changes in hearing or vision, and only occasionally active with exercise.   Also, BP at [facility] was 75/57 but realizes now was likely inaccurate. BP at home 124/85. Asks for testosterone level due to fatigue, and has mild non prod cough for unclear reasons in last few wks. Past Medical History:  Diagnosis Date  . 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 Arthritis    neck  . Hypertension    states BP has been elevated recently; has been on med. since 1990s  . Myasthenia gravis ([place])   . Sinus drainage 02/03/2016  . Wears partial dentures    upper    Past Surgical History:  Procedure Laterality Date  . LASER ABLATION OF CONDYLOMAS  11/04/2009  . [place]  . MINOR FULGERATION OF ANAL CONDYLOMA N/A 02/10/2016   Procedure: EXCISION AND FULGURATION OF ANAL CONDYLOMA;  Surgeon: Jackolyn Confer, MD;  Location: [facility];  Service: General;  Laterality: N/A;    reports that he has never smoked. He has never used smokeless tobacco. He reports that he drinks alcohol. He reports that he does not use drugs. family history includes Aneurysm (age of  onset: 65) in his brother; Cancer in his sister; Diabetes in his mother and sister; Hypertension in his sister. Allergies  Allergen Reactions  . Azithromycin Other (See Comments)    DUE TO MYASTHENIA GRAVIS  . Botox [Botulinum Toxin Type A] Other (See Comments)    DUE TO MYASTHENIA GRAVIS  . Gentamycin [Gentamicin] Other (See Comments)    DUE TO MYASTHENIA GRAVIS  . Ketek [Telithromycin] Other (See Comments)    DUE TO MYASTHENIA GRAVIS  . Levaquin [Levofloxacin In D5w] Other (See Comments)    DUE TO MYASTHENIA GRAVIS  . Magnesium-Containing Compounds Other (See Comments)    DUE TO MYASTHENIA GRAVIS  . Neomycin Other (See Comments)    DUE TO MYASTHENIA GRAVIS  . Penicillins Other (See Comments)    DUE TO MYASTHENIA GRAVIS   . Procainamide Other (See Comments)    DUE TO MYASTHENIA GRAVIS  . Quinine Derivatives Other (See Comments)    DUE TO MYASTHENIA GRAVIS   Current Outpatient Prescriptions on File Prior to Visit  Medication Sig Dispense Refill  . azaTHIOprine (IMURAN) 50 MG tablet Take 200 mg by mouth daily.     . calcium-vitamin D (OSCAL-500) 500-400 MG-UNIT tablet Take 1 tablet by mouth daily. 500-600    . DHEA 10 MG TABS Take by mouth.    . diphenhydrAMINE (BENADRYL) 25 MG tablet Take 25 mg by mouth every 6 (six) hours as needed.    Marland Kitchen HYDROcodone-acetaminophen (NORCO/VICODIN) 5-325 MG tablet Take 1-2 tablets by mouth every 4 (four) hours  as needed for moderate pain or severe pain. 30 tablet 0  . lidocaine (XYLOCAINE) 2 % jelly Apply 1 application topically as needed. 30 mL 0  . Multiple Vitamin (MULTIVITAMIN) tablet Take 1 tablet by mouth daily.    . NON FORMULARY Take by mouth daily. MODUCARE 20-0.2    . NON FORMULARY Take by mouth daily. DSF FORMULA    . NON FORMULARY Take by mouth daily. TOTAL MITOCHONDRIA    . NON FORMULARY Take by mouth 2 (two) times daily. TOTAL LEAKY GUT    . olmesartan-hydrochlorothiazide (BENICAR HCT) 40-25 MG tablet TAKE ONE TABLET BY MOUTH ONCE  DAILY 60 tablet 0  . predniSONE (DELTASONE) 2.5 MG tablet Take 12.5 mg by mouth every other day.    . pyridostigmine (MESTINON) 60 MG tablet Take 60 mg by mouth 4 (four) times daily.      No current facility-administered medications on file prior to visit.    Review of Systems Constitutional: Negative for increased diaphoresis, or other activity, appetite or siginficant weight change other than noted HENT: Negative for worsening hearing loss, ear pain, facial swelling, mouth sores and neck stiffness.   Eyes: Negative for other worsening pain, redness or visual disturbance.  Respiratory: Negative for choking or stridor Cardiovascular: Negative for other chest pain and palpitations.  Gastrointestinal: Negative for worsening diarrhea, blood in stool, or abdominal distention Genitourinary: Negative for hematuria, flank pain or change in urine volume.  Musculoskeletal: Negative for myalgias or other joint complaints.  Skin: Negative for other color change and wound or drainage.  Neurological: Negative for syncope and numbness. other than noted Hematological: Negative for adenopathy. or other swelling Psychiatric/Behavioral: Negative for hallucinations, SI, self-injury, decreased concentration or other worsening agitation.  All other system neg per pt    Objective:   Physical Exam BP 136/82   Pulse 96   Temp 98.6 F (37 C)   Ht 6' (1.829 m)   Wt 245 lb (111.1 kg)   SpO2 99%   BMI 33.23 kg/m  VS noted,  Constitutional: Pt is oriented to person, place, and time. Appears well-developed and well-nourished, in no significant distress Head: Normocephalic and atraumatic  Eyes: Conjunctivae and EOM are normal. Pupils are equal, round, and reactive to light Right Ear: External ear normal.  Left Ear: External ear normal Nose: Nose normal.  Mouth/Throat: Oropharynx is clear and moist  Neck: Normal range of motion. Neck supple. No JVD present. No tracheal deviation present or significant neck  LA or mass Cardiovascular: Normal rate, regular rhythm, normal heart sounds and intact distal pulses.   Pulmonary/Chest: Effort normal and breath sounds without rales or wheezing  Abdominal: Soft. Bowel sounds are normal. NT. No HSM  Musculoskeletal: Normal range of motion. Exhibits no edema Lymphadenopathy: Has no cervical adenopathy.  Neurological: Pt is alert and oriented to person, place, and time. Pt has normal reflexes. No cranial nerve deficit. Motor grossly intact, but does have drooping left upper eyelid chronic Skin: Skin is warm and dry. No rash noted or new ulcers Psychiatric:  Has normal mood and affect. Behavior is normal.  No other new exam findings  Lab Results  Component Value Date   WBC 5.2 10/15/2015   HGB 13.9 02/10/2016   HCT 41.0 02/10/2016   PLT 266.0 10/15/2015   GLUCOSE 122 (H) 02/10/2016   CHOL 163 10/15/2015   TRIG 79.0 10/15/2015   HDL 58.20 10/15/2015   LDLCALC 89 10/15/2015   ALT 20 10/15/2015   AST 20 10/15/2015  NA 141 02/10/2016   K 4.0 02/10/2016   CL 103 02/10/2016   CREATININE 1.40 (H) 02/10/2016   BUN 10 02/10/2016   CO2 28 10/15/2015   TSH 0.77 11/09/2015   PSA 1.44 05/06/2015   HGBA1C 6.1 10/15/2015   MICROALBUR <0.7 05/06/2015       Assessment & Plan:

## 2016-04-15 NOTE — Assessment & Plan Note (Signed)
Mild persistent, for testosterone level

## 2016-04-15 NOTE — Assessment & Plan Note (Signed)

## 2016-04-15 NOTE — Assessment & Plan Note (Signed)
Exam benign, ok for cxr, to f/u any worsening symptoms or concerns

## 2016-04-15 NOTE — Assessment & Plan Note (Signed)
stable overall by history and exam, recent data reviewed with pt, and pt to continue medical treatment as before,  to f/u any worsening symptoms or concerns Lab Results  Component Value Date   HGBA1C 6.3 04/14/2016

## 2016-04-17 ENCOUNTER — Other Ambulatory Visit: Payer: Self-pay | Admitting: *Deleted

## 2016-04-17 MED ORDER — OLMESARTAN MEDOXOMIL-HCTZ 40-25 MG PO TABS: 1.0000 | ORAL_TABLET | Freq: Every day | ORAL | 11 refills | Status: DC

## 2016-05-12 ENCOUNTER — Emergency Department (HOSPITAL_COMMUNITY)
Admission: EM | Admit: 2016-05-12 | Discharge: 2016-05-12 | Disposition: A | Discharge status: Home / Self Care | Payer: BLUE CROSS/BLUE SHIELD | Attending: Emergency Medicine | Admitting: Emergency Medicine

## 2016-05-12 ENCOUNTER — Emergency Department (HOSPITAL_COMMUNITY): Payer: BLUE CROSS/BLUE SHIELD

## 2016-05-12 ENCOUNTER — Encounter (HOSPITAL_COMMUNITY): Payer: Self-pay | Admitting: Emergency Medicine

## 2016-05-12 DIAGNOSIS — E119 Type 2 diabetes mellitus without complications: Secondary | ICD-10-CM | POA: Diagnosis not present

## 2016-05-12 DIAGNOSIS — R1084 Generalized abdominal pain: Secondary | ICD-10-CM

## 2016-05-12 DIAGNOSIS — R109 Unspecified abdominal pain: Secondary | ICD-10-CM | POA: Diagnosis present

## 2016-05-12 DIAGNOSIS — I10 Essential (primary) hypertension: Secondary | ICD-10-CM | POA: Insufficient documentation

## 2016-05-12 LAB — COMPREHENSIVE METABOLIC PANEL
ALK PHOS: 44 U/L (ref 38–126)
ALT: 22 U/L (ref 17–63)
ANION GAP: 8 (ref 5–15)
AST: 28 U/L (ref 15–41)
Albumin: 4.4 g/dL (ref 3.5–5.0)
BILIRUBIN TOTAL: 0.9 mg/dL (ref 0.3–1.2)
BUN: 14 mg/dL (ref 6–20)
CALCIUM: 9.6 mg/dL (ref 8.9–10.3)
CO2: 29 mmol/L (ref 22–32)
Chloride: 101 mmol/L (ref 101–111)
Creatinine, Ser: 1.37 mg/dL — ABNORMAL HIGH (ref 0.61–1.24)
GFR, EST NON AFRICAN AMERICAN: 56 mL/min — AB (ref >60)
Glucose, Bld: 170 mg/dL — ABNORMAL HIGH (ref 65–99)
Potassium: 3.6 mmol/L (ref 3.5–5.1)
Sodium: 138 mmol/L (ref 135–145)
TOTAL PROTEIN: 7.7 g/dL (ref 6.5–8.1)

## 2016-05-12 LAB — URINALYSIS, ROUTINE W REFLEX MICROSCOPIC
Bilirubin Urine: NEGATIVE
Glucose, UA: NEGATIVE mg/dL
HGB URINE DIPSTICK: NEGATIVE
Ketones, ur: NEGATIVE mg/dL
Leukocytes, UA: NEGATIVE
NITRITE: NEGATIVE
PROTEIN: NEGATIVE mg/dL
Specific Gravity, Urine: 1.002 — ABNORMAL LOW (ref 1.005–1.030)
pH: 7 (ref 5.0–8.0)

## 2016-05-12 LAB — CBC
HCT: 42 % (ref 39.0–52.0)
HEMOGLOBIN: 14.8 g/dL (ref 13.0–17.0)
MCH: 31.6 pg (ref 26.0–34.0)
MCHC: 35.2 g/dL (ref 30.0–36.0)
MCV: 89.7 fL (ref 78.0–100.0)
PLATELETS: 271 10*3/uL (ref 150–400)
RBC: 4.68 MIL/uL (ref 4.22–5.81)
RDW: 12.9 % (ref 11.5–15.5)
WBC: 3.5 10*3/uL — AB (ref 4.0–10.5)

## 2016-05-12 LAB — LIPASE, BLOOD: Lipase: 39 U/L (ref 11–51)

## 2016-05-12 MED ORDER — SIMETHICONE 80 MG PO CHEW: 80.0000 mg | CHEWABLE_TABLET | Freq: Four times a day (QID) | ORAL | 0 refills | Status: AC | PRN

## 2016-05-12 NOTE — ED Triage Notes (Signed)
Pt called to triage; no response from lobby.

## 2016-05-12 NOTE — ED Triage Notes (Signed)
Pt complaint of "warm and bloating" to abdominal since this morning; denies n/v/d.

## 2016-05-12 NOTE — ED Provider Notes (Signed)
[facility] DEPT Provider Note   CSN: [id number] Arrival date & time: 05/12/16  1500     History   Chief Complaint Chief Complaint  Patient presents with  . Abdominal Pain    HPI Miguel Powell is a 56 y.o. male.  Patient with history of myasthenia gravis on immunosuppression and prednisone chronically, history of colon polyps last colonoscopy 6 years ago -- presents with bloating and warm sensation on the left side of the abdomen starting this morning. Patient states he has had this in the past. He is followed up with GI and has been prescribed probiotics. No associated fevers, nausea, vomiting, diarrhea. Patient had 3 normal bowel movements today without blood. No urinary symptoms. Onset of symptoms acute. Course is improving. Nothing makes symptoms better or worse.      Past Medical History:  Diagnosis Date  . 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 Arthritis    neck  . Hypertension    states BP has been elevated recently; has been on med. since 1990s  . Myasthenia gravis ([place])   . Sinus drainage 02/03/2016  . Wears partial dentures    upper     Patient Active Problem List   Diagnosis Date Noted  . Abnormal TSH 11/09/2015  . Cough 02/23/2015  . Acute upper respiratory infection 02/05/2015  . Mixed hearing loss, bilateral 02/05/2015  . Encounter for therapeutic drug monitoring 12/18/2014  . LLQ pain 12/18/2014  . Lower back pain 10/28/2014  . Preventative health care 10/30/2011  . Myasthenia gravis ([place])   . 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 Diabetes ([place]) 05/16/2007  . Anxiety state 05/16/2007  . Allergic rhinitis 09/04/2006  . Essential hypertension 09/04/2006  . ERECTILE DYSFUNCTION 09/04/2006    Past Surgical History:  Procedure Laterality Date  . LASER ABLATION OF CONDYLOMAS  11/04/2009  . [place]  . MINOR FULGERATION OF ANAL CONDYLOMA N/A 02/10/2016   Procedure: EXCISION AND FULGURATION OF ANAL CONDYLOMA;   Surgeon: Jackolyn Confer, MD;  Location: [facility];  Service: General;  Laterality: N/A;       Home Medications    Prior to Admission medications   Medication Sig Start Date End Date Taking? Authorizing Provider  azaTHIOprine (IMURAN) 50 MG tablet Take 200 mg by mouth daily.     Historical Provider, MD  calcium-vitamin D (OSCAL-500) 500-400 MG-UNIT tablet Take 1 tablet by mouth daily. 500-600    Historical Provider, MD  DHEA 10 MG TABS Take by mouth.    Historical Provider, MD  diphenhydrAMINE (BENADRYL) 25 MG tablet Take 25 mg by mouth every 6 (six) hours as needed.    Historical Provider, MD  HYDROcodone-acetaminophen (NORCO/VICODIN) 5-325 MG tablet Take 1-2 tablets by mouth every 4 (four) hours as needed for moderate pain or severe pain. 02/10/16   Jackolyn Confer, MD  lidocaine (XYLOCAINE) 2 % jelly Apply 1 application topically as needed. 02/10/16   Jackolyn Confer, MD  Multiple Vitamin (MULTIVITAMIN) tablet Take 1 tablet by mouth daily.    Historical Provider, MD  NON FORMULARY Take by mouth daily. MODUCARE 20-0.2    Historical Provider, MD  NON FORMULARY Take by mouth daily. DSF FORMULA    Historical Provider, MD  NON FORMULARY Take by mouth daily. TOTAL MITOCHONDRIA    Historical Provider, MD  NON FORMULARY Take by mouth 2 (two) times daily. TOTAL LEAKY GUT    Historical Provider, MD  olmesartan-hydrochlorothiazide (BENICAR HCT) 40-25 MG tablet Take 1 tablet by mouth daily.  04/17/16   Biagio Borg, MD  predniSONE (DELTASONE) 2.5 MG tablet Take 12.5 mg by mouth every other day.    Historical Provider, MD  pyridostigmine (MESTINON) 60 MG tablet Take 60 mg by mouth 4 (four) times daily.     Historical Provider, MD    Family History Family History  Problem Relation Age of Onset  . Aneurysm Brother 33    ruptured aneurysm  . Diabetes Mother   . Diabetes Sister   . Cancer Sister     pancreatic  . Hypertension Sister     Social History Social History  Substance  Use Topics  . Smoking status: Never Smoker  . Smokeless tobacco: Never Used  . Alcohol use Yes     Comment: occasionally     Allergies   Azithromycin; Botox [botulinum toxin type a]; Gentamycin [gentamicin]; Ketek [telithromycin]; Levaquin [levofloxacin in d5w]; Magnesium-containing compounds; Neomycin; Penicillins; Procainamide; and Quinine derivatives   Review of Systems Review of Systems  Constitutional: Negative for fever.  HENT: Negative for rhinorrhea and sore throat.   Eyes: Negative for redness.  Respiratory: Negative for cough.   Cardiovascular: Negative for chest pain.  Gastrointestinal: Positive for abdominal distention. Negative for abdominal pain, blood in stool, diarrhea, nausea and vomiting.  Genitourinary: Negative for dysuria.  Musculoskeletal: Negative for myalgias.  Skin: Negative for rash.  Neurological: Negative for headaches.     Physical Exam Updated Vital Signs BP (!) 154/104 (BP Location: Left Arm)   Pulse 86   Temp 98.1 F (36.7 C) (Oral)   Resp 20   Wt 111.6 kg   SpO2 98%   BMI 33.36 kg/m   Physical Exam  Constitutional: He appears well-developed and well-nourished.  HENT:  Head: Normocephalic and atraumatic.  Eyes: Conjunctivae are normal. Right eye exhibits no discharge. Left eye exhibits no discharge.  Neck: Normal range of motion. Neck supple.  Cardiovascular: Normal rate, regular rhythm and normal heart sounds.   No murmur heard. Pulmonary/Chest: Effort normal and breath sounds normal. No respiratory distress. He has no wheezes. He has no rales.  Abdominal: Soft. He exhibits no distension and no mass. There is no tenderness. There is no guarding.  Neurological: He is alert.  Skin: Skin is warm and dry.  Psychiatric: He has a normal mood and affect.  Nursing note and vitals reviewed.    ED Treatments / Results  Labs (all labs ordered are listed, but only abnormal results are displayed) Labs Reviewed  COMPREHENSIVE METABOLIC  PANEL - Abnormal; Notable for the following:       Result Value   Glucose, Bld 170 (*)    Creatinine, Ser 1.37 (*)    GFR calc non Af Amer 56 (*)    All other components within normal limits  CBC - Abnormal; Notable for the following:    WBC 3.5 (*)    All other components within normal limits  URINALYSIS, ROUTINE W REFLEX MICROSCOPIC - Abnormal; Notable for the following:    Color, Urine STRAW (*)    Specific Gravity, Urine 1.002 (*)    All other components within normal limits  LIPASE, BLOOD    Radiology Dg Abd 2 Views  Result Date: 05/12/2016 CLINICAL DATA:  Bloating and warmth to LUQ of abdomen since this morning. Denies n/v/d. EXAM: ABDOMEN - 2 VIEW COMPARISON:  None. FINDINGS: Moderate amount of stool in the ascending colon. There is no bowel dilatation to suggest obstruction. There is no evidence of pneumoperitoneum, portal venous gas or pneumatosis. There  are no pathologic calcifications along the expected course of the ureters. The osseous structures are unremarkable. IMPRESSION: Negative. Electronically Signed   By: Kathreen Devoid   On: 05/12/2016 20:03    Procedures Procedures (including critical care time)   Initial Impression / Assessment and Plan / ED Course  I have reviewed the triage vital signs and the nursing notes.  Pertinent labs & imaging results that were available during my care of the patient were reviewed by me and considered in my medical decision making (see chart for details).     Patient seen and examined. Work-up reviewed with patient. X-ray ordered. If negative, will discharge home with symptomatic treatment.   Vital signs reviewed and are as follows: BP (!) 154/104 (BP Location: Left Arm)   Pulse 86   Temp 98.1 F (36.7 C) (Oral)   Resp 20   Wt 111.6 kg   SpO2 98%   BMI 33.36 kg/m   Patient updated on results. He will use simethicone for symptomatic treatment.  The patient was urged to return to the Emergency Department immediately with  worsening of current symptoms, worsening abdominal pain, persistent vomiting, blood noted in stools, fever, or any other concerns. The patient verbalized understanding.    Final Clinical Impressions(s) / ED Diagnoses   Final diagnoses:  Generalized abdominal pain   Patient with abdominal Symptoms described more as distention and a warm feeling in the left abdomen. No tenderness on exam.. Vitals are stable, no fever. Labs reassuring. Imaging without evidence of obstruction. Patient is not clinically obstructed. No signs of dehydration, patient is tolerating PO's. Lungs are clear and no signs suggestive of PNA. Low concern for appendicitis, cholecystitis, pancreatitis, ruptured viscus, UTI, kidney stone, aortic dissection, aortic aneurysm or other emergent abdominal etiology. Supportive therapy indicated with return if symptoms worsen.    New Prescriptions Discharge Medication List as of 05/12/2016  8:23 PM    START taking these medications   Details  simethicone (GAS-X) 80 MG chewable tablet Chew 1 tablet (80 mg total) by mouth every 6 (six) hours as needed for flatulence., Starting Fri 05/12/2016, Print         Carlisle Cater, PA-C 05/12/16 [place], MD 05/14/16 1230

## 2016-05-12 NOTE — Discharge Instructions (Signed)
Please read and follow all provided instructions.  Your diagnoses today include:  1. Generalized abdominal pain     Tests performed today include:  Blood counts and electrolytes  Blood tests to check liver and kidney function  Blood tests to check pancreas function  Urine test to look for infection  X-ray - shows no problems  Vital signs. See below for your results today.   Medications prescribed:   None  Take any prescribed medications only as directed.  Home care instructions:   Follow any educational materials contained in this packet.  Follow-up instructions: Please follow-up with your primary care provider in the next 7 days for further evaluation of your symptoms.    Return instructions:  SEEK IMMEDIATE MEDICAL ATTENTION IF:  The pain does not go away or becomes severe   A temperature above 101F develops   Repeated vomiting occurs (multiple episodes)   The pain becomes localized to portions of the abdomen. The right side could possibly be appendicitis. In an adult, the left lower portion of the abdomen could be colitis or diverticulitis.   Blood is being passed in stools or vomit (bright red or black tarry stools)   You develop chest pain, difficulty breathing, dizziness or fainting, or become confused, poorly responsive, or inconsolable (young children)  If you have any other emergent concerns regarding your health  Additional Information: Abdominal (belly) pain can be caused by many things. Your caregiver performed an examination and possibly ordered blood/urine tests and imaging (CT scan, x-rays, ultrasound). Many cases can be observed and treated at home after initial evaluation in the emergency department. Even though you are being discharged home, abdominal pain can be unpredictable. Therefore, you need a repeated exam if your pain does not resolve, returns, or worsens. Most patients with abdominal pain don't have to be admitted to the hospital or have  surgery, but serious problems like appendicitis and gallbladder attacks can start out as nonspecific pain. Many abdominal conditions cannot be diagnosed in one visit, so follow-up evaluations are very important.  Your vital signs today were: BP (!) 154/104 (BP Location: Left Arm)    Pulse 86    Temp 98.1 F (36.7 C) (Oral)    Resp 20    Wt 111.6 kg    SpO2 98%    BMI 33.36 kg/m  If your blood pressure (bp) was elevated above 135/85 this visit, please have this repeated by your doctor within one month. --------------

## 2016-06-03 ENCOUNTER — Emergency Department (HOSPITAL_COMMUNITY): Payer: BLUE CROSS/BLUE SHIELD

## 2016-06-03 ENCOUNTER — Encounter (HOSPITAL_COMMUNITY): Payer: Self-pay | Admitting: Emergency Medicine

## 2016-06-03 ENCOUNTER — Emergency Department (HOSPITAL_COMMUNITY)
Admission: EM | Admit: 2016-06-03 | Discharge: 2016-06-03 | Disposition: A | Discharge status: Home / Self Care | Payer: BLUE CROSS/BLUE SHIELD | Attending: Emergency Medicine | Admitting: Emergency Medicine

## 2016-06-03 DIAGNOSIS — R0602 Shortness of breath: Secondary | ICD-10-CM | POA: Diagnosis present

## 2016-06-03 DIAGNOSIS — Z79899 Other long term (current) drug therapy: Secondary | ICD-10-CM | POA: Insufficient documentation

## 2016-06-03 DIAGNOSIS — E119 Type 2 diabetes mellitus without complications: Secondary | ICD-10-CM | POA: Diagnosis not present

## 2016-06-03 DIAGNOSIS — I10 Essential (primary) hypertension: Secondary | ICD-10-CM | POA: Diagnosis not present

## 2016-06-03 LAB — COMPREHENSIVE METABOLIC PANEL
ALBUMIN: 4.3 g/dL (ref 3.5–5.0)
ALK PHOS: 45 U/L (ref 38–126)
ALT: 27 U/L (ref 17–63)
ANION GAP: 9 (ref 5–15)
AST: 38 U/L (ref 15–41)
BUN: 8 mg/dL (ref 6–20)
CO2: 30 mmol/L (ref 22–32)
Calcium: 9.4 mg/dL (ref 8.9–10.3)
Chloride: 97 mmol/L — ABNORMAL LOW (ref 101–111)
Creatinine, Ser: 1.3 mg/dL — ABNORMAL HIGH (ref 0.61–1.24)
GFR calc Af Amer: >60 mL/min (ref >60)
GFR calc non Af Amer: 59 mL/min — ABNORMAL LOW (ref >60)
GLUCOSE: 162 mg/dL — AB (ref 65–99)
POTASSIUM: 3.2 mmol/L — AB (ref 3.5–5.1)
SODIUM: 136 mmol/L (ref 135–145)
Total Bilirubin: 0.8 mg/dL (ref 0.3–1.2)
Total Protein: 7.2 g/dL (ref 6.5–8.1)

## 2016-06-03 LAB — CBC WITH DIFFERENTIAL/PLATELET
BASOS ABS: 0 10*3/uL (ref 0.0–0.1)
Basophils Relative: 0 %
EOS ABS: 0 10*3/uL (ref 0.0–0.7)
EOS PCT: 1 %
HCT: 39 % (ref 39.0–52.0)
Hemoglobin: 13.8 g/dL (ref 13.0–17.0)
Lymphocytes Relative: 20 %
Lymphs Abs: 1.1 10*3/uL (ref 0.7–4.0)
MCH: 31.8 pg (ref 26.0–34.0)
MCHC: 35.4 g/dL (ref 30.0–36.0)
MCV: 89.9 fL (ref 78.0–100.0)
Monocytes Absolute: 0.3 10*3/uL (ref 0.1–1.0)
Monocytes Relative: 5 %
Neutro Abs: 3.9 10*3/uL (ref 1.7–7.7)
Neutrophils Relative %: 74 %
PLATELETS: 241 10*3/uL (ref 150–400)
RBC: 4.34 MIL/uL (ref 4.22–5.81)
RDW: 13 % (ref 11.5–15.5)
WBC: 5.3 10*3/uL (ref 4.0–10.5)

## 2016-06-03 LAB — I-STAT TROPONIN, ED: TROPONIN I, POC: 0 ng/mL (ref 0.00–0.08)

## 2016-06-03 LAB — BRAIN NATRIURETIC PEPTIDE: B Natriuretic Peptide: 43.5 pg/mL (ref 0.0–100.0)

## 2016-06-03 NOTE — ED Provider Notes (Signed)
[facility] DEPT Provider Note   CSN: [id number] Arrival date & time: 06/03/16  6073     History   Chief Complaint Chief Complaint  Patient presents with  . Shortness of Breath    HPI Miguel Powell is a 56 y.o. male.  Patient is a 56 year old male with past medical history of myasthenia gravis. He presents for evaluation of shortness of breath. This began several days ago. He denies any chest pain, fever, chills, or productive cough. He also reports some abdominal distention which is been ongoing for the past month. He was seen here one month ago with these complaints and underwent x-rays which were negative. He was advised by his gastroenterologist take MiraLAX which has helped somewhat.   The history is provided by the patient.  Shortness of Breath  This is a new problem. The average episode lasts 3 days. The problem occurs continuously.The problem has been gradually worsening. Pertinent negatives include no fever, no sputum production and no chest pain. He has tried nothing for the symptoms.    Past Medical History:  Diagnosis Date  . 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 Arthritis    neck  . Hypertension    states BP has been elevated recently; has been on med. since 1990s  . Myasthenia gravis ([place])   . Sinus drainage 02/03/2016  . Wears partial dentures    upper     Patient Active Problem List   Diagnosis Date Noted  . Abnormal TSH 11/09/2015  . Cough 02/23/2015  . Acute upper respiratory infection 02/05/2015  . Mixed hearing loss, bilateral 02/05/2015  . Encounter for therapeutic drug monitoring 12/18/2014  . LLQ pain 12/18/2014  . Lower back pain 10/28/2014  . Preventative health care 10/30/2011  . Myasthenia gravis ([place])   . 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 Diabetes ([place]) 05/16/2007  . Anxiety state 05/16/2007  . Allergic rhinitis 09/04/2006  . Essential hypertension 09/04/2006  . ERECTILE DYSFUNCTION 09/04/2006    Past Surgical  History:  Procedure Laterality Date  . LASER ABLATION OF CONDYLOMAS  11/04/2009  . [place]  . MINOR FULGERATION OF ANAL CONDYLOMA N/A 02/10/2016   Procedure: EXCISION AND FULGURATION OF ANAL CONDYLOMA;  Surgeon: Jackolyn Confer, MD;  Location: [facility];  Service: General;  Laterality: N/A;       Home Medications    Prior to Admission medications   Medication Sig Start Date End Date Taking? Authorizing Provider  azaTHIOprine (IMURAN) 50 MG tablet Take 200 mg by mouth daily.    Yes Historical Provider, MD  calcium-vitamin D (OSCAL-500) 500-400 MG-UNIT tablet Take 1 tablet by mouth daily. 500-600   Yes Historical Provider, MD  diphenhydrAMINE (BENADRYL) 25 MG tablet Take 25 mg by mouth every 6 (six) hours as needed for sleep.    Yes Historical Provider, MD  Multiple Vitamin (MULTIVITAMIN) tablet Take 1 tablet by mouth daily.   Yes Historical Provider, MD  olmesartan-hydrochlorothiazide (BENICAR HCT) 40-25 MG tablet Take 1 tablet by mouth daily. 04/17/16  Yes Biagio Borg, MD  predniSONE (DELTASONE) 2.5 MG tablet Take 12.5 mg by mouth every other day.   Yes Historical Provider, MD  pyridostigmine (MESTINON) 60 MG tablet Take 60 mg by mouth 4 (four) times daily.    Yes Historical Provider, MD  simethicone (GAS-X) 80 MG chewable tablet Chew 1 tablet (80 mg total) by mouth every 6 (six) hours as needed for flatulence. 05/12/16  Yes Carlisle Cater, PA-C  Family History Family History  Problem Relation Age of Onset  . Aneurysm Brother 33    ruptured aneurysm  . Diabetes Mother   . Diabetes Sister   . Cancer Sister     pancreatic  . Hypertension Sister     Social History Social History  Substance Use Topics  . Smoking status: Never Smoker  . Smokeless tobacco: Never Used  . Alcohol use Yes     Comment: occasionally     Allergies   Azithromycin; Botox [botulinum toxin type a]; Gentamycin [gentamicin]; Ketek [telithromycin]; Levaquin [levofloxacin  in d5w]; Magnesium-containing compounds; Neomycin; Penicillins; Procainamide; and Quinine derivatives   Review of Systems Review of Systems  Constitutional: Negative for fever.  Respiratory: Positive for shortness of breath. Negative for sputum production.   Cardiovascular: Negative for chest pain.  All other systems reviewed and are negative.    Physical Exam Updated Vital Signs BP (!) 138/96 (BP Location: Right Arm)   Pulse 90   Temp 98 F (36.7 C) (Oral)   Resp 17   SpO2 96%   Physical Exam  Constitutional: He is oriented to person, place, and time. He appears well-developed and well-nourished. No distress.  HENT:  Head: Normocephalic and atraumatic.  Mouth/Throat: Oropharynx is clear and moist.  Neck: Normal range of motion. Neck supple.  Cardiovascular: Normal rate and regular rhythm.  Exam reveals no friction rub.   No murmur heard. Pulmonary/Chest: Effort normal and breath sounds normal. No respiratory distress. He has no wheezes. He has no rales.  Abdominal: Soft. Bowel sounds are normal. He exhibits no distension. There is no tenderness.  Musculoskeletal: Normal range of motion. He exhibits no edema.  Neurological: He is alert and oriented to person, place, and time. Coordination normal.  Skin: Skin is warm and dry. He is not diaphoretic.  Nursing note and vitals reviewed.    ED Treatments / Results  Labs (all labs ordered are listed, but only abnormal results are displayed) Labs Reviewed  COMPREHENSIVE METABOLIC PANEL - Abnormal; Notable for the following:       Result Value   Potassium 3.2 (*)    Chloride 97 (*)    Glucose, Bld 162 (*)    Creatinine, Ser 1.30 (*)    GFR calc non Af Amer 59 (*)    All other components within normal limits  BRAIN NATRIURETIC PEPTIDE  CBC WITH DIFFERENTIAL/PLATELET  Randolm Idol, ED    EKG  EKG Interpretation None       Radiology Dg Chest 2 View  Result Date: 06/03/2016 CLINICAL DATA:  Patient with  shortness of breath and epigastric pain for 2 days. Wheezing. EXAM: CHEST  2 VIEW COMPARISON:  Chest radiograph 04/14/2016 FINDINGS: Normal cardiac and mediastinal contours. No consolidative pulmonary opacities. No pleural effusion or pneumothorax. Thoracic spine degenerative changes. IMPRESSION: No acute cardiopulmonary process. Electronically Signed   By: Lovey Newcomer M.D.   On: 06/03/2016 10:00   Dg Abdomen 1 View  Result Date: 06/03/2016 CLINICAL DATA:  Pt states his SOB worsens with laying flat, c/o "abd problems," chronically, worsening x 1 day. EXAM: ABDOMEN - 1 VIEW COMPARISON:  05/12/2016 FINDINGS: The bowel gas pattern is normal. No radio-opaque calculi or other significant radiographic abnormality are seen. IMPRESSION: Negative. Electronically Signed   By: Lajean Manes M.D.   On: 06/03/2016 11:01    Procedures Procedures (including critical care time)  Medications Ordered in ED  Medications - No data to display   Initial Impression / Assessment and Plan /  ED Course  I have reviewed the triage vital signs and the nursing notes.  Pertinent labs & imaging results that were available during my care of the patient were reviewed by me and considered in my medical decision making (see chart for details).  Workup reveals no evidence for an acute process. His chest x-ray is clear, BNP is negative, EKG is normal, and I highly doubt pulmonary embolism. He will be discharged, to follow up with his primary Dr. if symptoms are not improving in the next few days.  Final Clinical Impressions(s) / ED Diagnoses   Final diagnoses:  None    New Prescriptions New Prescriptions   No medications on file     Veryl Speak, MD 06/03/16 1323

## 2016-06-03 NOTE — Discharge Instructions (Signed)
Follow-up with your primary Dr. if symptoms are not improving in the next week.

## 2016-06-03 NOTE — ED Triage Notes (Addendum)
Pt reports SOB since yesterday that is worse with movement and lying on back. No CP or dizziness.  Hx of myasthenia gravis. Also reports ongoing abd issues that he is being treated outpatient for.

## 2016-06-06 ENCOUNTER — Encounter: Payer: Self-pay | Admitting: Internal Medicine

## 2016-06-06 ENCOUNTER — Ambulatory Visit (INDEPENDENT_AMBULATORY_CARE_PROVIDER_SITE_OTHER): Payer: BLUE CROSS/BLUE SHIELD | Admitting: Internal Medicine

## 2016-06-06 ENCOUNTER — Ambulatory Visit: Payer: BLUE CROSS/BLUE SHIELD | Admitting: Internal Medicine

## 2016-06-06 VITALS — BP 108/64 | HR 93 | Ht 72.0 in | Wt 250.0 lb

## 2016-06-06 DIAGNOSIS — F411 Generalized anxiety disorder: Secondary | ICD-10-CM

## 2016-06-06 DIAGNOSIS — Z794 Long term (current) use of insulin: Secondary | ICD-10-CM

## 2016-06-06 DIAGNOSIS — E0822 Diabetes mellitus due to underlying condition with diabetic chronic kidney disease: Secondary | ICD-10-CM

## 2016-06-06 DIAGNOSIS — R0600 Dyspnea, unspecified: Secondary | ICD-10-CM

## 2016-06-06 DIAGNOSIS — N259 Disorder resulting from impaired renal tubular function, unspecified: Secondary | ICD-10-CM | POA: Diagnosis not present

## 2016-06-06 MED ORDER — ESCITALOPRAM OXALATE 10 MG PO TABS: 10.0000 mg | ORAL_TABLET | Freq: Every day | ORAL | 3 refills | Status: DC

## 2016-06-06 NOTE — Progress Notes (Signed)
Subjective:    Patient ID: Miguel Powell, male    DOB: 1960/07/25, 56 y.o.   MRN: [id number]  HPI    Here after seen in ED apr 21 with past medical history of myasthenia gravis. He presented for evaluation of shortness of breath. He had been seen one month earlier with similar symptoms.  Workup revealed no evidence for an acute process. His chest x-ray is clear, BNP is negative, EKG is normal, and  highly doubted pulmonary embolism.   Pt denies fever, wt loss, night sweats, loss of appetite, or other constitutional symptoms  Wt has overall increased . Glc elevated at ED and prior, but several a1c recently normal Wt Readings from Last 3 Encounters:  06/06/16 250 lb (113.4 kg)  05/12/16 246 lb (111.6 kg)  04/14/16 245 lb (111.1 kg)  Labs o/w significant for mild CKD o/w negative.  Denies worsening depressive symptoms, suicidal ideation, or panic; has ongoing anxiety, and now accepts med for anxiety - will take hardcopy and f/u with neurology to make sure ok to take.  Pt denies chest pain, wheezing, orthopnea, PND, increased LE swelling, palpitations, dizziness or syncope. Past Medical History:  Diagnosis Date  . 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 Arthritis    neck  . Hypertension    states BP has been elevated recently; has been on med. since 1990s  . Myasthenia gravis ([place])   . Sinus drainage 02/03/2016  . Wears partial dentures    upper    Past Surgical History:  Procedure Laterality Date  . LASER ABLATION OF CONDYLOMAS  11/04/2009  . [place]  . MINOR FULGERATION OF ANAL CONDYLOMA N/A 02/10/2016   Procedure: EXCISION AND FULGURATION OF ANAL CONDYLOMA;  Surgeon: Jackolyn Confer, MD;  Location: [facility];  Service: General;  Laterality: N/A;    reports that he has never smoked. He has never used smokeless tobacco. He reports that he drinks alcohol. He reports that he does not use drugs. family history includes Aneurysm (age of onset: 9) in his brother;  Cancer in his sister; Diabetes in his mother and sister; Hypertension in his sister. Allergies  Allergen Reactions  . Azithromycin Other (See Comments)    DUE TO MYASTHENIA GRAVIS  . Botox [Botulinum Toxin Type A] Other (See Comments)    DUE TO MYASTHENIA GRAVIS  . Gentamycin [Gentamicin] Other (See Comments)    DUE TO MYASTHENIA GRAVIS  . Ketek [Telithromycin] Other (See Comments)    DUE TO MYASTHENIA GRAVIS  . Levaquin [Levofloxacin In D5w] Other (See Comments)    DUE TO MYASTHENIA GRAVIS  . Magnesium-Containing Compounds Other (See Comments)    DUE TO MYASTHENIA GRAVIS  . Neomycin Other (See Comments)    DUE TO MYASTHENIA GRAVIS  . Penicillins Other (See Comments)    DUE TO MYASTHENIA GRAVIS   . Procainamide Other (See Comments)    DUE TO MYASTHENIA GRAVIS  . Quinine Derivatives Other (See Comments)    DUE TO MYASTHENIA GRAVIS   Current Outpatient Prescriptions on File Prior to Visit  Medication Sig Dispense Refill  . azaTHIOprine (IMURAN) 50 MG tablet Take 200 mg by mouth daily.     . calcium-vitamin D (OSCAL-500) 500-400 MG-UNIT tablet Take 1 tablet by mouth daily. 500-600    . diphenhydrAMINE (BENADRYL) 25 MG tablet Take 25 mg by mouth every 6 (six) hours as needed for sleep.     . Multiple Vitamin (MULTIVITAMIN) tablet Take 1 tablet by mouth daily.    Marland Kitchen  olmesartan-hydrochlorothiazide (BENICAR HCT) 40-25 MG tablet Take 1 tablet by mouth daily. 60 tablet 11  . pyridostigmine (MESTINON) 60 MG tablet Take 60 mg by mouth 4 (four) times daily.     . simethicone (GAS-X) 80 MG chewable tablet Chew 1 tablet (80 mg total) by mouth every 6 (six) hours as needed for flatulence. 30 tablet 0   No current facility-administered medications on file prior to visit.    Review of Systems  Constitutional: Negative for other unusual diaphoresis or sweats HENT: Negative for ear discharge or swelling Eyes: Negative for other worsening visual disturbances Respiratory: Negative for stridor or  other swelling  Gastrointestinal: Negative for worsening distension or other blood Genitourinary: Negative for retention or other urinary change Musculoskeletal: Negative for other MSK pain or swelling Skin: Negative for color change or other new lesions Neurological: Negative for worsening tremors and other numbness  Psychiatric/Behavioral: Negative for worsening agitation or other fatigue All other system neg per pt    Objective:   Physical Exam BP 108/64   Pulse 93   Ht 6' (1.829 m)   Wt 250 lb (113.4 kg)   SpO2 99%   BMI 33.91 kg/m  VS noted,  Constitutional: Pt appears in NAD HENT: Head: NCAT.  Right Ear: External ear normal.  Left Ear: External ear normal.  Eyes: . Pupils are equal, round, and reactive to light. Conjunctivae and EOM are normal Nose: without d/c or deformity Neck: Neck supple. Gross normal ROM Cardiovascular: Normal rate and regular rhythm.   Pulmonary/Chest: Effort normal and breath sounds without rales or wheezing.  Abd:  Soft, NT, ND, + BS, no organomegaly Neurological: Pt is alert. At baseline orientation, motor grossly intact Skin: Skin is warm. No rashes, other new lesions, no LE edema Psychiatric: Pt behavior is normal without agitation . 1-2+ nervous No other exam findings  Lab Results  Component Value Date   WBC 5.3 06/03/2016   HGB 13.8 06/03/2016   HCT 39.0 06/03/2016   PLT 241 06/03/2016   GLUCOSE 162 (H) 06/03/2016   CHOL 163 10/15/2015   TRIG 79.0 10/15/2015   HDL 58.20 10/15/2015   LDLCALC 89 10/15/2015   ALT 27 06/03/2016   AST 38 06/03/2016   NA 136 06/03/2016   K 3.2 (L) 06/03/2016   CL 97 (L) 06/03/2016   CREATININE 1.30 (H) 06/03/2016   BUN 8 06/03/2016   CO2 30 06/03/2016   TSH 0.77 11/09/2015   PSA 2.30 04/14/2016   HGBA1C 6.3 04/14/2016   MICROALBUR <0.7 05/06/2015       Assessment & Plan:

## 2016-06-06 NOTE — Patient Instructions (Addendum)
Please take all new medication as prescribed -  The lexapro 10 mg per day (given in hardcopy)  Please continue all other medications as before, and refills have been done if requested.  Please have the pharmacy call with any other refills you may need.  Please keep your appointments with your specialists as you may have planned  Please return in 3 months, or sooner if needed

## 2016-06-06 NOTE — Assessment & Plan Note (Signed)
I suspect likley his primary issue, for lexapro 10 qd,  to f/u any worsening symptoms or concerns

## 2016-06-06 NOTE — Assessment & Plan Note (Signed)
Lab Results  Component Value Date   HGBA1C 6.3 04/14/2016  ,stable overall by history and exam, recent data reviewed with pt, and pt to continue medical treatment as before,  to f/u any worsening symptoms or concerns

## 2016-06-06 NOTE — Assessment & Plan Note (Signed)
With chronic persistent mild elev cr, stable overall by history and exam, recent data reviewed with pt, and pt to continue medical treatment as before,  to f/u any worsening symptoms or concerns Lab Results  Component Value Date   CREATININE 1.30 (H) 06/03/2016

## 2016-06-06 NOTE — Assessment & Plan Note (Signed)
Chronic recurrent with benign exams repeated, lab eval neg, cxr neg for acute, no hx of asthma and doubt PE; suspect combination or wt gain, deconditioning and anxiety, will cont to follow

## 2016-06-06 NOTE — Progress Notes (Signed)
Pre visit review using our clinic review tool, if applicable. No additional management support is needed unless otherwise documented below in the visit note. 

## 2016-06-07 ENCOUNTER — Ambulatory Visit: Payer: BLUE CROSS/BLUE SHIELD | Admitting: Internal Medicine

## 2016-06-27 ENCOUNTER — Telehealth: Payer: Self-pay | Admitting: Internal Medicine

## 2016-06-27 NOTE — Telephone Encounter (Signed)
Pt states his Neurologists thinks his olmesartan-hydrochlorothiazide (BENICAR HCT) 40-25 MG tablet Should go back to 12.5mg  due to a risk of hurting his kidneys. And states his A1c was normal.  Please call Pt back.

## 2016-06-27 NOTE — Telephone Encounter (Signed)
I checked his last visit with neurology may 7 at [facility] but there is no mention of this concern, only to not start the lexapro.  I would not think this was a highly intended recommendation, BP was good, so I would cont same med dosing

## 2016-06-28 NOTE — Telephone Encounter (Signed)
Called pt, LVM.   

## 2016-07-03 NOTE — Telephone Encounter (Signed)
Pt would like a call back regarding medications.

## 2016-07-04 ENCOUNTER — Telehealth: Payer: Self-pay | Admitting: Internal Medicine

## 2016-07-04 NOTE — Telephone Encounter (Signed)
Pt called back in

## 2016-07-04 NOTE — Telephone Encounter (Signed)
Pt called back in.  Call when you get a chance

## 2016-07-04 NOTE — Telephone Encounter (Signed)
Returned phone call, LVM.

## 2016-07-06 ENCOUNTER — Ambulatory Visit (INDEPENDENT_AMBULATORY_CARE_PROVIDER_SITE_OTHER): Payer: BLUE CROSS/BLUE SHIELD | Admitting: Internal Medicine

## 2016-07-06 ENCOUNTER — Encounter: Payer: Self-pay | Admitting: Internal Medicine

## 2016-07-06 VITALS — BP 134/78 | HR 71 | Temp 97.9°F | Ht 72.0 in | Wt 244.0 lb

## 2016-07-06 DIAGNOSIS — I10 Essential (primary) hypertension: Secondary | ICD-10-CM

## 2016-07-06 DIAGNOSIS — N179 Acute kidney failure, unspecified: Secondary | ICD-10-CM

## 2016-07-06 DIAGNOSIS — Z794 Long term (current) use of insulin: Secondary | ICD-10-CM

## 2016-07-06 DIAGNOSIS — E0822 Diabetes mellitus due to underlying condition with diabetic chronic kidney disease: Secondary | ICD-10-CM

## 2016-07-06 DIAGNOSIS — R972 Elevated prostate specific antigen [PSA]: Secondary | ICD-10-CM | POA: Insufficient documentation

## 2016-07-06 DIAGNOSIS — N182 Chronic kidney disease, stage 2 (mild): Secondary | ICD-10-CM | POA: Insufficient documentation

## 2016-07-06 MED ORDER — OLMESARTAN MEDOXOMIL-HCTZ 40-12.5 MG PO TABS: 1.0000 | ORAL_TABLET | Freq: Every day | ORAL | 3 refills | Status: DC

## 2016-07-06 NOTE — Progress Notes (Signed)
Subjective:    Patient ID: Miguel Powell, male    DOB: June 06, 1960, 56 y.o.   MRN: [id number]  HPI  Here to f/u after recent visit with neurology who tried by notes to reassure pt it was ok to start lexapro, but pt still will not try it.  Denies worsening depressive symptoms, suicidal ideation, or panic; has ongoing anxiety.  Has had some success with wt loss with better diet and activity; Pt denies chest pain, increased sob or doe, wheezing, orthopnea, PND, increased LE swelling, palpitations, dizziness or syncope.  Pt denies new neurological symptoms such as new headache, or facial or extremity weakness or numbness   Pt denies polydipsia, polyuria.  Did have recent lab with slight increased cr from 1.4 to 1.7, no repeat done, but was suggested he might need reduced HCT to avoid overdiuresis..   Denies urinary symptoms such as dysuria, frequency, urgency, flank pain, hematuria or n/v, fever, chills. Wt Readings from Last 3 Encounters:  07/06/16 244 lb (110.7 kg)  06/06/16 250 lb (113.4 kg)  05/12/16 246 lb (111.6 kg)   Past Medical History:  Diagnosis Date  . 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 Arthritis    neck  . Hypertension    states BP has been elevated recently; has been on med. since 1990s  . Myasthenia gravis ([place])   . Sinus drainage 02/03/2016  . Wears partial dentures    upper    Past Surgical History:  Procedure Laterality Date  . LASER ABLATION OF CONDYLOMAS  11/04/2009  . [place]  . MINOR FULGERATION OF ANAL CONDYLOMA N/A 02/10/2016   Procedure: EXCISION AND FULGURATION OF ANAL CONDYLOMA;  Surgeon: Jackolyn Confer, MD;  Location: [place];  Service: General;  Laterality: N/A;    reports that he has never smoked. He has never used smokeless tobacco. He reports that he drinks alcohol. He reports that he does not use drugs. family history includes Aneurysm (age of onset: 79) in his brother; Cancer in his sister; Diabetes in his mother and sister;  Hypertension in his sister. Allergies  Allergen Reactions  . Azithromycin Other (See Comments)    DUE TO MYASTHENIA GRAVIS  . Botox [Botulinum Toxin Type A] Other (See Comments)    DUE TO MYASTHENIA GRAVIS  . Gentamycin [Gentamicin] Other (See Comments)    DUE TO MYASTHENIA GRAVIS  . Ketek [Telithromycin] Other (See Comments)    DUE TO MYASTHENIA GRAVIS  . Levaquin [Levofloxacin In D5w] Other (See Comments)    DUE TO MYASTHENIA GRAVIS  . Magnesium-Containing Compounds Other (See Comments)    DUE TO MYASTHENIA GRAVIS  . Neomycin Other (See Comments)    DUE TO MYASTHENIA GRAVIS  . Penicillins Other (See Comments)    DUE TO MYASTHENIA GRAVIS   . Procainamide Other (See Comments)    DUE TO MYASTHENIA GRAVIS  . Quinine Derivatives Other (See Comments)    DUE TO MYASTHENIA GRAVIS  ] Current Outpatient Prescriptions on File Prior to Visit  Medication Sig Dispense Refill  . azaTHIOprine (IMURAN) 50 MG tablet Take 200 mg by mouth daily.     . calcium-vitamin D (OSCAL-500) 500-400 MG-UNIT tablet Take 1 tablet by mouth daily. 500-600    . diphenhydrAMINE (BENADRYL) 25 MG tablet Take 25 mg by mouth every 6 (six) hours as needed for sleep.     Marland Kitchen escitalopram (LEXAPRO) 10 MG tablet Take 1 tablet (10 mg total) by mouth daily. 90 tablet 3  . Multiple Vitamin (MULTIVITAMIN)  tablet Take 1 tablet by mouth daily.    Marland Kitchen pyridostigmine (MESTINON) 60 MG tablet Take 60 mg by mouth 4 (four) times daily.     . simethicone (GAS-X) 80 MG chewable tablet Chew 1 tablet (80 mg total) by mouth every 6 (six) hours as needed for flatulence. 30 tablet 0   No current facility-administered medications on file prior to visit.    Review of Systems  Constitutional: Negative for other unusual diaphoresis or sweats HENT: Negative for ear discharge or swelling Eyes: Negative for other worsening visual disturbances Respiratory: Negative for stridor or other swelling  Gastrointestinal: Negative for worsening distension  or other blood Genitourinary: Negative for retention or other urinary change Musculoskeletal: Negative for other MSK pain or swelling Skin: Negative for color change or other new lesions Neurological: Negative for worsening tremors and other numbness  Psychiatric/Behavioral: Negative for worsening agitation or other fatigue All other system neg per pt    Objective:   Physical Exam BP 134/78   Pulse 71   Temp 97.9 F (36.6 C) (Oral)   Ht 6' (1.829 m)   Wt 244 lb (110.7 kg)   SpO2 99%   BMI 33.09 kg/m  VS noted,  Constitutional: Pt appears in NAD HENT: Head: NCAT.  Right Ear: External ear normal.  Left Ear: External ear normal.  Eyes: . Pupils are equal, round, and reactive to light. Conjunctivae and EOM are normal Nose: without d/c or deformity Neck: Neck supple. Gross normal ROM Cardiovascular: Normal rate and regular rhythm.   Pulmonary/Chest: Effort normal and breath sounds without rales or wheezing.  Neurological: Pt is alert. At baseline orientation, motor grossly intact Skin: Skin is warm. No rashes, other new lesions, no LE edema Psychiatric: Pt behavior is normal without agitation, 1-2+ nervous  No other exam findings  Lab Results  Component Value Date   WBC 5.3 06/03/2016   HGB 13.8 06/03/2016   HCT 39.0 06/03/2016   PLT 241 06/03/2016   GLUCOSE 162 (H) 06/03/2016   CHOL 163 10/15/2015   TRIG 79.0 10/15/2015   HDL 58.20 10/15/2015   LDLCALC 89 10/15/2015   ALT 27 06/03/2016   AST 38 06/03/2016   NA 136 06/03/2016   K 3.2 (L) 06/03/2016   CL 97 (L) 06/03/2016   CREATININE 1.30 (H) 06/03/2016   BUN 8 06/03/2016   CO2 30 06/03/2016   TSH 0.77 11/09/2015   PSA 2.30 04/14/2016   HGBA1C 6.3 04/14/2016   MICROALBUR <0.7 05/06/2015       Assessment & Plan:

## 2016-07-06 NOTE — Patient Instructions (Addendum)
Ok to change the Benicar HCT form 40/25 mg , to the 40/12.5 mg daily  Please continue all other medications as before, and refills have been done if requested.  Please have the pharmacy call with any other refills you may need.  Please keep your appointments with your specialists as you may have planned  Please go to the LAB in the Basement (turn left off the elevator) for the tests to be done today  You will be contacted by phone if any changes need to be made immediately.  Otherwise, you will receive a letter about your results with an explanation, but please check with MyChart first.  Please remember to sign up for MyChart if you have not done so, as this will be important to you in the future with finding out test results, communicating by private email, and scheduling acute appointments online when needed.

## 2016-07-06 NOTE — Assessment & Plan Note (Signed)
stable overall by history and exam, recent data reviewed with pt, and pt to continue medical treatment as before,  to f/u any worsening symptoms or concerns BP Readings from Last 3 Encounters:  07/06/16 134/78  06/06/16 108/64  06/03/16 (!) 147/99

## 2016-07-06 NOTE — Assessment & Plan Note (Signed)
Mild for change benicar hct 40/25 to 40/12.5 qd , f/u lab today and next visit

## 2016-07-06 NOTE — Assessment & Plan Note (Signed)
With some ? Increase recently Lab Results  Component Value Date   PSA 2.30 04/14/2016   PSA 1.44 05/06/2015   PSA 1.63 06/11/2014   For f/u psa as well

## 2016-07-06 NOTE — Assessment & Plan Note (Signed)
Lab Results  Component Value Date   HGBA1C 6.3 04/14/2016   stable overall by history and exam, recent data reviewed with pt, and pt to continue medical treatment as before,  to f/u any worsening symptoms or concerns

## 2016-07-07 ENCOUNTER — Other Ambulatory Visit (INDEPENDENT_AMBULATORY_CARE_PROVIDER_SITE_OTHER): Payer: BLUE CROSS/BLUE SHIELD

## 2016-07-07 DIAGNOSIS — R972 Elevated prostate specific antigen [PSA]: Secondary | ICD-10-CM | POA: Diagnosis not present

## 2016-07-07 DIAGNOSIS — N179 Acute kidney failure, unspecified: Secondary | ICD-10-CM | POA: Diagnosis not present

## 2016-07-07 LAB — BASIC METABOLIC PANEL
BUN: 11 mg/dL (ref 6–23)
CO2: 31 mEq/L (ref 19–32)
Calcium: 9.6 mg/dL (ref 8.4–10.5)
Chloride: 102 mEq/L (ref 96–112)
Creatinine, Ser: 1.33 mg/dL (ref 0.40–1.50)
GFR: 71.1 mL/min (ref >60.00)
Glucose, Bld: 104 mg/dL — ABNORMAL HIGH (ref 70–99)
Potassium: 3.8 mEq/L (ref 3.5–5.1)
Sodium: 140 mEq/L (ref 135–145)

## 2016-07-07 LAB — PSA: PSA: 1.39 ng/mL (ref 0.10–4.00)

## 2016-09-05 ENCOUNTER — Ambulatory Visit: Payer: BLUE CROSS/BLUE SHIELD | Admitting: Internal Medicine

## 2016-09-12 ENCOUNTER — Encounter: Payer: Self-pay | Admitting: Internal Medicine

## 2016-09-12 ENCOUNTER — Ambulatory Visit (INDEPENDENT_AMBULATORY_CARE_PROVIDER_SITE_OTHER): Payer: BLUE CROSS/BLUE SHIELD | Admitting: Internal Medicine

## 2016-09-12 ENCOUNTER — Other Ambulatory Visit (INDEPENDENT_AMBULATORY_CARE_PROVIDER_SITE_OTHER): Payer: BLUE CROSS/BLUE SHIELD

## 2016-09-12 VITALS — BP 126/88 | HR 64 | Ht 72.0 in | Wt 240.0 lb

## 2016-09-12 DIAGNOSIS — E0822 Diabetes mellitus due to underlying condition with diabetic chronic kidney disease: Secondary | ICD-10-CM

## 2016-09-12 DIAGNOSIS — R972 Elevated prostate specific antigen [PSA]: Secondary | ICD-10-CM

## 2016-09-12 DIAGNOSIS — K219 Gastro-esophageal reflux disease without esophagitis: Secondary | ICD-10-CM | POA: Diagnosis not present

## 2016-09-12 DIAGNOSIS — Z794 Long term (current) use of insulin: Secondary | ICD-10-CM

## 2016-09-12 DIAGNOSIS — N182 Chronic kidney disease, stage 2 (mild): Secondary | ICD-10-CM

## 2016-09-12 LAB — HEMOGLOBIN A1C: Hgb A1c MFr Bld: 6.1 % (ref 4.6–6.5)

## 2016-09-12 LAB — BASIC METABOLIC PANEL WITH GFR
BUN: 12 mg/dL (ref 6–23)
CO2: 34 meq/L — ABNORMAL HIGH (ref 19–32)
Calcium: 9.5 mg/dL (ref 8.4–10.5)
Chloride: 101 meq/L (ref 96–112)
Creatinine, Ser: 1.25 mg/dL (ref 0.40–1.50)
GFR: 76.33 mL/min (ref >60.00)
Glucose, Bld: 92 mg/dL (ref 70–99)
Potassium: 4 meq/L (ref 3.5–5.1)
Sodium: 141 meq/L (ref 135–145)

## 2016-09-12 LAB — LIPID PANEL
CHOLESTEROL: 153 mg/dL (ref 0–200)
HDL: 56.1 mg/dL (ref >39.00)
LDL Cholesterol: 80 mg/dL (ref 0–99)
NonHDL: 96.99
TRIGLYCERIDES: 86 mg/dL (ref 0.0–149.0)
Total CHOL/HDL Ratio: 3
VLDL: 17.2 mg/dL (ref 0.0–40.0)

## 2016-09-12 LAB — TSH: TSH: 1.03 u[IU]/mL (ref 0.35–4.50)

## 2016-09-12 LAB — HEPATIC FUNCTION PANEL
ALT: 22 U/L (ref 0–53)
AST: 21 U/L (ref 0–37)
Albumin: 4.2 g/dL (ref 3.5–5.2)
Alkaline Phosphatase: 40 U/L (ref 39–117)
Bilirubin, Direct: 0.1 mg/dL (ref 0.0–0.3)
Total Bilirubin: 0.5 mg/dL (ref 0.2–1.2)
Total Protein: 6.7 g/dL (ref 6.0–8.3)

## 2016-09-12 LAB — PSA: PSA: 1.42 ng/mL (ref 0.10–4.00)

## 2016-09-12 NOTE — Progress Notes (Signed)
Subjective:    Patient ID: Miguel Powell, male    DOB: 1960-09-05, 56 y.o.   MRN: [id number]  HPI  Here to f/u recent evaluation at [facility] with GI upset; Rx zantac and astelin, TUMS and antifeflux precuations and askied to f/u here at 2 wks.  Pt states has complete resolution of his symptoms and now believes it was caused by vinegar in water has been drinking daily. Stopped the vinegar and water and no longer has pain, reflux , or even cough. Has some belching, but Denies urinary symptoms such as dysuria, frequency, urgency, flank pain, hematuria or n/v, fever, chills.   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  Is interested in f/u PSA and lipids today Past Medical History:  Diagnosis Date  . 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 Arthritis    neck  . Hypertension    states BP has been elevated recently; has been on med. since 1990s  . Myasthenia gravis ([place])   . Sinus drainage 02/03/2016  . Wears partial dentures    upper    Past Surgical History:  Procedure Laterality Date  . LASER ABLATION OF CONDYLOMAS  11/04/2009  . [place]  . MINOR FULGERATION OF ANAL CONDYLOMA N/A 02/10/2016   Procedure: EXCISION AND FULGURATION OF ANAL CONDYLOMA;  Surgeon: Jackolyn Confer, MD;  Location: [place];  Service: General;  Laterality: N/A;    reports that he has never smoked. He has never used smokeless tobacco. He reports that he drinks alcohol. He reports that he does not use drugs. family history includes Aneurysm (age of onset: 73) in his brother; Cancer in his sister; Diabetes in his mother and sister; Hypertension in his sister. Allergies  Allergen Reactions  . Azithromycin Other (See Comments)    DUE TO MYASTHENIA GRAVIS  . Botox [Botulinum Toxin Type A] Other (See Comments)    DUE TO MYASTHENIA  GRAVIS  . Gentamycin [Gentamicin] Other (See Comments)    DUE TO MYASTHENIA GRAVIS  . Ketek [Telithromycin] Other (See Comments)    DUE TO MYASTHENIA GRAVIS  . Levaquin [Levofloxacin In D5w] Other (See Comments)    DUE TO MYASTHENIA GRAVIS  . Magnesium-Containing Compounds Other (See Comments)    DUE TO MYASTHENIA GRAVIS  . Neomycin Other (See Comments)    DUE TO MYASTHENIA GRAVIS  . Penicillins Other (See Comments)    DUE TO MYASTHENIA GRAVIS   . Procainamide Other (See Comments)    DUE TO MYASTHENIA GRAVIS  . Quinine Derivatives Other (See Comments)    DUE TO MYASTHENIA GRAVIS   Current Outpatient Prescriptions on File Prior to Visit  Medication Sig Dispense Refill  . azaTHIOprine (IMURAN) 50 MG tablet Take 200 mg by mouth daily.     . calcium-vitamin D (OSCAL-500) 500-400 MG-UNIT tablet Take 1 tablet by mouth daily. 500-600    . diphenhydrAMINE (BENADRYL) 25 MG tablet Take 25 mg by mouth every 6 (six) hours as needed for sleep.     . Multiple Vitamin (MULTIVITAMIN) tablet Take 1 tablet by mouth daily.    Marland Kitchen olmesartan-hydrochlorothiazide (BENICAR HCT) 40-12.5 MG tablet Take 1 tablet by mouth daily. 90 tablet 3  . pyridostigmine (MESTINON) 60 MG tablet Take 60 mg by mouth 4 (four) times daily.     . simethicone (GAS-X)  80 MG chewable tablet Chew 1 tablet (80 mg total) by mouth every 6 (six) hours as needed for flatulence. 30 tablet 0  . escitalopram (LEXAPRO) 10 MG tablet Take 1 tablet (10 mg total) by mouth daily. 90 tablet 3   No current facility-administered medications on file prior to visit.    Review of Systems  Constitutional: Negative for other unusual diaphoresis or sweats HENT: Negative for ear discharge or swelling Eyes: Negative for other worsening visual disturbances Respiratory: Negative for stridor or other swelling  Gastrointestinal: Negative for worsening distension or other blood Genitourinary: Negative for retention or other urinary change Musculoskeletal:  Negative for other MSK pain or swelling Skin: Negative for color change or other new lesions Neurological: Negative for worsening tremors and other numbness  Psychiatric/Behavioral: Negative for worsening agitation or other fatigue All other system neg per pt    Objective:   Physical Exam BP 126/88   Pulse 64   Ht 6' (1.829 m)   Wt 240 lb (108.9 kg)   SpO2 99%   BMI 32.55 kg/m  VS noted,  Constitutional: Pt appears in NAD HENT: Head: NCAT.  Right Ear: External ear normal.  Left Ear: External ear normal.  Eyes: . Pupils are equal, round, and reactive to light. Conjunctivae and EOM are normal Nose: without d/c or deformity Neck: Neck supple. Gross normal ROM Cardiovascular: Normal rate and regular rhythm.   Pulmonary/Chest: Effort normal and breath sounds without rales or wheezing.  Abd:  Soft, NT, ND, + BS, no organomegaly Neurological: Pt is alert. At baseline orientation, motor grossly intact, left eyelid with severe upper lid drooping, chronic Skin: Skin is warm. No rashes, other new lesions, no LE edema Psychiatric: Pt behavior is normal without agitation , mild nervous  Just had labs at [facility] yesterday - cbc, cmp neg for significant acute change (noted on care everywhere)     Assessment & Plan:

## 2016-09-12 NOTE — Assessment & Plan Note (Signed)
stable overall by history and exam, recent data reviewed with pt, and pt to continue medical treatment as before,  to f/u any worsening symptoms or concerns, for f/u lab today Lab Results  Component Value Date   HGBA1C 6.3 04/14/2016

## 2016-09-12 NOTE — Assessment & Plan Note (Signed)
Resolved with anti reflux precautions, ok to cont to follow with prn tums

## 2016-09-12 NOTE — Patient Instructions (Addendum)
Please continue all other medications as before, and refills have been done if requested.  Please have the pharmacy call with any other refills you may need.  Please keep your appointments with your specialists as you may have planned  Please go to the LAB in the Basement (turn left off the elevator) for the tests to be done today  You will be contacted by phone if any changes need to be made immediately.  Otherwise, you will receive a letter about your results with an explanation, but please check with MyChart first.  Please remember to sign up for MyChart if you have not done so, as this will be important to you in the future with finding out test results, communicating by private email, and scheduling acute appointments online when needed.  Please return in 6 months, or sooner if needed 

## 2016-09-12 NOTE — Assessment & Plan Note (Signed)
Mild, may not be significant clinically, for f/u psa with labs today

## 2016-09-12 NOTE — Assessment & Plan Note (Signed)
Stable with cr 1.4 yesterday, cont to avoid nephrotoxins

## 2016-10-17 ENCOUNTER — Ambulatory Visit: Payer: BLUE CROSS/BLUE SHIELD | Admitting: Internal Medicine

## 2016-12-05 DIAGNOSIS — R29898 Other symptoms and signs involving the musculoskeletal system: Secondary | ICD-10-CM | POA: Insufficient documentation

## 2016-12-06 DIAGNOSIS — M461 Sacroiliitis, not elsewhere classified: Secondary | ICD-10-CM | POA: Insufficient documentation

## 2016-12-22 ENCOUNTER — Ambulatory Visit: Payer: BLUE CROSS/BLUE SHIELD | Admitting: Internal Medicine

## 2016-12-22 ENCOUNTER — Encounter: Payer: Self-pay | Admitting: Internal Medicine

## 2016-12-22 VITALS — BP 138/90 | HR 66 | Temp 97.8°F | Ht 72.0 in | Wt 239.0 lb

## 2016-12-22 DIAGNOSIS — I10 Essential (primary) hypertension: Secondary | ICD-10-CM

## 2016-12-22 DIAGNOSIS — F411 Generalized anxiety disorder: Secondary | ICD-10-CM

## 2016-12-22 DIAGNOSIS — E0822 Diabetes mellitus due to underlying condition with diabetic chronic kidney disease: Secondary | ICD-10-CM

## 2016-12-22 DIAGNOSIS — Z794 Long term (current) use of insulin: Secondary | ICD-10-CM | POA: Diagnosis not present

## 2016-12-22 MED ORDER — HYDROCHLOROTHIAZIDE 12.5 MG PO CAPS: 12.5000 mg | ORAL_CAPSULE | Freq: Every day | ORAL | 3 refills | Status: DC

## 2016-12-22 MED ORDER — ESCITALOPRAM OXALATE 10 MG PO TABS: 10.0000 mg | ORAL_TABLET | Freq: Every day | ORAL | 3 refills | Status: DC

## 2016-12-22 NOTE — Progress Notes (Signed)
Subjective:    Patient ID: Miguel Powell, male    DOB: April 14, 1960, 56 y.o.   MRN: [id number]  HPI  Here to f/u; overall doing ok,  Pt denies chest pain, increasing sob or doe, wheezing, orthopnea, PND, increased LE swelling, palpitations, dizziness or syncope.  Pt denies new neurological symptoms such as new headache, or facial or extremity weakness or numbness.  Pt denies polydipsia, polyuria, or low sugar episode.  Pt states overall good compliance with meds, mostly trying to follow appropriate diet, with wt overall stable,  but little exercise however. Seen and tx at [facility] with right SI joint pain, better with PT.  BP has been higher with more pain.  Pain better but BP remains in the 140/'s at home. Denies worsening depressive symptoms, suicidal ideation, or panic Past Medical History:  Diagnosis Date  . 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 Arthritis    neck  . Hypertension    states BP has been elevated recently; has been on med. since 1990s  . Myasthenia gravis ([place])   . Sinus drainage 02/03/2016  . Wears partial dentures    upper    Past Surgical History:  Procedure Laterality Date  . LASER ABLATION OF CONDYLOMAS  11/04/2009  . [place]    reports that  has never smoked. he has never used smokeless tobacco. He reports that he drinks alcohol. He reports that he does not use drugs. family history includes Aneurysm (age of onset: 76) in his brother; Cancer in his sister; Diabetes in his mother and sister; Hypertension in his sister. Allergies  Allergen Reactions  . Azithromycin Other (See Comments)    DUE TO MYASTHENIA GRAVIS  . Botox [Botulinum Toxin Type A] Other (See Comments)    DUE TO MYASTHENIA GRAVIS  . Gentamycin [Gentamicin] Other (See Comments)    DUE TO MYASTHENIA GRAVIS  . Ketek [Telithromycin] Other (See Comments)    DUE TO MYASTHENIA GRAVIS  . Levaquin [Levofloxacin In D5w] Other (See Comments)    DUE TO MYASTHENIA GRAVIS  .  Magnesium-Containing Compounds Other (See Comments)    DUE TO MYASTHENIA GRAVIS  . Neomycin Other (See Comments)    DUE TO MYASTHENIA GRAVIS  . Penicillins Other (See Comments)    DUE TO MYASTHENIA GRAVIS   . Procainamide Other (See Comments)    DUE TO MYASTHENIA GRAVIS  . Quinine Derivatives Other (See Comments)    DUE TO MYASTHENIA GRAVIS   Current Outpatient Medications on File Prior to Visit  Medication Sig Dispense Refill  . azaTHIOprine (IMURAN) 50 MG tablet Take 200 mg by mouth daily.     . calcium-vitamin D (OSCAL-500) 500-400 MG-UNIT tablet Take 1 tablet by mouth daily. 500-600    . diphenhydrAMINE (BENADRYL) 25 MG tablet Take 25 mg by mouth every 6 (six) hours as needed for sleep.     . Multiple Vitamin (MULTIVITAMIN) tablet Take 1 tablet by mouth daily.    Marland Kitchen olmesartan-hydrochlorothiazide (BENICAR HCT) 40-12.5 MG tablet Take 1 tablet by mouth daily. 90 tablet 3  . pyridostigmine (MESTINON) 60 MG tablet Take 60 mg by mouth 4 (four) times daily.     . simethicone (GAS-X) 80 MG chewable tablet Chew 1 tablet (80 mg total) by mouth every 6 (six) hours as needed for flatulence. 30 tablet 0   No current facility-administered medications on file prior to visit.    Review of Systems  Constitutional: Negative for other unusual diaphoresis or sweats HENT: Negative for ear discharge  or swelling Eyes: Negative for other worsening visual disturbances Respiratory: Negative for stridor or other swelling  Gastrointestinal: Negative for worsening distension or other blood Genitourinary: Negative for retention or other urinary change Musculoskeletal: Negative for other MSK pain or swelling Skin: Negative for color change or other new lesions Neurological: Negative for worsening tremors and other numbness  Psychiatric/Behavioral: Negative for worsening agitation or other fatigue All other system neg per pt    Objective:   Physical Exam BP 138/90   Pulse 66   Temp 97.8 F (36.6 C)  (Oral)   Ht 6' (1.829 m)   Wt 239 lb (108.4 kg)   SpO2 98%   BMI 32.41 kg/m  VS noted, not ill appearing Constitutional: Pt appears in NAD HENT: Head: NCAT.  Right Ear: External ear normal.  Left Ear: External ear normal.  Eyes: . Pupils are equal, round, and reactive to light. Conjunctivae and EOM are normal Nose: without d/c or deformity Neck: Neck supple. Gross normal ROM Cardiovascular: Normal rate and regular rhythm.   Pulmonary/Chest: Effort normal and breath sounds without rales or wheezing.  Neurological: Pt is alert. At baseline orientation, motor grossly intact Skin: Skin is warm. No rashes, other new lesions, no LE edema Psychiatric: Pt behavior is normal without agitation , not depressed affect, mild nervous only No other exam findings Lab Results  Component Value Date   WBC 5.3 06/03/2016   HGB 13.8 06/03/2016   HCT 39.0 06/03/2016   PLT 241 06/03/2016   GLUCOSE 92 09/12/2016   CHOL 153 09/12/2016   TRIG 86.0 09/12/2016   HDL 56.10 09/12/2016   LDLCALC 80 09/12/2016   ALT 22 09/12/2016   AST 21 09/12/2016   NA 141 09/12/2016   K 4.0 09/12/2016   CL 101 09/12/2016   CREATININE 1.25 09/12/2016   BUN 12 09/12/2016   CO2 34 (H) 09/12/2016   TSH 1.03 09/12/2016   PSA 1.42 09/12/2016   HGBA1C 6.1 09/12/2016   MICROALBUR <0.7 05/06/2015       Assessment & Plan:

## 2016-12-22 NOTE — Patient Instructions (Signed)
OK to add the HCT 12.5 mg daily  Please continue all other medications as before, and refills have been done if requested - the generic lexapro  Please have the pharmacy call with any other refills you may need.  Please continue your efforts at being more active, low cholesterol diet, and weight control.  Please keep your appointments with your specialists as you may have planned  Your work form was filled out

## 2016-12-24 NOTE — Assessment & Plan Note (Signed)
Lab Results  Component Value Date   HGBA1C 6.1 09/12/2016  stable overall by history and exam, recent data reviewed with pt, and pt to continue medical treatment as before,  to f/u any worsening symptoms or concerns, declines further lab today

## 2016-12-24 NOTE — Assessment & Plan Note (Addendum)
BP Readings from Last 3 Encounters:  12/22/16 138/90  09/12/16 126/88  07/06/16 134/78  stable overall by history and exam, recent data reviewed with pt, and pt to continue medical treatment as before except to add HCT 12.5 daily,  to f/u any worsening symptoms or concerns

## 2016-12-24 NOTE — Assessment & Plan Note (Signed)
Stable, to continue the lexapro

## 2016-12-28 ENCOUNTER — Telehealth: Payer: Self-pay | Admitting: Internal Medicine

## 2016-12-28 MED ORDER — RANITIDINE HCL 150 MG PO TABS: 150.0000 mg | ORAL_TABLET | Freq: Two times a day (BID) | ORAL | 3 refills | Status: DC

## 2016-12-28 NOTE — Telephone Encounter (Signed)
Pt called requesting a refill on ranitidine 150mg  twice a day sent to  [facility] in [place] (Phone# [phone]). He said that this was prescribed at the ED.

## 2016-12-28 NOTE — Telephone Encounter (Signed)
Done erx 

## 2017-01-18 ENCOUNTER — Telehealth: Payer: Self-pay | Admitting: Internal Medicine

## 2017-01-18 ENCOUNTER — Other Ambulatory Visit: Payer: Self-pay | Admitting: Internal Medicine

## 2017-01-18 DIAGNOSIS — J301 Allergic rhinitis due to pollen: Secondary | ICD-10-CM

## 2017-01-18 MED ORDER — AZELASTINE HCL 0.1 % NA SOLN: 2.0000 | Freq: Two times a day (BID) | NASAL | 3 refills | Status: DC

## 2017-01-18 NOTE — Telephone Encounter (Signed)
Notified pt rx has been sent to [facility]../lmb 

## 2017-01-18 NOTE — Telephone Encounter (Signed)
MD is out of the office this week. Pls advise on msg below../lmb 

## 2017-01-18 NOTE — Telephone Encounter (Signed)
Pt. called to request a refill on Azelastine 0.11% Nasal Spray,  1 spray each nostril,  BID/ PRN.  Reported that he was seen at an Urgent Care in [place], in Sept., for post nasal drip and acid reflux.  Reported he was advised to stop Flonase, and start Azelastine, because it was a nonsteroidal nasal spray. Pt. stated he has been taking Prednisone, (although not noted on active medication list) and was given the nonsteroidal nasal spray, for this reason.   Advised that since Dr. Jenny Reichmann wasn't the original presriber of the nasal spray,  He may need to be seen in the office for this.  The pt. requested to ask Dr. Jenny Reichmann if her will prescribe it.  Will send message to Dr. Jenny Reichmann.  Pt. Agreed.

## 2017-01-18 NOTE — Telephone Encounter (Signed)
RX sent

## 2017-03-14 DIAGNOSIS — G729 Myopathy, unspecified: Secondary | ICD-10-CM | POA: Diagnosis not present

## 2017-03-14 DIAGNOSIS — Z79899 Other long term (current) drug therapy: Secondary | ICD-10-CM | POA: Diagnosis not present

## 2017-03-14 DIAGNOSIS — R29898 Other symptoms and signs involving the musculoskeletal system: Secondary | ICD-10-CM | POA: Diagnosis not present

## 2017-03-14 DIAGNOSIS — G7000 Myasthenia gravis without (acute) exacerbation: Secondary | ICD-10-CM | POA: Diagnosis not present

## 2017-03-15 ENCOUNTER — Other Ambulatory Visit (INDEPENDENT_AMBULATORY_CARE_PROVIDER_SITE_OTHER): Payer: Medicare HMO

## 2017-03-15 ENCOUNTER — Ambulatory Visit (INDEPENDENT_AMBULATORY_CARE_PROVIDER_SITE_OTHER): Payer: Medicare HMO | Admitting: Internal Medicine

## 2017-03-15 ENCOUNTER — Encounter: Payer: Self-pay | Admitting: Internal Medicine

## 2017-03-15 VITALS — BP 126/84 | HR 68 | Temp 97.7°F | Ht 72.0 in | Wt 245.0 lb

## 2017-03-15 DIAGNOSIS — N182 Chronic kidney disease, stage 2 (mild): Secondary | ICD-10-CM

## 2017-03-15 DIAGNOSIS — I10 Essential (primary) hypertension: Secondary | ICD-10-CM | POA: Diagnosis not present

## 2017-03-15 DIAGNOSIS — R5383 Other fatigue: Secondary | ICD-10-CM | POA: Diagnosis not present

## 2017-03-15 DIAGNOSIS — E559 Vitamin D deficiency, unspecified: Secondary | ICD-10-CM | POA: Diagnosis not present

## 2017-03-15 DIAGNOSIS — E0822 Diabetes mellitus due to underlying condition with diabetic chronic kidney disease: Secondary | ICD-10-CM | POA: Diagnosis not present

## 2017-03-15 DIAGNOSIS — Z794 Long term (current) use of insulin: Secondary | ICD-10-CM

## 2017-03-15 LAB — BASIC METABOLIC PANEL
BUN: 20 mg/dL (ref 6–23)
CO2: 35 mEq/L — ABNORMAL HIGH (ref 19–32)
CREATININE: 1.41 mg/dL (ref 0.40–1.50)
Calcium: 10.1 mg/dL (ref 8.4–10.5)
Chloride: 100 mEq/L (ref 96–112)
GFR: 66.31 mL/min (ref >60.00)
Glucose, Bld: 93 mg/dL (ref 70–99)
POTASSIUM: 3.8 meq/L (ref 3.5–5.1)
Sodium: 141 mEq/L (ref 135–145)

## 2017-03-15 LAB — TESTOSTERONE: Testosterone: 311.11 ng/dL (ref 300.00–890.00)

## 2017-03-15 LAB — VITAMIN D 25 HYDROXY (VIT D DEFICIENCY, FRACTURES): VITD: 54.06 ng/mL (ref 30.00–100.00)

## 2017-03-15 LAB — TSH: TSH: 0.57 u[IU]/mL (ref 0.35–4.50)

## 2017-03-15 LAB — HEMOGLOBIN A1C: Hgb A1c MFr Bld: 6.4 % (ref 4.6–6.5)

## 2017-03-15 LAB — T4, FREE: Free T4: 0.9 ng/dL (ref 0.60–1.60)

## 2017-03-15 NOTE — Assessment & Plan Note (Signed)
stable overall by history and exam, recent data reviewed with pt, and pt to continue medical treatment as before,  to f/u any worsening symptoms or concerns Lab Results  Component Value Date   CREATININE 1.41 03/15/2017

## 2017-03-15 NOTE — Progress Notes (Signed)
Subjective:    Patient ID: Miguel Powell, male    DOB: 03-Jun-1960, 57 y.o.   MRN: [id number]  HPI  Here to f/u; overall doing ok,  Pt denies chest pain, increasing sob or doe, wheezing, orthopnea, PND, increased LE swelling, palpitations, dizziness or syncope.  Pt denies new neurological symptoms such as new headache, or facial or extremity weakness or numbness.  Pt denies polydipsia, polyuria, or low sugar episode.  Pt states overall good compliance with meds, mostly trying to follow appropriate diet, with wt overall stable,  but little exercise however. Due for muscle biopsy soon per [facility] neurology after seen yesterday.   Twin brother low on Vit D, pt asks for level, also asks for TSH repeat due to weakness.  Does c/o ongoing fatigue, but denies signficant daytime hypersomnolence.  Remains nervous with multiple questions many repeated Past Medical History:  Diagnosis Date  . 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 Arthritis    neck  . Hypertension    states BP has been elevated recently; has been on med. since 1990s  . Myasthenia gravis ([place])   . Sinus drainage 02/03/2016  . Wears partial dentures    upper    Past Surgical History:  Procedure Laterality Date  . LASER ABLATION OF CONDYLOMAS  11/04/2009  . [place]  . MINOR FULGERATION OF ANAL CONDYLOMA N/A 02/10/2016   Procedure: EXCISION AND FULGURATION OF ANAL CONDYLOMA;  Surgeon: Jackolyn Confer, MD;  Location: [facility];  Service: General;  Laterality: N/A;    reports that  has never smoked. he has never used smokeless tobacco. He reports that he drinks alcohol. He reports that he does not use drugs. family history includes Aneurysm (age of onset: 37) in his brother; Cancer in his sister; Diabetes in his mother and sister; Hypertension in his sister. Allergies  Allergen Reactions  . Azithromycin Other (See Comments)    DUE TO MYASTHENIA GRAVIS  . Botox [Botulinum Toxin Type A] Other (See Comments)    DUE  TO MYASTHENIA GRAVIS  . Gentamycin [Gentamicin] Other (See Comments)    DUE TO MYASTHENIA GRAVIS  . Ketek [Telithromycin] Other (See Comments)    DUE TO MYASTHENIA GRAVIS  . Levaquin [Levofloxacin In D5w] Other (See Comments)    DUE TO MYASTHENIA GRAVIS  . Magnesium-Containing Compounds Other (See Comments)    DUE TO MYASTHENIA GRAVIS  . Neomycin Other (See Comments)    DUE TO MYASTHENIA GRAVIS  . Penicillins Other (See Comments)    DUE TO MYASTHENIA GRAVIS   . Procainamide Other (See Comments)    DUE TO MYASTHENIA GRAVIS  . Quinine Derivatives Other (See Comments)    DUE TO MYASTHENIA GRAVIS   Current Outpatient Medications on File Prior to Visit  Medication Sig Dispense Refill  . azaTHIOprine (IMURAN) 50 MG tablet Take 200 mg by mouth daily.     Marland Kitchen azelastine (ASTELIN) 0.1 % nasal spray Place 2 sprays into both nostrils 2 (two) times daily. Use in each nostril as directed 30 mL 3  . calcium-vitamin D (OSCAL-500) 500-400 MG-UNIT tablet Take 1 tablet by mouth daily. 500-600    . diphenhydrAMINE (BENADRYL) 25 MG tablet Take 25 mg by mouth every 6 (six) hours as needed for sleep.     Marland Kitchen escitalopram (LEXAPRO) 10 MG tablet Take 1 tablet (10 mg total) daily by mouth. 90 tablet 3  . hydrochlorothiazide (MICROZIDE) 12.5 MG capsule Take 1 capsule (12.5 mg total) daily by mouth. 90 capsule 3  .  Multiple Vitamin (MULTIVITAMIN) tablet Take 1 tablet by mouth daily.    Marland Kitchen olmesartan-hydrochlorothiazide (BENICAR HCT) 40-12.5 MG tablet Take 1 tablet by mouth daily. 90 tablet 3  . pyridostigmine (MESTINON) 60 MG tablet Take 60 mg by mouth 4 (four) times daily.     . ranitidine (ZANTAC) 150 MG tablet Take 1 tablet (150 mg total) 2 (two) times daily by mouth. 180 tablet 3  . simethicone (GAS-X) 80 MG chewable tablet Chew 1 tablet (80 mg total) by mouth every 6 (six) hours as needed for flatulence. 30 tablet 0   No current facility-administered medications on file prior to visit.    Review of Systems   Constitutional: Negative for other unusual diaphoresis or sweats HENT: Negative for ear discharge or swelling Eyes: Negative for other worsening visual disturbances Respiratory: Negative for stridor or other swelling  Gastrointestinal: Negative for worsening distension or other blood Genitourinary: Negative for retention or other urinary change Musculoskeletal: Negative for other MSK pain or swelling Skin: Negative for color change or other new lesions Neurological: Negative for worsening tremors and other numbness  Psychiatric/Behavioral: Negative for worsening agitation or other fatigue All other system neg per pt    Objective:   Physical Exam BP 126/84   Pulse 68   Temp 97.7 F (36.5 C) (Oral)   Ht 6' (1.829 m)   Wt 245 lb (111.1 kg)   SpO2 98%   BMI 33.23 kg/m  VS noted,  Constitutional: Pt appears in NAD HENT: Head: NCAT.  Right Ear: External ear normal.  Left Ear: External ear normal.  Eyes: . Pupils are equal, round, and reactive to light. Conjunctivae and EOM are normal Nose: without d/c or deformity Neck: Neck supple. Gross normal ROM Cardiovascular: Normal rate and regular rhythm.   Pulmonary/Chest: Effort normal and breath sounds without rales or wheezing.  Neurological: Pt is alert. At baseline orientation, motor grossly intact Skin: Skin is warm. No rashes, other new lesions, no LE edema Psychiatric: Pt behavior is normal without agitation , 2+ nervous No other exam findings     Assessment & Plan:

## 2017-03-15 NOTE — Assessment & Plan Note (Signed)
Also for vit d, testosterone, and TFT's.  to f/u any worsening symptoms or concerns

## 2017-03-15 NOTE — Patient Instructions (Addendum)
Please continue all other medications as before, and refills have been done if requested.  Please have the pharmacy call with any other refills you may need.  Please continue your efforts at being more active, low cholesterol diet, and weight control.  Please keep your appointments with your specialists as you may have planned  Please go to the LAB in the Basement (turn left off the elevator) for the tests to be done on Monday Feb 4  You will be contacted by phone if any changes need to be made immediately.  Otherwise, you will receive a letter about your results with an explanation, but please check with MyChart first.  Please remember to sign up for MyChart if you have not done so, as this will be important to you in the future with finding out test results, communicating by private email, and scheduling acute appointments online when needed.  Please return in 6 months, or sooner if needed

## 2017-03-15 NOTE — Assessment & Plan Note (Signed)
Lab Results  Component Value Date   HGBA1C 6.4 03/15/2017  stable overall by history and exam, recent data reviewed with pt, and pt to continue medical treatment as before,  to f/u any worsening symptoms or concerns

## 2017-03-15 NOTE — Assessment & Plan Note (Signed)
stable overall by history and exam, recent data reviewed with pt, and pt to continue medical treatment as before,  to f/u any worsening symptoms or concerns BP Readings from Last 3 Encounters:  03/15/17 126/84  12/22/16 138/90  09/12/16 126/88

## 2017-04-27 DIAGNOSIS — G7000 Myasthenia gravis without (acute) exacerbation: Secondary | ICD-10-CM | POA: Diagnosis not present

## 2017-04-27 DIAGNOSIS — H02402 Unspecified ptosis of left eyelid: Secondary | ICD-10-CM | POA: Diagnosis not present

## 2017-04-27 DIAGNOSIS — Z79899 Other long term (current) drug therapy: Secondary | ICD-10-CM | POA: Diagnosis not present

## 2017-04-27 DIAGNOSIS — H532 Diplopia: Secondary | ICD-10-CM | POA: Diagnosis not present

## 2017-05-24 ENCOUNTER — Other Ambulatory Visit (INDEPENDENT_AMBULATORY_CARE_PROVIDER_SITE_OTHER): Payer: Medicare HMO

## 2017-05-24 ENCOUNTER — Ambulatory Visit (INDEPENDENT_AMBULATORY_CARE_PROVIDER_SITE_OTHER): Payer: Medicare HMO | Admitting: Internal Medicine

## 2017-05-24 ENCOUNTER — Encounter: Payer: Self-pay | Admitting: Internal Medicine

## 2017-05-24 ENCOUNTER — Ambulatory Visit (INDEPENDENT_AMBULATORY_CARE_PROVIDER_SITE_OTHER)
Admission: RE | Admit: 2017-05-24 | Discharge: 2017-05-24 | Disposition: A | Discharge status: Home / Self Care | Payer: Medicare HMO | Source: Ambulatory Visit | Attending: Internal Medicine | Admitting: Internal Medicine

## 2017-05-24 VITALS — BP 126/84 | HR 84 | Temp 98.1°F | Ht 72.0 in | Wt 245.0 lb

## 2017-05-24 DIAGNOSIS — G7000 Myasthenia gravis without (acute) exacerbation: Secondary | ICD-10-CM | POA: Diagnosis not present

## 2017-05-24 DIAGNOSIS — I10 Essential (primary) hypertension: Secondary | ICD-10-CM | POA: Diagnosis not present

## 2017-05-24 DIAGNOSIS — R05 Cough: Secondary | ICD-10-CM

## 2017-05-24 DIAGNOSIS — R059 Cough, unspecified: Secondary | ICD-10-CM

## 2017-05-24 DIAGNOSIS — J301 Allergic rhinitis due to pollen: Secondary | ICD-10-CM

## 2017-05-24 LAB — CBC WITH DIFFERENTIAL/PLATELET
Basophils Absolute: 0 10*3/uL (ref 0.0–0.1)
Basophils Relative: 1.1 % (ref 0.0–3.0)
EOS PCT: 1.8 % (ref 0.0–5.0)
Eosinophils Absolute: 0.1 10*3/uL (ref 0.0–0.7)
HCT: 42.5 % (ref 39.0–52.0)
HEMOGLOBIN: 14.6 g/dL (ref 13.0–17.0)
LYMPHS ABS: 1.9 10*3/uL (ref 0.7–4.0)
Lymphocytes Relative: 49.2 % — ABNORMAL HIGH (ref 12.0–46.0)
MCHC: 34.4 g/dL (ref 30.0–36.0)
MCV: 90.8 fl (ref 78.0–100.0)
MONOS PCT: 12.2 % — AB (ref 3.0–12.0)
Monocytes Absolute: 0.5 10*3/uL (ref 0.1–1.0)
NEUTROS PCT: 35.7 % — AB (ref 43.0–77.0)
Neutro Abs: 1.4 10*3/uL (ref 1.4–7.7)
Platelets: 250 10*3/uL (ref 150.0–400.0)
RBC: 4.68 Mil/uL (ref 4.22–5.81)
RDW: 13.3 % (ref 11.5–15.5)
WBC: 3.8 10*3/uL — ABNORMAL LOW (ref 4.0–10.5)

## 2017-05-24 LAB — BASIC METABOLIC PANEL
BUN: 14 mg/dL (ref 6–23)
CO2: 33 mEq/L — ABNORMAL HIGH (ref 19–32)
Calcium: 9.8 mg/dL (ref 8.4–10.5)
Chloride: 100 mEq/L (ref 96–112)
Creatinine, Ser: 1.3 mg/dL (ref 0.40–1.50)
GFR: 72.77 mL/min (ref >60.00)
GLUCOSE: 100 mg/dL — AB (ref 70–99)
POTASSIUM: 4.2 meq/L (ref 3.5–5.1)
SODIUM: 137 meq/L (ref 135–145)

## 2017-05-24 LAB — HEPATIC FUNCTION PANEL
ALT: 18 U/L (ref 0–53)
AST: 24 U/L (ref 0–37)
Albumin: 4.2 g/dL (ref 3.5–5.2)
Alkaline Phosphatase: 38 U/L — ABNORMAL LOW (ref 39–117)
BILIRUBIN TOTAL: 0.6 mg/dL (ref 0.2–1.2)
Bilirubin, Direct: 0 mg/dL (ref 0.0–0.3)
Total Protein: 7.1 g/dL (ref 6.0–8.3)

## 2017-05-24 MED ORDER — FEXOFENADINE HCL 180 MG PO TABS: 180.0000 mg | ORAL_TABLET | Freq: Every day | ORAL | 2 refills | Status: DC

## 2017-05-24 NOTE — Assessment & Plan Note (Signed)
[place] for labs today and monhtly as requested

## 2017-05-24 NOTE — Assessment & Plan Note (Signed)
stable overall by history and exam, recent data reviewed with pt, and pt to continue medical treatment as before,  to f/u any worsening symptoms or concern BP Readings from Last 3 Encounters:  05/24/17 126/84  03/15/17 126/84  12/22/16 138/90

## 2017-05-24 NOTE — Progress Notes (Signed)
Subjective:    Patient ID: Miguel Powell, male    DOB: 05-21-1960, 56 y.o.   MRN: [id number]  HPI here to f/u, states he was told by [facility] that with his prior stable labs and immuran recently decreased, it is felt that he can follow labs monthly here (cbc, cmet).  States no recent neuro changes - .Pt denies new neurological symptoms such as new headache, or facial or extremity weakness or numbness  Pt denies chest pain, increased sob or doe, wheezing, orthopnea, PND, increased LE swelling, palpitations, dizziness or syncope, but has had cough associated with worsening allergies - Does have several wks ongoing nasal allergy symptoms with clearish congestion, itch and sneezing, without fever, pain, ST, swelling or wheezing.   Pt denies fever, wt loss, night sweats, loss of appetite, or other constitutional symptoms Past Medical History:  Diagnosis Date  . 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 Arthritis    neck  . Hypertension    states BP has been elevated recently; has been on med. since 1990s  . Myasthenia gravis ([place])   . Sinus drainage 02/03/2016  . Wears partial dentures    upper    Past Surgical History:  Procedure Laterality Date  . LASER ABLATION OF CONDYLOMAS  11/04/2009  . [place]  . MINOR FULGERATION OF ANAL CONDYLOMA N/A 02/10/2016   Procedure: EXCISION AND FULGURATION OF ANAL CONDYLOMA;  Surgeon: Jackolyn Confer, MD;  Location: [facility];  Service: General;  Laterality: N/A;    reports that he has never smoked. He has never used smokeless tobacco. He reports that he drinks alcohol. He reports that he does not use drugs. family history includes Aneurysm (age of onset: 58) in his brother; Cancer in his sister; Diabetes in his mother and sister; Hypertension in his sister. Allergies  Allergen Reactions  . Azithromycin Other (See Comments)    DUE TO MYASTHENIA GRAVIS  . Botox [Botulinum Toxin Type A] Other (See Comments)    DUE TO MYASTHENIA GRAVIS  .  Gentamycin [Gentamicin] Other (See Comments)    DUE TO MYASTHENIA GRAVIS  . Ketek [Telithromycin] Other (See Comments)    DUE TO MYASTHENIA GRAVIS  . Levaquin [Levofloxacin In D5w] Other (See Comments)    DUE TO MYASTHENIA GRAVIS  . Magnesium-Containing Compounds Other (See Comments)    DUE TO MYASTHENIA GRAVIS  . Neomycin Other (See Comments)    DUE TO MYASTHENIA GRAVIS  . Penicillins Other (See Comments)    DUE TO MYASTHENIA GRAVIS   . Procainamide Other (See Comments)    DUE TO MYASTHENIA GRAVIS  . Quinine Derivatives Other (See Comments)    DUE TO MYASTHENIA GRAVIS   Current Outpatient Medications on File Prior to Visit  Medication Sig Dispense Refill  . azaTHIOprine (IMURAN) 50 MG tablet Take 200 mg by mouth daily.     Marland Kitchen azelastine (ASTELIN) 0.1 % nasal spray Place 2 sprays into both nostrils 2 (two) times daily. Use in each nostril as directed 30 mL 3  . calcium-vitamin D (OSCAL-500) 500-400 MG-UNIT tablet Take 1 tablet by mouth daily. 500-600    . diphenhydrAMINE (BENADRYL) 25 MG tablet Take 25 mg by mouth every 6 (six) hours as needed for sleep.     . hydrochlorothiazide (MICROZIDE) 12.5 MG capsule Take 1 capsule (12.5 mg total) daily by mouth. 90 capsule 3  . Multiple Vitamin (MULTIVITAMIN) tablet Take 1 tablet by mouth daily.    Marland Kitchen olmesartan-hydrochlorothiazide (BENICAR HCT) 40-12.5 MG tablet Take 1  tablet by mouth daily. 90 tablet 3  . pyridostigmine (MESTINON) 60 MG tablet Take 60 mg by mouth 4 (four) times daily.     . ranitidine (ZANTAC) 150 MG tablet Take 1 tablet (150 mg total) 2 (two) times daily by mouth. 180 tablet 3  . simethicone (GAS-X) 80 MG chewable tablet Chew 1 tablet (80 mg total) by mouth every 6 (six) hours as needed for flatulence. 30 tablet 0  . escitalopram (LEXAPRO) 10 MG tablet Take 1 tablet (10 mg total) daily by mouth. 90 tablet 3   No current facility-administered medications on file prior to visit.    Review of Systems  Constitutional: Negative  for other unusual diaphoresis or sweats HENT: Negative for ear discharge or swelling Eyes: Negative for other worsening visual disturbances Respiratory: Negative for Bilat tm's with mild erythema.  Max sinus areas mild tender.  Pharynx with mild erythema, no exudate  stridor or other swelling  Gastrointestinal: Negative for worsening distension or other blood Genitourinary: Negative for retention or other urinary change Musculoskeletal: Negative for other MSK pain or swelling Skin: Negative for color change or other new lesions Neurological: Negative for worsening tremors and other numbness  Psychiatric/Behavioral: Negative for worsening agitation or other fatigue All other system neg per pt    Objective:   Physical Exam BP 126/84   Pulse 84   Temp 98.1 F (36.7 C) (Oral)   Ht 6' (1.829 m)   Wt 245 lb (111.1 kg)   SpO2 96%   BMI 33.23 kg/m  VS noted,  Constitutional: Pt appears in NAD HENT: Head: NCAT.  Right Ear: External ear normal.  Left Ear: External ear normal.  Eyes: . Pupils are equal, round, and reactive to light. Conjunctivae and EOM are normal Nose: without d/c or deformity Bilat tm's with mild erythema.  Max sinus areas non tender.  Pharynx with mild erythema, no exudate Neck: Neck supple. Gross normal ROM Cardiovascular: Normal rate and regular rhythm.   Pulmonary/Chest: Effort normal and breath sounds without rales or wheezing.  Abd:  Soft, NT, ND, + BS, no organomegaly Neurological: Pt is alert. At baseline orientation, motor grossly intact Skin: Skin is warm. No rashes, other new lesions, no LE edema Psychiatric: Pt behavior is normal without agitation  No other exam findings     Assessment & Plan:

## 2017-05-24 NOTE — Patient Instructions (Addendum)
Please take all new medication as prescribed - the allegra for allergies  You can also take Mucinex (or it's generic off brand) for congestion, and tylenol as needed for pain.  Please continue all other medications as before, and refills have been done if requested.  Please have the pharmacy call with any other refills you may need.  Please continue your efforts at being more active, low cholesterol diet, and weight control.  Please keep your appointments with your specialists as you may have planned  Please go to the XRAY Department in the Basement (go straight as you get off the elevator) for the x-ray testing  Please go to the LAB in the Basement (turn left off the elevator) for the tests to be done today  You will be contacted by phone if any changes need to be made immediately.  Otherwise, you will receive a letter about your results with an explanation, but please check with MyChart first.

## 2017-05-24 NOTE — Assessment & Plan Note (Signed)
Exam benign, also for cxr given immuran use

## 2017-05-24 NOTE — Assessment & Plan Note (Signed)
Ok for [employer] prn,  to f/u any worsening symptoms or concerns

## 2017-07-10 ENCOUNTER — Telehealth: Payer: Self-pay

## 2017-07-10 ENCOUNTER — Telehealth: Payer: Self-pay | Admitting: Internal Medicine

## 2017-07-10 ENCOUNTER — Other Ambulatory Visit: Payer: Self-pay | Admitting: Internal Medicine

## 2017-07-10 NOTE — Telephone Encounter (Signed)
Called pt, LVM to call back to discuss. 

## 2017-07-10 NOTE — Telephone Encounter (Deleted)
Copied from [place] [id number]. Topic: Inquiry >> Jul 10, 2017 12:36 PM Pricilla Handler wrote: Reason for CRM: Patient called requesting a refill of Olmesartan-hydrochlorothiazide (BENICAR HCT) 40-12.5 MG tablet. Patient did contact his pharmacy. This patient is completely out of his medication for blood pressure. Patient's preferred pharmacy is [facility] ([place]) [address] [place] - [address] [phone] (Phone)   [phone] (Fax).       Thank You!!!

## 2017-07-10 NOTE — Telephone Encounter (Signed)
This is slightly elevated but BP per emr are not BP Readings from Last 3 Encounters:  05/24/17 126/84  03/15/17 126/84  12/22/16 138/90   Please continue to monitor, as this could just a temporary increase

## 2017-07-10 NOTE — Telephone Encounter (Signed)
Copied from [place] [id number]. Topic: Inquiry >> Jul 10, 2017 12:36 PM Pricilla Handler wrote: Reason for CRM: Patient called requesting a refill of Olmesartan-hydrochlorothiazide (BENICAR HCT) 40-12.5 MG tablet. Patient did contact his pharmacy. This patient is completely out of his medication for blood pressure. Patient's preferred pharmacy is [facility] [address] [place] - [address] [phone] (Phone)   [phone] (Fax).       Thank You!!!

## 2017-07-10 NOTE — Telephone Encounter (Signed)
Copied from [place] [id number]. Topic: General - Other >> Jul 10, 2017 12:40 PM Pricilla Handler wrote: Reason for CRM: Patient wants Dr. Jenny Reichmann to know that his bp was 132/96.       Thank You!!!

## 2017-07-10 NOTE — Telephone Encounter (Signed)
Request already approved and sent back to pof.Marland KitchenJohny Powell

## 2017-07-26 DIAGNOSIS — S29012A Strain of muscle and tendon of back wall of thorax, initial encounter: Secondary | ICD-10-CM | POA: Diagnosis not present

## 2017-07-26 DIAGNOSIS — M546 Pain in thoracic spine: Secondary | ICD-10-CM | POA: Diagnosis not present

## 2017-07-26 DIAGNOSIS — M549 Dorsalgia, unspecified: Secondary | ICD-10-CM | POA: Diagnosis not present

## 2017-08-06 ENCOUNTER — Telehealth: Payer: Self-pay | Admitting: Internal Medicine

## 2017-08-06 DIAGNOSIS — Z0000 Encounter for general adult medical examination without abnormal findings: Secondary | ICD-10-CM

## 2017-08-06 NOTE — Telephone Encounter (Signed)
Patient requesting CBC, BMP, CMP, TSH and thymus

## 2017-08-06 NOTE — Telephone Encounter (Signed)
[facility] for labs (CPX labs) but please let pt know that I do not normally order myasthenia gravis or thymus related labs as this is outside of my scope of practice.  Those types of labs would need done at [facility]. thanks

## 2017-08-06 NOTE — Telephone Encounter (Signed)
Copied from [place] [id number]. Topic: Quick Communication - See Telephone Encounter >> Aug 06, 2017 10:43 AM Ahmed Prima L wrote: CRM for notification. See Telephone encounter for: 08/06/17.  Patient is requesting a complete blood count draw before his appointment on 6/26. Please advise. Call back is [phone]

## 2017-08-08 ENCOUNTER — Ambulatory Visit (INDEPENDENT_AMBULATORY_CARE_PROVIDER_SITE_OTHER): Payer: Medicare HMO | Admitting: Internal Medicine

## 2017-08-08 ENCOUNTER — Ambulatory Visit: Payer: Medicare HMO | Admitting: Internal Medicine

## 2017-08-08 ENCOUNTER — Other Ambulatory Visit (INDEPENDENT_AMBULATORY_CARE_PROVIDER_SITE_OTHER): Payer: Medicare HMO

## 2017-08-08 ENCOUNTER — Encounter: Payer: Self-pay | Admitting: Internal Medicine

## 2017-08-08 VITALS — BP 128/80 | HR 95 | Ht 72.0 in | Wt 247.0 lb

## 2017-08-08 DIAGNOSIS — E0822 Diabetes mellitus due to underlying condition with diabetic chronic kidney disease: Secondary | ICD-10-CM

## 2017-08-08 DIAGNOSIS — M545 Low back pain, unspecified: Secondary | ICD-10-CM

## 2017-08-08 DIAGNOSIS — Z0001 Encounter for general adult medical examination with abnormal findings: Secondary | ICD-10-CM

## 2017-08-08 DIAGNOSIS — Z114 Encounter for screening for human immunodeficiency virus [HIV]: Secondary | ICD-10-CM

## 2017-08-08 DIAGNOSIS — Z794 Long term (current) use of insulin: Secondary | ICD-10-CM | POA: Diagnosis not present

## 2017-08-08 DIAGNOSIS — J301 Allergic rhinitis due to pollen: Secondary | ICD-10-CM | POA: Diagnosis not present

## 2017-08-08 DIAGNOSIS — Z0000 Encounter for general adult medical examination without abnormal findings: Secondary | ICD-10-CM | POA: Diagnosis not present

## 2017-08-08 DIAGNOSIS — G7000 Myasthenia gravis without (acute) exacerbation: Secondary | ICD-10-CM | POA: Diagnosis not present

## 2017-08-08 DIAGNOSIS — Z79899 Other long term (current) drug therapy: Secondary | ICD-10-CM | POA: Diagnosis not present

## 2017-08-08 DIAGNOSIS — K219 Gastro-esophageal reflux disease without esophagitis: Secondary | ICD-10-CM

## 2017-08-08 LAB — CBC WITH DIFFERENTIAL/PLATELET
Basophils Absolute: 0 10*3/uL (ref 0.0–0.1)
Basophils Relative: 0.7 % (ref 0.0–3.0)
EOS PCT: 1.7 % (ref 0.0–5.0)
Eosinophils Absolute: 0.1 10*3/uL (ref 0.0–0.7)
HEMATOCRIT: 42.8 % (ref 39.0–52.0)
HEMOGLOBIN: 14.7 g/dL (ref 13.0–17.0)
LYMPHS PCT: 33.6 % (ref 12.0–46.0)
Lymphs Abs: 1.3 10*3/uL (ref 0.7–4.0)
MCHC: 34.3 g/dL (ref 30.0–36.0)
MCV: 91.6 fl (ref 78.0–100.0)
MONOS PCT: 8.1 % (ref 3.0–12.0)
Monocytes Absolute: 0.3 10*3/uL (ref 0.1–1.0)
Neutro Abs: 2.1 10*3/uL (ref 1.4–7.7)
Neutrophils Relative %: 55.9 % (ref 43.0–77.0)
Platelets: 247 10*3/uL (ref 150.0–400.0)
RBC: 4.67 Mil/uL (ref 4.22–5.81)
RDW: 13.6 % (ref 11.5–15.5)
WBC: 3.8 10*3/uL — AB (ref 4.0–10.5)

## 2017-08-08 LAB — COMPREHENSIVE METABOLIC PANEL
ALBUMIN: 4.4 g/dL (ref 3.5–5.2)
ALK PHOS: 36 U/L — AB (ref 39–117)
ALT: 26 U/L (ref 0–53)
AST: 24 U/L (ref 0–37)
BUN: 12 mg/dL (ref 6–23)
CHLORIDE: 101 meq/L (ref 96–112)
CO2: 31 mEq/L (ref 19–32)
CREATININE: 1.25 mg/dL (ref 0.40–1.50)
Calcium: 9.6 mg/dL (ref 8.4–10.5)
GFR: 76.09 mL/min (ref >60.00)
Glucose, Bld: 127 mg/dL — ABNORMAL HIGH (ref 70–99)
POTASSIUM: 3.6 meq/L (ref 3.5–5.1)
Sodium: 138 mEq/L (ref 135–145)
Total Bilirubin: 0.6 mg/dL (ref 0.2–1.2)
Total Protein: 7 g/dL (ref 6.0–8.3)

## 2017-08-08 LAB — HEMOGLOBIN A1C: Hgb A1c MFr Bld: 6.2 % (ref 4.6–6.5)

## 2017-08-08 LAB — PSA: PSA: 1.97 ng/mL (ref 0.10–4.00)

## 2017-08-08 LAB — MICROALBUMIN / CREATININE URINE RATIO
CREATININE, U: 28.6 mg/dL
Microalb Creat Ratio: 2.5 mg/g (ref 0.0–30.0)

## 2017-08-08 MED ORDER — AZELASTINE HCL 0.1 % NA SOLN: 2.0000 | Freq: Two times a day (BID) | NASAL | 11 refills | Status: DC

## 2017-08-08 MED ORDER — RANITIDINE HCL 150 MG PO TABS: 150.0000 mg | ORAL_TABLET | Freq: Two times a day (BID) | ORAL | 3 refills | Status: AC

## 2017-08-08 MED ORDER — CYCLOBENZAPRINE HCL 5 MG PO TABS: 5.0000 mg | ORAL_TABLET | Freq: Three times a day (TID) | ORAL | 1 refills | Status: DC | PRN

## 2017-08-08 NOTE — Assessment & Plan Note (Signed)

## 2017-08-08 NOTE — Patient Instructions (Addendum)
Please take all new medication as prescribed - the flexeril  Please continue all other medications as before, and refills have been done if requested  - the nasal spray and zantac  Please have the pharmacy call with any other refills you may need.  Please continue your efforts at being more active, low cholesterol diet, and weight control.  You are otherwise up to date with prevention measures today.  Please keep your appointments with your specialists as you may have planned  Please go to the LAB in the Basement (turn left off the elevator) for the tests to be done today  You will be contacted by phone if any changes need to be made immediately.  Otherwise, you will receive a letter about your results with an explanation, but please check with MyChart first.  Please remember to sign up for MyChart if you have not done so, as this will be important to you in the future with finding out test results, communicating by private email, and scheduling acute appointments online when needed.  Please return in 6 months, or sooner if needed

## 2017-08-08 NOTE — Telephone Encounter (Signed)
Patient calling because he had labs drawn today but was told by the tech that not all of the orders he requested was ordered. Patient would like a call back from cma to review what labs were taken and what labs were missed.

## 2017-08-08 NOTE — Progress Notes (Signed)
Subjective:    Patient ID: Miguel Powell, male    DOB: 1960/07/15, 57 y.o.   MRN: [id number]  HPI Here for wellness and f/u;  Overall doing ok;  Pt denies Chest pain, worsening SOB, DOE, wheezing, orthopnea, PND, worsening LE edema, palpitations, dizziness or syncope.  Pt denies neurological change such as new headache, facial or extremity weakness.  Pt denies polydipsia, polyuria, or low sugar symptoms. Pt states overall good compliance with treatment and medications, good tolerability, and has been trying to follow appropriate diet.  Pt denies worsening depressive symptoms, suicidal ideation or panic. No fever, night sweats, wt loss, loss of appetite, or other constitutional symptoms.  Pt states good ability with ADL's, has low fall risk, home safety reviewed and adequate, no other significant changes in hearing or vision, and only occasionally active with exercise.  See neurology on regular basis. Due for labs today.  Does have several wks ongoing nasal allergy symptoms with clearish congestion, itch and sneezing, without fever, pain, ST, cough, swelling or wheezing.  Also with 2 wks mild to mod LBP worse to standup. Left LBP better with flexeril prn, needs refill, Pt continues to have recurring LBP without change in severity, bowel or bladder change, fever, wt loss,  worsening LE pain/numbness/weakness, gait change or falls.  Denies worsening reflux, abd pain, dysphagia, n/v, bowel change or blood, but needs refill of zantac as this also helps the cough Past Medical History:  Diagnosis Date  . 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 Arthritis    neck  . Hypertension    states BP has been elevated recently; has been on med. since 1990s  . Myasthenia gravis ([place])   . Sinus drainage 02/03/2016  . Wears partial dentures    upper    Past Surgical History:  Procedure Laterality Date  . LASER ABLATION OF CONDYLOMAS  11/04/2009  . [place]  . MINOR FULGERATION OF ANAL CONDYLOMA N/A  02/10/2016   Procedure: EXCISION AND FULGURATION OF ANAL CONDYLOMA;  Surgeon: Jackolyn Confer, MD;  Location: [facility];  Service: General;  Laterality: N/A;    reports that he has never smoked. He has never used smokeless tobacco. He reports that he drinks alcohol. He reports that he does not use drugs. family history includes Aneurysm (age of onset: 69) in his brother; Cancer in his sister; Diabetes in his mother and sister; Hypertension in his sister. Allergies  Allergen Reactions  . Azithromycin Other (See Comments)    DUE TO MYASTHENIA GRAVIS  . Botox [Botulinum Toxin Type A] Other (See Comments)    DUE TO MYASTHENIA GRAVIS  . Gentamycin [Gentamicin] Other (See Comments)    DUE TO MYASTHENIA GRAVIS  . Ketek [Telithromycin] Other (See Comments)    DUE TO MYASTHENIA GRAVIS  . Levaquin [Levofloxacin In D5w] Other (See Comments)    DUE TO MYASTHENIA GRAVIS  . Magnesium-Containing Compounds Other (See Comments)    DUE TO MYASTHENIA GRAVIS  . Neomycin Other (See Comments)    DUE TO MYASTHENIA GRAVIS  . Penicillins Other (See Comments)    DUE TO MYASTHENIA GRAVIS   . Procainamide Other (See Comments)    DUE TO MYASTHENIA GRAVIS  . Quinine Derivatives Other (See Comments)    DUE TO MYASTHENIA GRAVIS   Current Outpatient Medications on File Prior to Visit  Medication Sig Dispense Refill  . azaTHIOprine (IMURAN) 50 MG tablet Take 200 mg by mouth daily.     . calcium-vitamin D (OSCAL-500) 500-400 MG-UNIT  tablet Take 1 tablet by mouth daily. 500-600    . diphenhydrAMINE (BENADRYL) 25 MG tablet Take 25 mg by mouth every 6 (six) hours as needed for sleep.     . fexofenadine (ALLEGRA) 180 MG tablet Take 1 tablet (180 mg total) by mouth daily. 30 tablet 2  . hydrochlorothiazide (MICROZIDE) 12.5 MG capsule Take 1 capsule (12.5 mg total) daily by mouth. 90 capsule 3  . Multiple Vitamin (MULTIVITAMIN) tablet Take 1 tablet by mouth daily.    Marland Kitchen olmesartan-hydrochlorothiazide  (BENICAR HCT) 40-12.5 MG tablet TAKE 1 TABLET BY MOUTH ONCE DAILY 30 tablet 11  . predniSONE (DELTASONE) 2.5 MG tablet TAKE 5 TABLETS BY MOUTH EVERY OTHER DAY    . pyridostigmine (MESTINON) 60 MG tablet Take 60 mg by mouth 4 (four) times daily.     . simethicone (GAS-X) 80 MG chewable tablet Chew 1 tablet (80 mg total) by mouth every 6 (six) hours as needed for flatulence. 30 tablet 0  . escitalopram (LEXAPRO) 10 MG tablet Take 1 tablet (10 mg total) daily by mouth. 90 tablet 3   No current facility-administered medications on file prior to visit.    Review of Systems Constitutional: Negative for other unusual diaphoresis, sweats, appetite or weight changes HENT: Negative for other worsening hearing loss, ear pain, facial swelling, mouth sores or neck stiffness.   Eyes: Negative for other worsening pain, redness or other visual disturbance.  Respiratory: Negative for other stridor or swelling Cardiovascular: Negative for other palpitations or other chest pain  Gastrointestinal: Negative for worsening diarrhea or loose stools, blood in stool, distention or other pain Genitourinary: Negative for hematuria, flank pain or other change in urine volume.  Musculoskeletal: Negative for myalgias or other joint swelling.  Skin: Negative for other color change, or other wound or worsening drainage.  Neurological: Negative for other syncope or numbness. Hematological: Negative for other adenopathy or swelling Psychiatric/Behavioral: Negative for hallucinations, other worsening agitation, SI, self-injury, or new decreased concentration All other system neg per pt    Objective:   Physical Exam BP 128/80 (BP Location: Left Arm, Patient Position: Sitting, Cuff Size: Large)   Pulse 95   Ht 6' (1.829 m)   Wt 247 lb (112 kg)   SpO2 98%   BMI 33.50 kg/m  VS noted, not ill appearing Constitutional: Pt is oriented to person, place, and time. Appears well-developed and well-nourished, in no significant  distress and comfortable Head: Normocephalic and atraumatic  Eyes: Conjunctivae and EOM are normal. Pupils are equal, round, and reactive to light Right Ear: External ear normal without discharge Left Ear: External ear normal without discharge Nose: Nose without discharge or deformity Bilat tm's with mild erythema.  Max sinus areas non tender.  Pharynx with mild erythema, no exudate Mouth/Throat: Oropharynx is without other ulcerations and moist  Neck: Normal range of motion. Neck supple. No JVD present. No tracheal deviation present or significant neck LA or mass Cardiovascular: Normal rate, regular rhythm, normal heart sounds and intact distal pulses. Pulmonary/Chest: WOB normal and breath sounds without rales or wheezing  Abdominal: Soft. Bowel sounds are normal. NT. No HSM  Musculoskeletal: Normal range of motion. Exhibits no edema Lymphadenopathy: Has no other cervical adenopathy.  Spine nontender; left lumbar paravertebral + tender spasm Neurological: Pt is alert and oriented to person, place, and time. Pt has normal reflexes. No cranial nerve deficit. Motor grossly intact, Gait intact Skin: Skin is warm and dry. No rash noted or new ulcerations Psychiatric:  Has normal mood and  affect. Behavior is normal without agitation No other exam findings Lab Results  Component Value Date   WBC 3.8 (L) 05/24/2017   HGB 14.6 05/24/2017   HCT 42.5 05/24/2017   PLT 250.0 05/24/2017   GLUCOSE 100 (H) 05/24/2017   CHOL 153 09/12/2016   TRIG 86.0 09/12/2016   HDL 56.10 09/12/2016   LDLCALC 80 09/12/2016   ALT 18 05/24/2017   AST 24 05/24/2017   NA 137 05/24/2017   K 4.2 05/24/2017   CL 100 05/24/2017   CREATININE 1.30 05/24/2017   BUN 14 05/24/2017   CO2 33 (H) 05/24/2017   TSH 0.57 03/15/2017   PSA 1.42 09/12/2016   HGBA1C 6.4 03/15/2017   MICROALBUR <0.7 05/06/2015       Assessment & Plan:

## 2017-08-08 NOTE — Assessment & Plan Note (Signed)
Mild, for restart zantac bid,  to f/u any worsening symptoms or concerns, this helps with cough as well

## 2017-08-08 NOTE — Assessment & Plan Note (Addendum)
C/w msk spasm/strain, no neuro changes, for flexeril 5 tid prn  In addition to the time spent performing CPE, I spent an additional 25 minutes face to face,in which greater than 50% of this time was spent in counseling and coordination of care for patient's acute illness as documented, including the differential dx, treatment, further evaluation and other management of low back pain, allergies, Dm and Jerrye Bushy

## 2017-08-08 NOTE — Telephone Encounter (Signed)
I faxed a add on sheet down to [facility] for: CBC and CMET. Left detailed mess informing pt.

## 2017-08-08 NOTE — Assessment & Plan Note (Signed)
stable overall by history and exam, recent data reviewed with pt, and pt to continue medical treatment as before,  to f/u any worsening symptoms or concerns  Lab Results  Component Value Date   HGBA1C 6.4 03/15/2017

## 2017-08-08 NOTE — Assessment & Plan Note (Signed)
Mild to mod, assoc with cough, for restart astelin,  to f/u any worsening symptoms or concerns

## 2017-08-09 LAB — HIV ANTIBODY (ROUTINE TESTING W REFLEX): HIV 1&2 Ab, 4th Generation: NONREACTIVE

## 2017-09-10 DIAGNOSIS — G7000 Myasthenia gravis without (acute) exacerbation: Secondary | ICD-10-CM | POA: Diagnosis not present

## 2017-09-10 DIAGNOSIS — G729 Myopathy, unspecified: Secondary | ICD-10-CM | POA: Diagnosis not present

## 2017-09-12 ENCOUNTER — Ambulatory Visit: Payer: Medicare HMO | Admitting: Internal Medicine

## 2017-09-13 ENCOUNTER — Ambulatory Visit: Payer: Medicare HMO | Admitting: Internal Medicine

## 2017-09-13 DIAGNOSIS — K5900 Constipation, unspecified: Secondary | ICD-10-CM | POA: Diagnosis not present

## 2017-09-13 DIAGNOSIS — K219 Gastro-esophageal reflux disease without esophagitis: Secondary | ICD-10-CM | POA: Diagnosis not present

## 2017-09-13 DIAGNOSIS — R140 Abdominal distension (gaseous): Secondary | ICD-10-CM | POA: Diagnosis not present

## 2017-09-13 DIAGNOSIS — Z0289 Encounter for other administrative examinations: Secondary | ICD-10-CM

## 2017-10-01 DIAGNOSIS — G729 Myopathy, unspecified: Secondary | ICD-10-CM | POA: Diagnosis not present

## 2017-10-01 DIAGNOSIS — G7000 Myasthenia gravis without (acute) exacerbation: Secondary | ICD-10-CM | POA: Diagnosis not present

## 2017-10-01 DIAGNOSIS — Z79899 Other long term (current) drug therapy: Secondary | ICD-10-CM | POA: Diagnosis not present

## 2017-11-30 ENCOUNTER — Telehealth: Payer: Self-pay | Admitting: Internal Medicine

## 2017-11-30 NOTE — Telephone Encounter (Signed)
Ok this is noted 

## 2017-11-30 NOTE — Telephone Encounter (Signed)
Copied from [place] [phone] [id number]. Topic: General - Other >> Nov 30, 2017  2:48 PM Lennox Solders wrote: Reason for CRM: pt is calling to let dr Jenny Reichmann know  his potassium is 3.6 and he is taking OTC potassium pill 550 mg for his muscle weakness. Pt started taking potassium pills on 11-26-17. Pt take one pill a day

## 2017-12-17 DIAGNOSIS — G7000 Myasthenia gravis without (acute) exacerbation: Secondary | ICD-10-CM | POA: Diagnosis not present

## 2017-12-17 DIAGNOSIS — Z79899 Other long term (current) drug therapy: Secondary | ICD-10-CM | POA: Diagnosis not present

## 2017-12-21 DIAGNOSIS — G7000 Myasthenia gravis without (acute) exacerbation: Secondary | ICD-10-CM | POA: Diagnosis not present

## 2017-12-21 DIAGNOSIS — Z7952 Long term (current) use of systemic steroids: Secondary | ICD-10-CM | POA: Diagnosis not present

## 2017-12-21 DIAGNOSIS — R29898 Other symptoms and signs involving the musculoskeletal system: Secondary | ICD-10-CM | POA: Diagnosis not present

## 2017-12-21 DIAGNOSIS — H532 Diplopia: Secondary | ICD-10-CM | POA: Diagnosis not present

## 2017-12-21 DIAGNOSIS — H02402 Unspecified ptosis of left eyelid: Secondary | ICD-10-CM | POA: Diagnosis not present

## 2018-01-04 ENCOUNTER — Ambulatory Visit: Payer: Medicare HMO | Admitting: Internal Medicine

## 2018-01-09 ENCOUNTER — Other Ambulatory Visit (INDEPENDENT_AMBULATORY_CARE_PROVIDER_SITE_OTHER): Payer: Medicare HMO

## 2018-01-09 ENCOUNTER — Ambulatory Visit (INDEPENDENT_AMBULATORY_CARE_PROVIDER_SITE_OTHER): Payer: Medicare HMO | Admitting: Internal Medicine

## 2018-01-09 ENCOUNTER — Encounter: Payer: Self-pay | Admitting: Internal Medicine

## 2018-01-09 VITALS — BP 124/86 | HR 89 | Temp 98.4°F | Ht 72.0 in | Wt 246.0 lb

## 2018-01-09 DIAGNOSIS — J301 Allergic rhinitis due to pollen: Secondary | ICD-10-CM

## 2018-01-09 DIAGNOSIS — Z794 Long term (current) use of insulin: Secondary | ICD-10-CM

## 2018-01-09 DIAGNOSIS — G7000 Myasthenia gravis without (acute) exacerbation: Secondary | ICD-10-CM

## 2018-01-09 DIAGNOSIS — H6123 Impacted cerumen, bilateral: Secondary | ICD-10-CM | POA: Diagnosis not present

## 2018-01-09 DIAGNOSIS — F528 Other sexual dysfunction not due to a substance or known physiological condition: Secondary | ICD-10-CM

## 2018-01-09 DIAGNOSIS — H9193 Unspecified hearing loss, bilateral: Secondary | ICD-10-CM

## 2018-01-09 DIAGNOSIS — E0822 Diabetes mellitus due to underlying condition with diabetic chronic kidney disease: Secondary | ICD-10-CM | POA: Diagnosis not present

## 2018-01-09 LAB — HEPATIC FUNCTION PANEL
ALBUMIN: 4.5 g/dL (ref 3.5–5.2)
ALT: 43 U/L (ref 0–53)
AST: 27 U/L (ref 0–37)
Alkaline Phosphatase: 38 U/L — ABNORMAL LOW (ref 39–117)
Bilirubin, Direct: 0.1 mg/dL (ref 0.0–0.3)
TOTAL PROTEIN: 7.3 g/dL (ref 6.0–8.3)
Total Bilirubin: 0.7 mg/dL (ref 0.2–1.2)

## 2018-01-09 LAB — LIPID PANEL
CHOLESTEROL: 191 mg/dL (ref 0–200)
HDL: 65.4 mg/dL (ref >39.00)
LDL Cholesterol: 106 mg/dL — ABNORMAL HIGH (ref 0–99)
NONHDL: 125.18
Total CHOL/HDL Ratio: 3
Triglycerides: 94 mg/dL (ref 0.0–149.0)
VLDL: 18.8 mg/dL (ref 0.0–40.0)

## 2018-01-09 LAB — PSA: PSA: 1.72 ng/mL (ref 0.10–4.00)

## 2018-01-09 LAB — BASIC METABOLIC PANEL
BUN: 17 mg/dL (ref 6–23)
CALCIUM: 10 mg/dL (ref 8.4–10.5)
CO2: 34 meq/L — AB (ref 19–32)
Chloride: 101 mEq/L (ref 96–112)
Creatinine, Ser: 1.3 mg/dL (ref 0.40–1.50)
GFR: 72.61 mL/min (ref >60.00)
Glucose, Bld: 115 mg/dL — ABNORMAL HIGH (ref 70–99)
Potassium: 4.4 mEq/L (ref 3.5–5.1)
SODIUM: 141 meq/L (ref 135–145)

## 2018-01-09 LAB — TESTOSTERONE: Testosterone: 467.37 ng/dL (ref 300.00–890.00)

## 2018-01-09 LAB — HEMOGLOBIN A1C: Hgb A1c MFr Bld: 6.7 % — ABNORMAL HIGH (ref 4.6–6.5)

## 2018-01-09 MED ORDER — AZELASTINE HCL 0.1 % NA SOLN: 2.0000 | Freq: Two times a day (BID) | NASAL | 11 refills | Status: DC

## 2018-01-09 NOTE — Patient Instructions (Signed)
Your ears were cleared of wax impactions by irrigation today  Please continue all other medications as before, including the astelin nasal spray  Please have the pharmacy call with any other refills you may need.  Please continue your efforts at being more active, low cholesterol diet, and weight control.  Please keep your appointments with your specialists as you may have planned  Please go to the LAB in the Basement (turn left off the elevator) for the tests to be done today  You will be contacted by phone if any changes need to be made immediately.  Otherwise, you will receive a letter about your results with an explanation, but please check with MyChart first.  Please remember to sign up for MyChart if you have not done so, as this will be important to you in the future with finding out test results, communicating by private email, and scheduling acute appointments online when needed.  Please return in 6 months, or sooner if needed

## 2018-01-09 NOTE — Progress Notes (Signed)
Subjective:    Patient ID: Miguel Powell, male    DOB: 09-Jul-1960, 57 y.o.   MRN: [id number]  HPI     Here to f/u with mutiple concerns, has bilat ear reduced hearing for 1 wk without ear pain or d/c or HA - ? Wax again;  Asks for testosterone with recent level 360, due to low stamina and weakness as suggested by his neurologist, was also suggested to be evaluated for myopathy but pt states he declined eval such as muscle biopsy.  He did not realize steroid are potentially related to myopathy, now on prednisone 10 mg daily.  Does have several wks ongoing nasal allergy symptoms with clearish congestion, itch and sneezing, without fever, pain, ST, cough, swelling or wheezing.  Denies urinary symptoms such as dysuria, frequency, urgency, flank pain, hematuria or n/v, fever, chills.   Pt denies polydipsia, polyuria.  Does also have a rather large tender nodule kind of thing come up in last 2 wks to left plantar foot 1st ray near MTP.  Pt denies chest pain, increased sob or doe, wheezing, orthopnea, PND, increased LE swelling, palpitations, dizziness or syncope. Past Medical History:  Diagnosis Date  . 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 Arthritis    neck  . Hypertension    states BP has been elevated recently; has been on med. since 1990s  . Myasthenia gravis ([place])   . Sinus drainage 02/03/2016  . Wears partial dentures    upper    Past Surgical History:  Procedure Laterality Date  . LASER ABLATION OF CONDYLOMAS  11/04/2009  . [place]  . MINOR FULGERATION OF ANAL CONDYLOMA N/A 02/10/2016   Procedure: EXCISION AND FULGURATION OF ANAL CONDYLOMA;  Surgeon: Jackolyn Confer, MD;  Location: [facility];  Service: General;  Laterality: N/A;    reports that he has never smoked. He has never used smokeless tobacco. He reports that he drinks alcohol. He reports that he does not use drugs. family history includes Aneurysm (age of onset: 9) in his brother; Cancer in his sister;  Diabetes in his mother and sister; Hypertension in his sister. Allergies  Allergen Reactions  . Azithromycin Other (See Comments)    DUE TO MYASTHENIA GRAVIS  . Botox [Botulinum Toxin Type A] Other (See Comments)    DUE TO MYASTHENIA GRAVIS  . Gentamycin [Gentamicin] Other (See Comments)    DUE TO MYASTHENIA GRAVIS  . Ketek [Telithromycin] Other (See Comments)    DUE TO MYASTHENIA GRAVIS  . Levaquin [Levofloxacin In D5w] Other (See Comments)    DUE TO MYASTHENIA GRAVIS  . Magnesium-Containing Compounds Other (See Comments)    DUE TO MYASTHENIA GRAVIS  . Neomycin Other (See Comments)    DUE TO MYASTHENIA GRAVIS  . Penicillins Other (See Comments)    DUE TO MYASTHENIA GRAVIS   . Procainamide Other (See Comments)    DUE TO MYASTHENIA GRAVIS  . Quinine Derivatives Other (See Comments)    DUE TO MYASTHENIA GRAVIS   Current Outpatient Medications on File Prior to Visit  Medication Sig Dispense Refill  . azaTHIOprine (IMURAN) 50 MG tablet Take 200 mg by mouth daily.     . calcium-vitamin D (OSCAL-500) 500-400 MG-UNIT tablet Take 1 tablet by mouth daily. 500-600    . cyclobenzaprine (FLEXERIL) 5 MG tablet Take 1 tablet (5 mg total) by mouth 3 (three) times daily as needed for muscle spasms. 60 tablet 1  . diphenhydrAMINE (BENADRYL) 25 MG tablet Take 25 mg by mouth  every 6 (six) hours as needed for sleep.     . fexofenadine (ALLEGRA) 180 MG tablet Take 1 tablet (180 mg total) by mouth daily. 30 tablet 2  . hydrochlorothiazide (MICROZIDE) 12.5 MG capsule Take 1 capsule (12.5 mg total) daily by mouth. 90 capsule 3  . Multiple Vitamin (MULTIVITAMIN) tablet Take 1 tablet by mouth daily.    Marland Kitchen olmesartan-hydrochlorothiazide (BENICAR HCT) 40-12.5 MG tablet TAKE 1 TABLET BY MOUTH ONCE DAILY 30 tablet 11  . predniSONE (DELTASONE) 2.5 MG tablet TAKE 5 TABLETS BY MOUTH EVERY OTHER DAY    . pyridostigmine (MESTINON) 60 MG tablet Take 60 mg by mouth 4 (four) times daily.     . ranitidine (ZANTAC) 150  MG tablet Take 1 tablet (150 mg total) by mouth 2 (two) times daily. 180 tablet 3  . simethicone (GAS-X) 80 MG chewable tablet Chew 1 tablet (80 mg total) by mouth every 6 (six) hours as needed for flatulence. 30 tablet 0  . escitalopram (LEXAPRO) 10 MG tablet Take 1 tablet (10 mg total) daily by mouth. 90 tablet 3   No current facility-administered medications on file prior to visit.    Review of Systems  Constitutional: Negative for other unusual diaphoresis or sweats HENT: Negative for ear discharge or swelling Eyes: Negative for other worsening visual disturbances Respiratory: Negative for stridor or other swelling  Gastrointestinal: Negative for worsening distension or other blood Genitourinary: Negative for retention or other urinary change Musculoskeletal: Negative for other MSK pain or swelling Skin: Negative for color change or other new lesions Neurological: Negative for worsening tremors and other numbness  Psychiatric/Behavioral: Negative for worsening agitation or other fatigue All other system neg per pt    Objective:   Physical Exam BP 124/86   Pulse 89   Temp 98.4 F (36.9 C) (Oral)   Ht 6' (1.829 m)   Wt 246 lb (111.6 kg)   SpO2 94%   BMI 33.36 kg/m  VS noted,  Constitutional: Pt appears in NAD HENT: Head: NCAT.  Right Ear: External ear normal.  Left Ear: External ear normal.  Bilat tm's with mild erythema.  Max sinus areas non tender.  Pharynx with mild erythema, no exudate Eyes: . Pupils are equal, round, and reactive to light. Conjunctivae and EOM are normal Nose: without d/c or deformity Neck: Neck supple. Gross normal ROM Cardiovascular: Normal rate and regular rhythm.   Pulmonary/Chest: Effort normal and breath sounds without rales or wheezing.  Abd:  Soft, NT, ND, + BS, no organomegaly Neurological: Pt is alert. At baseline orientation, motor grossly intact Skin: Skin is warm. No rashes, other new lesions, no LE edema Left plantar foot with 1/2 cm  area tender subq firm nodule like mass likely related to the tendon located near the MTP Psychiatric: Pt behavior is normal without agitation  No other exam findings Lab Results  Component Value Date   WBC 3.8 (L) 08/08/2017   HGB 14.7 08/08/2017   HCT 42.8 08/08/2017   PLT 247.0 08/08/2017   GLUCOSE 115 (H) 01/09/2018   CHOL 191 01/09/2018   TRIG 94.0 01/09/2018   HDL 65.40 01/09/2018   LDLCALC 106 (H) 01/09/2018   ALT 43 01/09/2018   AST 27 01/09/2018   NA 141 01/09/2018   K 4.4 01/09/2018   CL 101 01/09/2018   CREATININE 1.30 01/09/2018   BUN 17 01/09/2018   CO2 34 (H) 01/09/2018   TSH 0.57 03/15/2017   PSA 1.72 01/09/2018   HGBA1C 6.7 (H) 01/09/2018  MICROALBUR <0.7 08/08/2017        Assessment & Plan:

## 2018-01-10 NOTE — Assessment & Plan Note (Signed)
Now on 10 mg daily prednisone

## 2018-01-10 NOTE — Assessment & Plan Note (Signed)
stable overall by history and exam, recent data reviewed with pt, and pt to continue medical treatment as before,  to f/u any worsening symptoms or concerns, for a1c with labs 

## 2018-01-10 NOTE — Assessment & Plan Note (Addendum)
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

## 2018-01-10 NOTE — Assessment & Plan Note (Signed)
Improved s/p bilat ear irrigation today

## 2018-01-10 NOTE — Assessment & Plan Note (Signed)
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

## 2018-01-31 ENCOUNTER — Ambulatory Visit: Payer: Medicare HMO | Admitting: Internal Medicine

## 2018-02-04 DIAGNOSIS — J069 Acute upper respiratory infection, unspecified: Secondary | ICD-10-CM | POA: Diagnosis not present

## 2018-02-04 DIAGNOSIS — J9801 Acute bronchospasm: Secondary | ICD-10-CM | POA: Diagnosis not present

## 2018-02-15 DIAGNOSIS — H02402 Unspecified ptosis of left eyelid: Secondary | ICD-10-CM | POA: Diagnosis not present

## 2018-02-15 DIAGNOSIS — G7000 Myasthenia gravis without (acute) exacerbation: Secondary | ICD-10-CM | POA: Diagnosis not present

## 2018-02-15 DIAGNOSIS — H5022 Vertical strabismus, left eye: Secondary | ICD-10-CM | POA: Diagnosis not present

## 2018-02-20 DIAGNOSIS — Z79899 Other long term (current) drug therapy: Secondary | ICD-10-CM | POA: Diagnosis not present

## 2018-02-20 DIAGNOSIS — Z7952 Long term (current) use of systemic steroids: Secondary | ICD-10-CM | POA: Diagnosis not present

## 2018-02-20 DIAGNOSIS — G7000 Myasthenia gravis without (acute) exacerbation: Secondary | ICD-10-CM | POA: Diagnosis not present

## 2018-02-20 DIAGNOSIS — R531 Weakness: Secondary | ICD-10-CM | POA: Diagnosis not present

## 2018-02-20 DIAGNOSIS — G729 Myopathy, unspecified: Secondary | ICD-10-CM | POA: Diagnosis not present

## 2018-02-20 DIAGNOSIS — Z1159 Encounter for screening for other viral diseases: Secondary | ICD-10-CM | POA: Diagnosis not present

## 2018-02-20 DIAGNOSIS — R29898 Other symptoms and signs involving the musculoskeletal system: Secondary | ICD-10-CM | POA: Diagnosis not present

## 2018-02-20 DIAGNOSIS — G7289 Other specified myopathies: Secondary | ICD-10-CM | POA: Diagnosis not present

## 2018-02-26 DIAGNOSIS — R140 Abdominal distension (gaseous): Secondary | ICD-10-CM | POA: Diagnosis not present

## 2018-02-26 DIAGNOSIS — R1011 Right upper quadrant pain: Secondary | ICD-10-CM | POA: Diagnosis not present

## 2018-02-26 DIAGNOSIS — K219 Gastro-esophageal reflux disease without esophagitis: Secondary | ICD-10-CM | POA: Diagnosis not present

## 2018-02-26 DIAGNOSIS — K5900 Constipation, unspecified: Secondary | ICD-10-CM | POA: Diagnosis not present

## 2018-02-26 DIAGNOSIS — E669 Obesity, unspecified: Secondary | ICD-10-CM | POA: Diagnosis not present

## 2018-03-22 ENCOUNTER — Ambulatory Visit: Payer: Medicare HMO | Admitting: Internal Medicine

## 2018-03-22 DIAGNOSIS — Z0289 Encounter for other administrative examinations: Secondary | ICD-10-CM

## 2018-03-26 ENCOUNTER — Other Ambulatory Visit: Payer: Self-pay

## 2018-05-03 ENCOUNTER — Other Ambulatory Visit (INDEPENDENT_AMBULATORY_CARE_PROVIDER_SITE_OTHER): Payer: Medicare HMO

## 2018-05-03 ENCOUNTER — Other Ambulatory Visit: Payer: Self-pay

## 2018-05-03 ENCOUNTER — Ambulatory Visit (INDEPENDENT_AMBULATORY_CARE_PROVIDER_SITE_OTHER)
Admission: RE | Admit: 2018-05-03 | Discharge: 2018-05-03 | Disposition: A | Discharge status: Home / Self Care | Payer: Medicare HMO | Source: Ambulatory Visit | Attending: Internal Medicine | Admitting: Internal Medicine

## 2018-05-03 ENCOUNTER — Ambulatory Visit (INDEPENDENT_AMBULATORY_CARE_PROVIDER_SITE_OTHER): Payer: Medicare HMO | Admitting: Internal Medicine

## 2018-05-03 ENCOUNTER — Encounter: Payer: Self-pay | Admitting: Internal Medicine

## 2018-05-03 VITALS — BP 136/88 | HR 70 | Temp 98.5°F | Ht 72.0 in | Wt 249.0 lb

## 2018-05-03 DIAGNOSIS — H9191 Unspecified hearing loss, right ear: Secondary | ICD-10-CM | POA: Diagnosis not present

## 2018-05-03 DIAGNOSIS — M545 Low back pain, unspecified: Secondary | ICD-10-CM

## 2018-05-03 DIAGNOSIS — Z794 Long term (current) use of insulin: Secondary | ICD-10-CM

## 2018-05-03 DIAGNOSIS — E0822 Diabetes mellitus due to underlying condition with diabetic chronic kidney disease: Secondary | ICD-10-CM

## 2018-05-03 DIAGNOSIS — I10 Essential (primary) hypertension: Secondary | ICD-10-CM

## 2018-05-03 DIAGNOSIS — E785 Hyperlipidemia, unspecified: Secondary | ICD-10-CM | POA: Diagnosis not present

## 2018-05-03 HISTORY — DX: Hyperlipidemia, unspecified: E78.5

## 2018-05-03 LAB — BASIC METABOLIC PANEL
BUN: 19 mg/dL (ref 6–23)
CALCIUM: 9.8 mg/dL (ref 8.4–10.5)
CO2: 35 mEq/L — ABNORMAL HIGH (ref 19–32)
Chloride: 98 mEq/L (ref 96–112)
Creatinine, Ser: 1.37 mg/dL (ref 0.40–1.50)
GFR: 64.24 mL/min (ref >60.00)
Glucose, Bld: 89 mg/dL (ref 70–99)
Potassium: 3.6 mEq/L (ref 3.5–5.1)
SODIUM: 141 meq/L (ref 135–145)

## 2018-05-03 LAB — CBC WITH DIFFERENTIAL/PLATELET
BASOS PCT: 0.9 % (ref 0.0–3.0)
Basophils Absolute: 0.1 10*3/uL (ref 0.0–0.1)
EOS PCT: 1.2 % (ref 0.0–5.0)
Eosinophils Absolute: 0.1 10*3/uL (ref 0.0–0.7)
HCT: 43.2 % (ref 39.0–52.0)
HEMOGLOBIN: 14.9 g/dL (ref 13.0–17.0)
LYMPHS ABS: 2.8 10*3/uL (ref 0.7–4.0)
Lymphocytes Relative: 45.9 % (ref 12.0–46.0)
MCHC: 34.4 g/dL (ref 30.0–36.0)
MCV: 92.3 fl (ref 78.0–100.0)
MONOS PCT: 9.7 % (ref 3.0–12.0)
Monocytes Absolute: 0.6 10*3/uL (ref 0.1–1.0)
NEUTROS PCT: 42.3 % — AB (ref 43.0–77.0)
Neutro Abs: 2.6 10*3/uL (ref 1.4–7.7)
Platelets: 246 10*3/uL (ref 150.0–400.0)
RBC: 4.68 Mil/uL (ref 4.22–5.81)
RDW: 13.6 % (ref 11.5–15.5)
WBC: 6 10*3/uL (ref 4.0–10.5)

## 2018-05-03 LAB — LIPID PANEL
Cholesterol: 181 mg/dL (ref 0–200)
HDL: 54.3 mg/dL (ref >39.00)
LDL CALC: 102 mg/dL — AB (ref 0–99)
NonHDL: 126.24
Total CHOL/HDL Ratio: 3
Triglycerides: 120 mg/dL (ref 0.0–149.0)
VLDL: 24 mg/dL (ref 0.0–40.0)

## 2018-05-03 LAB — HEPATIC FUNCTION PANEL
ALBUMIN: 4.5 g/dL (ref 3.5–5.2)
ALT: 23 U/L (ref 0–53)
AST: 19 U/L (ref 0–37)
Alkaline Phosphatase: 37 U/L — ABNORMAL LOW (ref 39–117)
Bilirubin, Direct: 0.1 mg/dL (ref 0.0–0.3)
Total Bilirubin: 0.6 mg/dL (ref 0.2–1.2)
Total Protein: 7.3 g/dL (ref 6.0–8.3)

## 2018-05-03 LAB — HEMOGLOBIN A1C: Hgb A1c MFr Bld: 6.9 % — ABNORMAL HIGH (ref 4.6–6.5)

## 2018-05-03 MED ORDER — CYCLOBENZAPRINE HCL 5 MG PO TABS: 5.0000 mg | ORAL_TABLET | Freq: Three times a day (TID) | ORAL | 1 refills | Status: DC | PRN

## 2018-05-03 NOTE — Progress Notes (Signed)
Subjective:    Patient ID: Miguel Powell, male    DOB: Aug 05, 1960, 58 y.o.   MRN: [id number]  HPI  Here to f/u with c/o 2 wks onset mild to mod right lower back pain sharp and dull, intermittnet, worse to stand up, better to be still; has stumbled a few times but no falls. Pt denies bowel or bladder change, fever, wt loss,  worsening LE pain/numbness/weakness.  Pt denies chest pain, increased sob or doe, wheezing, orthopnea, PND, increased LE swelling, palpitations, dizziness or syncope.  Pt denies new neurological symptoms such as new headache, or facial or extremity weakness or numbness  .   Pt denies polydipsia, polyuria.  Also with right hearing loss for 1 wk, without fever, reduced hearing, HA, sinus congestion, ST or cough Past Medical History:  Diagnosis Date  . 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 Arthritis    neck  . HLD (hyperlipidemia) 05/03/2018  . Hypertension    states BP has been elevated recently; has been on med. since 1990s  . Myasthenia gravis ([place])   . Sinus drainage 02/03/2016  . Wears partial dentures    upper    Past Surgical History:  Procedure Laterality Date  . LASER ABLATION OF CONDYLOMAS  11/04/2009  . [place]  . MINOR FULGERATION OF ANAL CONDYLOMA N/A 02/10/2016   Procedure: EXCISION AND FULGURATION OF ANAL CONDYLOMA;  Surgeon: Jackolyn Confer, MD;  Location: [facility];  Service: General;  Laterality: N/A;    reports that he has never smoked. He has never used smokeless tobacco. He reports current alcohol use. He reports that he does not use drugs. family history includes Aneurysm (age of onset: 75) in his brother; Cancer in his sister; Diabetes in his mother and sister; Hypertension in his sister. Allergies  Allergen Reactions  . Azithromycin Other (See Comments)    DUE TO MYASTHENIA GRAVIS  . Botox [Botulinum Toxin Type A] Other (See Comments)    DUE TO MYASTHENIA GRAVIS  . Gentamycin [Gentamicin] Other (See Comments)    DUE  TO MYASTHENIA GRAVIS  . Ketek [Telithromycin] Other (See Comments)    DUE TO MYASTHENIA GRAVIS  . Levaquin [Levofloxacin In D5w] Other (See Comments)    DUE TO MYASTHENIA GRAVIS  . Magnesium-Containing Compounds Other (See Comments)    DUE TO MYASTHENIA GRAVIS  . Neomycin Other (See Comments)    DUE TO MYASTHENIA GRAVIS  . Penicillins Other (See Comments)    DUE TO MYASTHENIA GRAVIS   . Procainamide Other (See Comments)    DUE TO MYASTHENIA GRAVIS  . Quinine Derivatives Other (See Comments)    DUE TO MYASTHENIA GRAVIS   Current Outpatient Medications on File Prior to Visit  Medication Sig Dispense Refill  . azaTHIOprine (IMURAN) 50 MG tablet Take 200 mg by mouth daily.     Marland Kitchen azelastine (ASTELIN) 0.1 % nasal spray Place 2 sprays into both nostrils 2 (two) times daily. Use in each nostril as directed 30 mL 11  . calcium-vitamin D (OSCAL-500) 500-400 MG-UNIT tablet Take 1 tablet by mouth daily. 500-600    . diphenhydrAMINE (BENADRYL) 25 MG tablet Take 25 mg by mouth every 6 (six) hours as needed for sleep.     . fexofenadine (ALLEGRA) 180 MG tablet Take 1 tablet (180 mg total) by mouth daily. 30 tablet 2  . hydrochlorothiazide (MICROZIDE) 12.5 MG capsule Take 1 capsule (12.5 mg total) daily by mouth. 90 capsule 3  . Multiple Vitamin (MULTIVITAMIN) tablet Take 1  tablet by mouth daily.    Marland Kitchen olmesartan-hydrochlorothiazide (BENICAR HCT) 40-12.5 MG tablet TAKE 1 TABLET BY MOUTH ONCE DAILY 30 tablet 11  . predniSONE (DELTASONE) 2.5 MG tablet TAKE 5 TABLETS BY MOUTH EVERY OTHER DAY    . pyridostigmine (MESTINON) 60 MG tablet Take 60 mg by mouth 4 (four) times daily.     . ranitidine (ZANTAC) 150 MG tablet Take 1 tablet (150 mg total) by mouth 2 (two) times daily. 180 tablet 3  . simethicone (GAS-X) 80 MG chewable tablet Chew 1 tablet (80 mg total) by mouth every 6 (six) hours as needed for flatulence. 30 tablet 0  . escitalopram (LEXAPRO) 10 MG tablet Take 1 tablet (10 mg total) daily by mouth.  90 tablet 3   No current facility-administered medications on file prior to visit.    Review of Systems  Constitutional: Negative for other unusual diaphoresis or sweats HENT: Negative for ear discharge or swelling Eyes: Negative for other worsening visual disturbances Respiratory: Negative for stridor or other swelling  Gastrointestinal: Negative for worsening distension or other blood Genitourinary: Negative for retention or other urinary change Musculoskeletal: Negative for other MSK pain or swelling Skin: Negative for color change or other new lesions Neurological: Negative for worsening tremors and other numbness  Psychiatric/Behavioral: Negative for worsening agitation or other fatigue All other system neg per pt    Objective:   Physical Exam BP 136/88   Pulse 70   Temp 98.5 F (36.9 C) (Oral)   Ht 6' (1.829 m)   Wt 249 lb (112.9 kg)   SpO2 93%   BMI 33.77 kg/m  VS noted,  Constitutional: Pt appears in NAD HENT: Head: NCAT.  Right Ear: External ear normal. Right canal clear and normal appearance after irrigation of wax impaction and hearing improved Left Ear: External ear normal.  Eyes: . Pupils are equal, round, and reactive to light. Conjunctivae and EOM are normal Nose: without d/c or deformity Neck: Neck supple. Gross normal ROM Cardiovascular: Normal rate and regular rhythm.   Pulmonary/Chest: Effort normal and breath sounds without rales or wheezing.  Abd:  Soft, NT, ND, + BS, no organomegaly, no flank tender Spine nontender in midline, + right lumbar paravertebral tender Neurological: Pt is alert. At baseline orientation, motor grossly intact Skin: Skin is warm. No rashes, other new lesions, no LE edema Psychiatric: Pt behavior is normal without agitation  No other exam findings Lab Results  Component Value Date   WBC 6.0 05/03/2018   HGB 14.9 05/03/2018   HCT 43.2 05/03/2018   PLT 246.0 05/03/2018   GLUCOSE 89 05/03/2018   CHOL 181 05/03/2018   TRIG  120.0 05/03/2018   HDL 54.30 05/03/2018   LDLCALC 102 (H) 05/03/2018   ALT 23 05/03/2018   AST 19 05/03/2018   NA 141 05/03/2018   K 3.6 05/03/2018   CL 98 05/03/2018   CREATININE 1.37 05/03/2018   BUN 19 05/03/2018   CO2 35 (H) 05/03/2018   TSH 0.57 03/15/2017   PSA 1.72 01/09/2018   HGBA1C 6.9 (H) 05/03/2018   MICROALBUR <0.7 08/08/2017       Assessment & Plan:

## 2018-05-03 NOTE — Patient Instructions (Addendum)
Your right ear was irrigated of wax impaction today  Please continue all other medications as before, and refills have been done if requested - the flexeril  Please have the pharmacy call with any other refills you may need.  Please continue your efforts at being more active, low cholesterol diet, and weight control.  You are otherwise up to date with prevention measures today.  Please keep your appointments with your specialists as you may have planned  Please go to the XRAY Department in the Basement (go straight as you get off the elevator) for the x-ray testing  Please go to the LAB in the Basement (turn left off the elevator) for the tests to be done today  You will be contacted by phone if any changes need to be made immediately.  Otherwise, you will receive a letter about your results with an explanation, but please check with MyChart first.  Please remember to sign up for MyChart if you have not done so, as this will be important to you in the future with finding out test results, communicating by private email, and scheduling acute appointments online when needed.

## 2018-05-05 ENCOUNTER — Encounter: Payer: Self-pay | Admitting: Internal Medicine

## 2018-05-05 NOTE — Assessment & Plan Note (Signed)
C/w possible msk strain and/or underlying djd/ddd pain flare, for ls spine films,, labs as ordered,  to f/u any worsening symptoms or concerns

## 2018-05-05 NOTE — Assessment & Plan Note (Signed)
stable overall by history and exam, recent data reviewed with pt, and pt to continue medical treatment as before,  to f/u any worsening symptoms or concerns  

## 2018-05-05 NOTE — Assessment & Plan Note (Signed)
Improved after wax impaction irrigation,  to f/u any worsening symptoms or concerns 

## 2018-05-10 ENCOUNTER — Telehealth: Payer: Self-pay | Admitting: Internal Medicine

## 2018-05-10 NOTE — Telephone Encounter (Signed)
Copied from [place] [id number]. Topic: Quick Communication - See Telephone Encounter >> May 10, 2018  3:44 PM Rutherford Nail, NT wrote: CRM for notification. See Telephone encounter for: 05/10/18. Patient calling and would like a call regarding his x-ray results from 05/03/2018. Please advise.

## 2018-05-14 NOTE — Telephone Encounter (Signed)
Called pt, LVM.   

## 2018-05-15 DIAGNOSIS — N401 Enlarged prostate with lower urinary tract symptoms: Secondary | ICD-10-CM | POA: Diagnosis not present

## 2018-05-15 DIAGNOSIS — N5201 Erectile dysfunction due to arterial insufficiency: Secondary | ICD-10-CM | POA: Diagnosis not present

## 2018-05-15 DIAGNOSIS — R351 Nocturia: Secondary | ICD-10-CM | POA: Diagnosis not present

## 2018-06-06 ENCOUNTER — Telehealth: Payer: Self-pay | Admitting: Internal Medicine

## 2018-06-06 NOTE — Telephone Encounter (Signed)
Pt has a bump under the skin in the sternum area, states it causes him no discomfort but he would like to haver an xray and get dr. Judi Cong thoughts on this. He has no acces to virtual please call patient back

## 2018-06-06 NOTE — Telephone Encounter (Signed)
Called pt, LVM.   

## 2018-06-06 NOTE — Telephone Encounter (Signed)
Pt called back and asked to speak with Shirron about results.

## 2018-06-07 NOTE — Telephone Encounter (Signed)
I suspect he is referring to a subcutaneous nodule, which are of many causes, related to the skin, and are not significant and normally do not require further evaluation or treatment, unless it is seeming to the patient that it is enlarging.  Then a referral to a general surgeon for removal is usually the next step.  If the bump is tender or there is skin redness and pain and swelling and fever, this could be an abscess, and he should come to office to check this, or go to Emergency room or see a surgeon for drainage. Otherwise, he should know an xray would not be helpful since xrays only show bones and shadows of internal organs.  Hope this has been helpful

## 2018-06-07 NOTE — Telephone Encounter (Signed)
Called pt, LVM.   

## 2018-07-10 ENCOUNTER — Telehealth: Payer: Self-pay | Admitting: Internal Medicine

## 2018-07-10 NOTE — Telephone Encounter (Signed)
Pt can not do virtual visit, are these symptoms ok for an ov or should it be schedule as a phone visit?

## 2018-07-10 NOTE — Telephone Encounter (Signed)
Patient needs to reschedule 6 month fu.  Patient states he has a dry cough and that Dr. Jenny Reichmann informed him that it was post nasal drip.  Please follow up in regard to reschedule.

## 2018-07-10 NOTE — Telephone Encounter (Signed)
Ok for inperson OV 

## 2018-07-11 NOTE — Telephone Encounter (Signed)
Left vm for pt to call back to schedule

## 2018-07-12 ENCOUNTER — Ambulatory Visit: Payer: Medicare HMO | Admitting: Internal Medicine

## 2018-07-12 DIAGNOSIS — E0822 Diabetes mellitus due to underlying condition with diabetic chronic kidney disease: Secondary | ICD-10-CM | POA: Diagnosis not present

## 2018-07-12 DIAGNOSIS — G7000 Myasthenia gravis without (acute) exacerbation: Secondary | ICD-10-CM | POA: Diagnosis not present

## 2018-07-12 DIAGNOSIS — R29898 Other symptoms and signs involving the musculoskeletal system: Secondary | ICD-10-CM | POA: Diagnosis not present

## 2018-07-12 DIAGNOSIS — H532 Diplopia: Secondary | ICD-10-CM | POA: Diagnosis not present

## 2018-07-12 DIAGNOSIS — H02402 Unspecified ptosis of left eyelid: Secondary | ICD-10-CM | POA: Diagnosis not present

## 2018-07-12 DIAGNOSIS — N181 Chronic kidney disease, stage 1: Secondary | ICD-10-CM | POA: Diagnosis not present

## 2018-07-12 DIAGNOSIS — Z79899 Other long term (current) drug therapy: Secondary | ICD-10-CM | POA: Diagnosis not present

## 2018-08-05 DIAGNOSIS — H02402 Unspecified ptosis of left eyelid: Secondary | ICD-10-CM | POA: Diagnosis not present

## 2018-08-05 DIAGNOSIS — G7000 Myasthenia gravis without (acute) exacerbation: Secondary | ICD-10-CM | POA: Diagnosis not present

## 2018-08-05 DIAGNOSIS — H5022 Vertical strabismus, left eye: Secondary | ICD-10-CM | POA: Diagnosis not present

## 2018-08-12 ENCOUNTER — Ambulatory Visit (INDEPENDENT_AMBULATORY_CARE_PROVIDER_SITE_OTHER): Payer: Medicare HMO | Admitting: Internal Medicine

## 2018-08-12 ENCOUNTER — Encounter: Payer: Self-pay | Admitting: Internal Medicine

## 2018-08-12 ENCOUNTER — Other Ambulatory Visit: Payer: Self-pay

## 2018-08-12 VITALS — BP 134/86 | HR 70 | Temp 98.6°F | Resp 15 | Ht 72.0 in | Wt 247.0 lb

## 2018-08-12 DIAGNOSIS — Z23 Encounter for immunization: Secondary | ICD-10-CM | POA: Diagnosis not present

## 2018-08-12 DIAGNOSIS — E538 Deficiency of other specified B group vitamins: Secondary | ICD-10-CM

## 2018-08-12 DIAGNOSIS — E0822 Diabetes mellitus due to underlying condition with diabetic chronic kidney disease: Secondary | ICD-10-CM

## 2018-08-12 DIAGNOSIS — Z111 Encounter for screening for respiratory tuberculosis: Secondary | ICD-10-CM

## 2018-08-12 DIAGNOSIS — Z794 Long term (current) use of insulin: Secondary | ICD-10-CM | POA: Diagnosis not present

## 2018-08-12 DIAGNOSIS — E611 Iron deficiency: Secondary | ICD-10-CM | POA: Diagnosis not present

## 2018-08-12 DIAGNOSIS — Z0000 Encounter for general adult medical examination without abnormal findings: Secondary | ICD-10-CM

## 2018-08-12 DIAGNOSIS — E559 Vitamin D deficiency, unspecified: Secondary | ICD-10-CM

## 2018-08-12 NOTE — Patient Instructions (Addendum)
You had the PPD test placed today for TB screening  Please return in 2-3 days to have the test checked and the form signed  You had the Prevnar pneumonia shot today  Please continue all other medications as before, and refills have been done if requested.  Please have the pharmacy call with any other refills you may need.  Please continue your efforts at being more active, low cholesterol diet, and weight control.  You are otherwise up to date with prevention measures today.  Please keep your appointments with your specialists as you may have planned  Please return in 3 months, or sooner if needed, with labs done 3-5 days ahead

## 2018-08-12 NOTE — Progress Notes (Signed)
Subjective:    Patient ID: Miguel Powell, male    DOB: 10/22/1960, 57 y.o.   MRN: [id number]  HPI  Here for wellness and f/u;  Overall doing ok;  Pt denies Chest pain, worsening SOB, DOE, wheezing, orthopnea, PND, worsening LE edema, palpitations, dizziness or syncope.  Pt denies neurological change such as new headache, facial or extremity weakness.  Pt denies polydipsia, polyuria, or low sugar symptoms. Pt states overall good compliance with treatment and medications, good tolerability, and has been trying to follow appropriate diet.  Pt denies worsening depressive symptoms, suicidal ideation or panic. No fever, night sweats, wt loss, loss of appetite, or other constitutional symptoms.  Pt states good ability with ADL's, has low fall risk, home safety reviewed and adequate, no other significant changes in hearing or vision, and only occasionally active with exercise.  Due for prevnar.  No new complaints Past Medical History:  Diagnosis Date  . 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 Arthritis    neck  . HLD (hyperlipidemia) 05/03/2018  . Hypertension    states BP has been elevated recently; has been on med. since 1990s  . Myasthenia gravis ([place])   . Sinus drainage 02/03/2016  . Wears partial dentures    upper    Past Surgical History:  Procedure Laterality Date  . LASER ABLATION OF CONDYLOMAS  11/04/2009  . [place]  . MINOR FULGERATION OF ANAL CONDYLOMA N/A 02/10/2016   Procedure: EXCISION AND FULGURATION OF ANAL CONDYLOMA;  Surgeon: Jackolyn Confer, MD;  Location: [facility];  Service: General;  Laterality: N/A;    reports that he has never smoked. He has never used smokeless tobacco. He reports current alcohol use. He reports that he does not use drugs. family history includes Aneurysm (age of onset: 72) in his brother; Cancer in his sister; Diabetes in his mother and sister; Hypertension in his sister. Allergies  Allergen Reactions  . Azithromycin Other (See  Comments)    DUE TO MYASTHENIA GRAVIS  . Botox [Botulinum Toxin Type A] Other (See Comments)    DUE TO MYASTHENIA GRAVIS  . Gentamycin [Gentamicin] Other (See Comments)    DUE TO MYASTHENIA GRAVIS  . Ketek [Telithromycin] Other (See Comments)    DUE TO MYASTHENIA GRAVIS  . Levaquin [Levofloxacin In D5w] Other (See Comments)    DUE TO MYASTHENIA GRAVIS  . Magnesium-Containing Compounds Other (See Comments)    DUE TO MYASTHENIA GRAVIS  . Neomycin Other (See Comments)    DUE TO MYASTHENIA GRAVIS  . Penicillins Other (See Comments)    DUE TO MYASTHENIA GRAVIS   . Procainamide Other (See Comments)    DUE TO MYASTHENIA GRAVIS  . Quinine Derivatives Other (See Comments)    DUE TO MYASTHENIA GRAVIS   Current Outpatient Medications on File Prior to Visit  Medication Sig Dispense Refill  . azaTHIOprine (IMURAN) 50 MG tablet Take 200 mg by mouth daily.     Marland Kitchen azelastine (ASTELIN) 0.1 % nasal spray Place 2 sprays into both nostrils 2 (two) times daily. Use in each nostril as directed 30 mL 11  . calcium-vitamin D (OSCAL-500) 500-400 MG-UNIT tablet Take 1 tablet by mouth daily. 500-600    . cyclobenzaprine (FLEXERIL) 5 MG tablet Take 1 tablet (5 mg total) by mouth 3 (three) times daily as needed for muscle spasms. 60 tablet 1  . diphenhydrAMINE (BENADRYL) 25 MG tablet Take 25 mg by mouth every 6 (six) hours as needed for sleep.     Marland Kitchen  hydrochlorothiazide (MICROZIDE) 12.5 MG capsule Take 1 capsule (12.5 mg total) daily by mouth. 90 capsule 3  . Multiple Vitamin (MULTIVITAMIN) tablet Take 1 tablet by mouth daily.    Marland Kitchen olmesartan-hydrochlorothiazide (BENICAR HCT) 40-12.5 MG tablet TAKE 1 TABLET BY MOUTH ONCE DAILY 30 tablet 11  . predniSONE (DELTASONE) 2.5 MG tablet TAKE 5 TABLETS BY MOUTH EVERY OTHER DAY    . pyridostigmine (MESTINON) 60 MG tablet Take 60 mg by mouth 4 (four) times daily.     . ranitidine (ZANTAC) 150 MG tablet Take 1 tablet (150 mg total) by mouth 2 (two) times daily. 180 tablet 3   . simethicone (GAS-X) 80 MG chewable tablet Chew 1 tablet (80 mg total) by mouth every 6 (six) hours as needed for flatulence. 30 tablet 0  . escitalopram (LEXAPRO) 10 MG tablet Take 1 tablet (10 mg total) daily by mouth. 90 tablet 3  . fexofenadine (ALLEGRA) 180 MG tablet Take 1 tablet (180 mg total) by mouth daily. 30 tablet 2   No current facility-administered medications on file prior to visit.    Review of Systems Constitutional: Negative for other unusual diaphoresis, sweats, appetite or weight changes HENT: Negative for other worsening hearing loss, ear pain, facial swelling, mouth sores or neck stiffness.   Eyes: Negative for other worsening pain, redness or other visual disturbance.  Respiratory: Negative for other stridor or swelling Cardiovascular: Negative for other palpitations or other chest pain  Gastrointestinal: Negative for worsening diarrhea or loose stools, blood in stool, distention or other pain Genitourinary: Negative for hematuria, flank pain or other change in urine volume.  Musculoskeletal: Negative for myalgias or other joint swelling.  Skin: Negative for other color change, or other wound or worsening drainage.  Neurological: Negative for other syncope or numbness. Hematological: Negative for other adenopathy or swelling Psychiatric/Behavioral: Negative for hallucinations, other worsening agitation, SI, self-injury, or new decreased concentration All other system neg per pt    Objective:   Physical Exam BP 134/86   Pulse 70   Temp 98.6 F (37 C) (Oral)   Resp 15   Ht 6' (1.829 m)   Wt 247 lb (112 kg)   SpO2 98%   BMI 33.50 kg/m  VS noted,  Constitutional: Pt is oriented to person, place, and time. Appears well-developed and well-nourished, in no significant distress and comfortable Head: Normocephalic and atraumatic  Eyes: Conjunctivae and EOM are normal. Pupils are equal, round, and reactive to light Right Ear: External ear normal without discharge  Left Ear: External ear normal without discharge Nose: Nose without discharge or deformity Mouth/Throat: Oropharynx is without other ulcerations and moist  Neck: Normal range of motion. Neck supple. No JVD present. No tracheal deviation present or significant neck LA or mass Cardiovascular: Normal rate, regular rhythm, normal heart sounds and intact distal pulses.   Pulmonary/Chest: WOB normal and breath sounds without rales or wheezing  Abdominal: Soft. Bowel sounds are normal. NT. No HSM  Musculoskeletal: Normal range of motion. Exhibits no edema Lymphadenopathy: Has no other cervical adenopathy.  Neurological: Pt is alert and oriented to person, place, and time. Pt has normal reflexes.. Motor grossly intact, o/w not done in detail Skin: Skin is warm and dry. No rash noted or new ulcerations Psychiatric:  Has normal mood and affect. Behavior is normal without agitation No other exam findings Lab Results  Component Value Date   WBC 6.0 05/03/2018   HGB 14.9 05/03/2018   HCT 43.2 05/03/2018   PLT 246.0 05/03/2018  GLUCOSE 89 05/03/2018   CHOL 181 05/03/2018   TRIG 120.0 05/03/2018   HDL 54.30 05/03/2018   LDLCALC 102 (H) 05/03/2018   ALT 23 05/03/2018   AST 19 05/03/2018   NA 141 05/03/2018   K 3.6 05/03/2018   CL 98 05/03/2018   CREATININE 1.37 05/03/2018   BUN 19 05/03/2018   CO2 35 (H) 05/03/2018   TSH 0.57 03/15/2017   PSA 1.72 01/09/2018   HGBA1C 6.9 (H) 05/03/2018   MICROALBUR <0.7 08/08/2017       Assessment & Plan:

## 2018-08-13 ENCOUNTER — Encounter: Payer: Self-pay | Admitting: Internal Medicine

## 2018-08-13 NOTE — Assessment & Plan Note (Signed)
stable overall by history and exam, recent data reviewed with pt, and pt to continue medical treatment as before,  to f/u any worsening symptoms or concerns  

## 2018-08-13 NOTE — Assessment & Plan Note (Signed)

## 2018-08-15 ENCOUNTER — Telehealth: Payer: Self-pay | Admitting: Emergency Medicine

## 2018-08-15 ENCOUNTER — Telehealth: Payer: Self-pay

## 2018-08-15 LAB — TB SKIN TEST
Induration: 0 mm
TB Skin Test: NEGATIVE

## 2018-08-15 NOTE — Telephone Encounter (Signed)
Returned patients call about having his TB test read. Pt will need to be here on 08/15/2018 by 4:39pm to make the 72 hr cut off. If not he will have to wait 2 weeks in order to have the test redone. If calls back he can speak with Jonelle Sidle. Thanks.

## 2018-08-15 NOTE — Telephone Encounter (Signed)
Tb skin test negative

## 2018-08-22 ENCOUNTER — Other Ambulatory Visit: Payer: Self-pay | Admitting: Internal Medicine

## 2018-09-23 ENCOUNTER — Telehealth: Payer: Self-pay | Admitting: Internal Medicine

## 2018-09-23 DIAGNOSIS — J301 Allergic rhinitis due to pollen: Secondary | ICD-10-CM

## 2018-09-23 DIAGNOSIS — R05 Cough: Secondary | ICD-10-CM | POA: Diagnosis not present

## 2018-09-23 MED ORDER — AZELASTINE HCL 0.1 % NA SOLN: 2.0000 | Freq: Two times a day (BID) | NASAL | 11 refills | Status: DC

## 2018-09-23 NOTE — Telephone Encounter (Signed)
Copied from [place] [id number]. Topic: Quick Communication - Rx Refill/Question >> Sep 23, 2018 11:29 AM Leward Quan A wrote: Medication: azelastine (ASTELIN) 0.1 % nasal spray   Has the patient contacted their pharmacy? Yes.   (Agent: If no, request that the patient contact the pharmacy for the refill.) (Agent: If yes, when and what did the pharmacy advise?)  Preferred Pharmacy (with phone number or street name): [place], [place] [address] [phone] (Phone) [phone] (Fax)    Agent: Please be advised that RX refills may take up to 3 business days. We ask that you follow-up with your pharmacy.

## 2018-09-24 DIAGNOSIS — R197 Diarrhea, unspecified: Secondary | ICD-10-CM | POA: Diagnosis not present

## 2018-09-24 DIAGNOSIS — Z20828 Contact with and (suspected) exposure to other viral communicable diseases: Secondary | ICD-10-CM | POA: Diagnosis not present

## 2018-09-24 DIAGNOSIS — R05 Cough: Secondary | ICD-10-CM | POA: Diagnosis not present

## 2018-09-24 IMAGING — DX DG CHEST 2V
2 series · 2 of 2 positions shown · non-contrast
Comparison: 02/23/2015

CLINICAL DATA: Persistent cough

EXAM:
CHEST  2 VIEW

[chest pa]
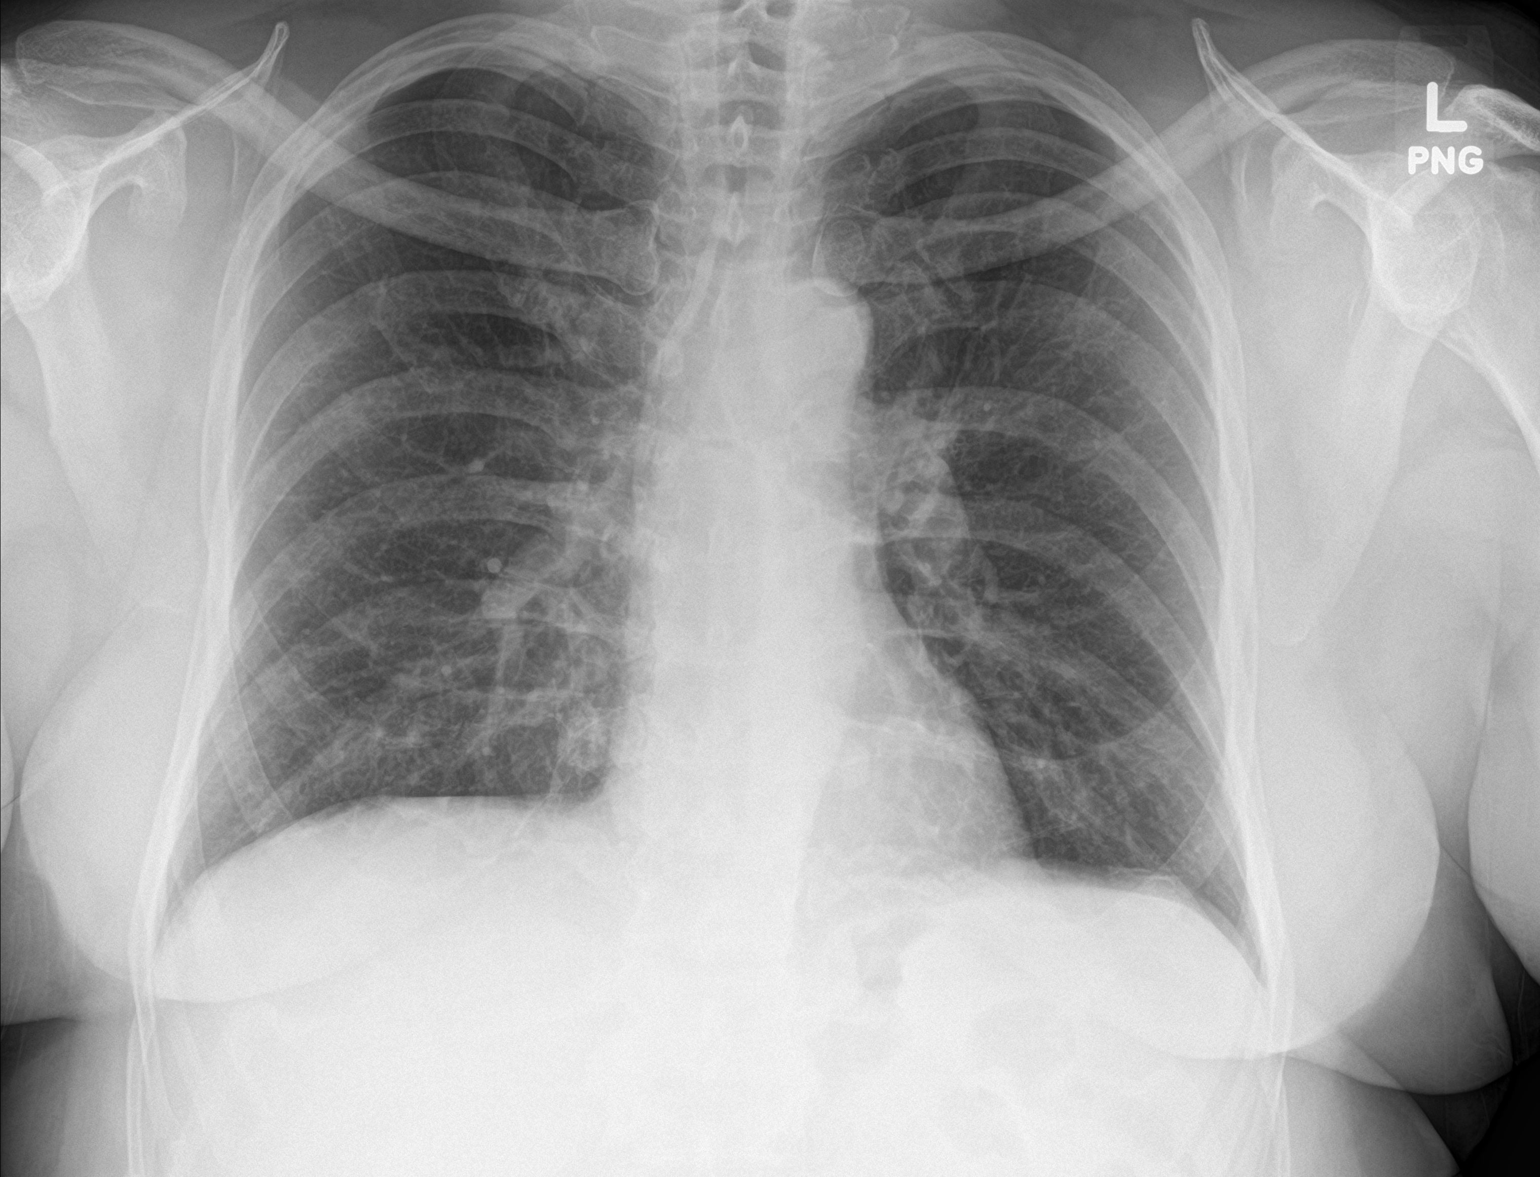

[chest lat]
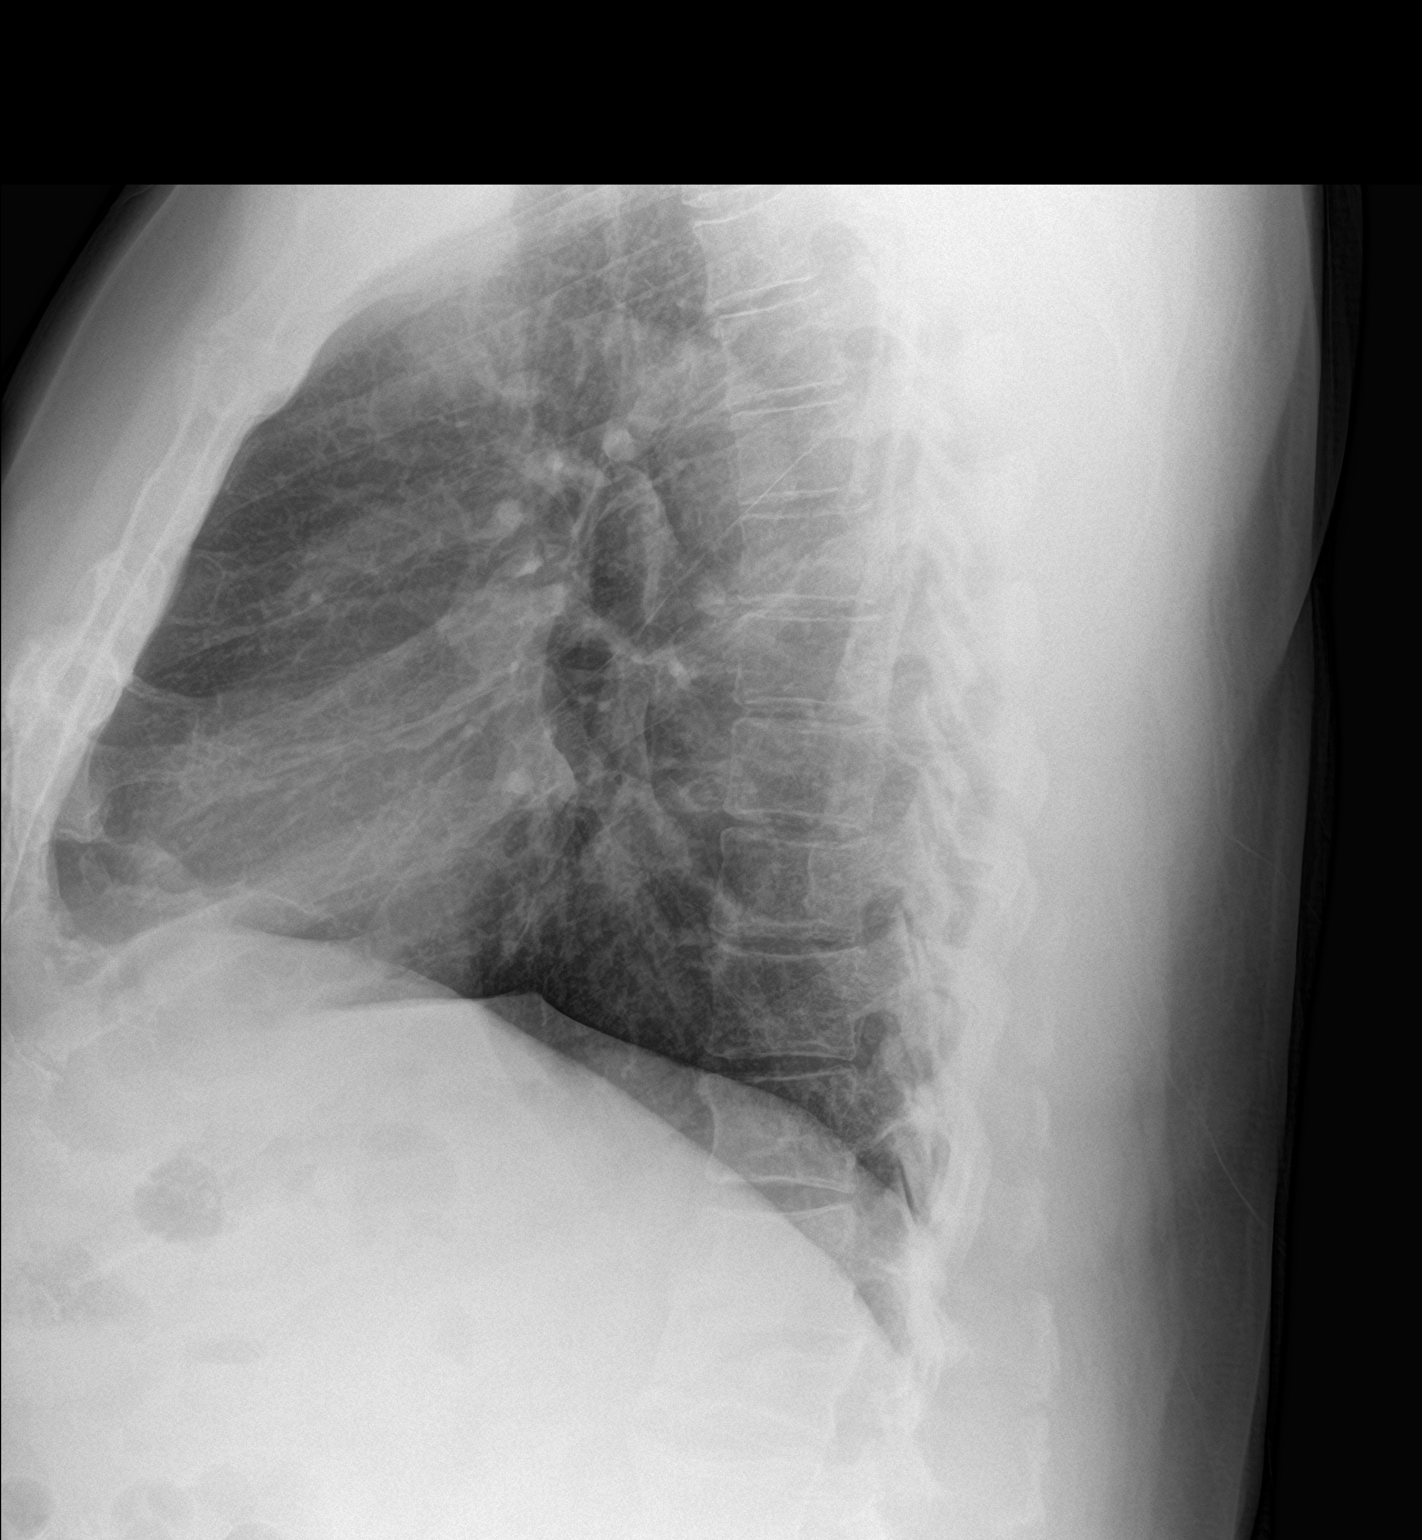

[2 of 2 positions shown; findings below may reference images not displayed]

FINDINGS: The heart size and mediastinal contours are within normal limits.
Both lungs are clear. The visualized skeletal structures are
unremarkable.
IMPRESSION: No active cardiopulmonary disease.

## 2018-10-07 DIAGNOSIS — R05 Cough: Secondary | ICD-10-CM | POA: Diagnosis not present

## 2018-10-07 DIAGNOSIS — R490 Dysphonia: Secondary | ICD-10-CM | POA: Diagnosis not present

## 2018-10-07 DIAGNOSIS — J310 Chronic rhinitis: Secondary | ICD-10-CM | POA: Diagnosis not present

## 2018-10-22 IMAGING — CR DG ABDOMEN 2V
3 series · 3 of 3 positions shown · non-contrast
Comparison: None.

CLINICAL DATA: Bloating and warmth to LUQ of abdomen since this
morning. Denies n/v/d.

EXAM:
ABDOMEN - 2 VIEW

[w abdomen upright]
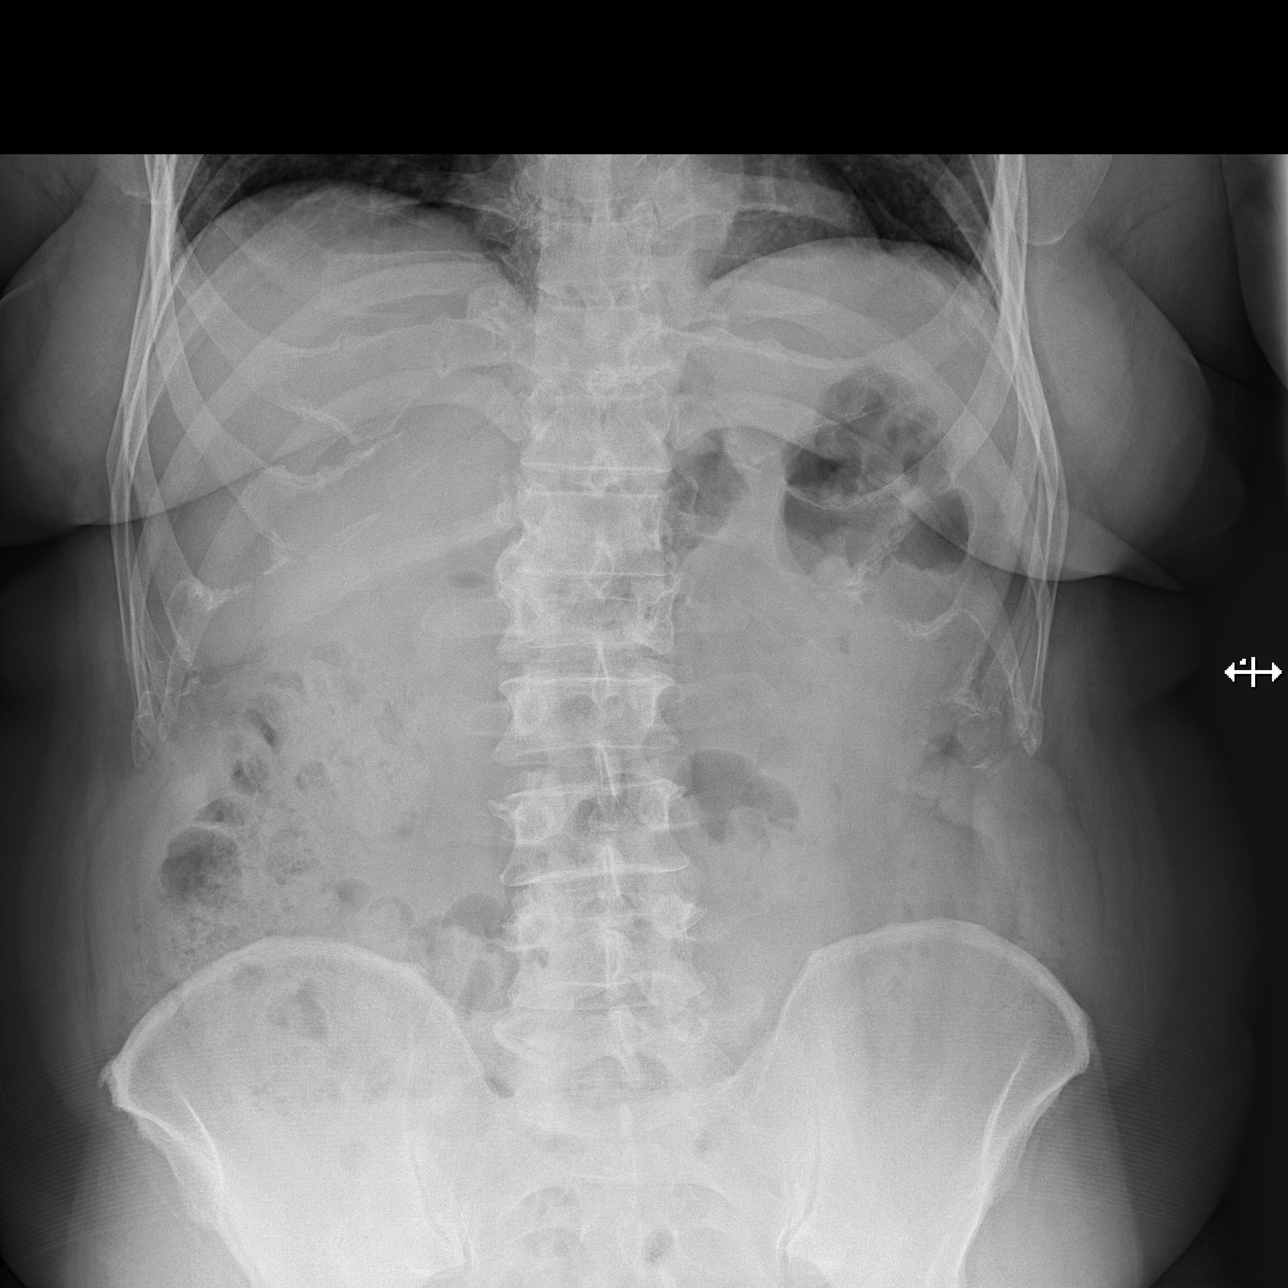

[t abdomen supine (1 of 2)]
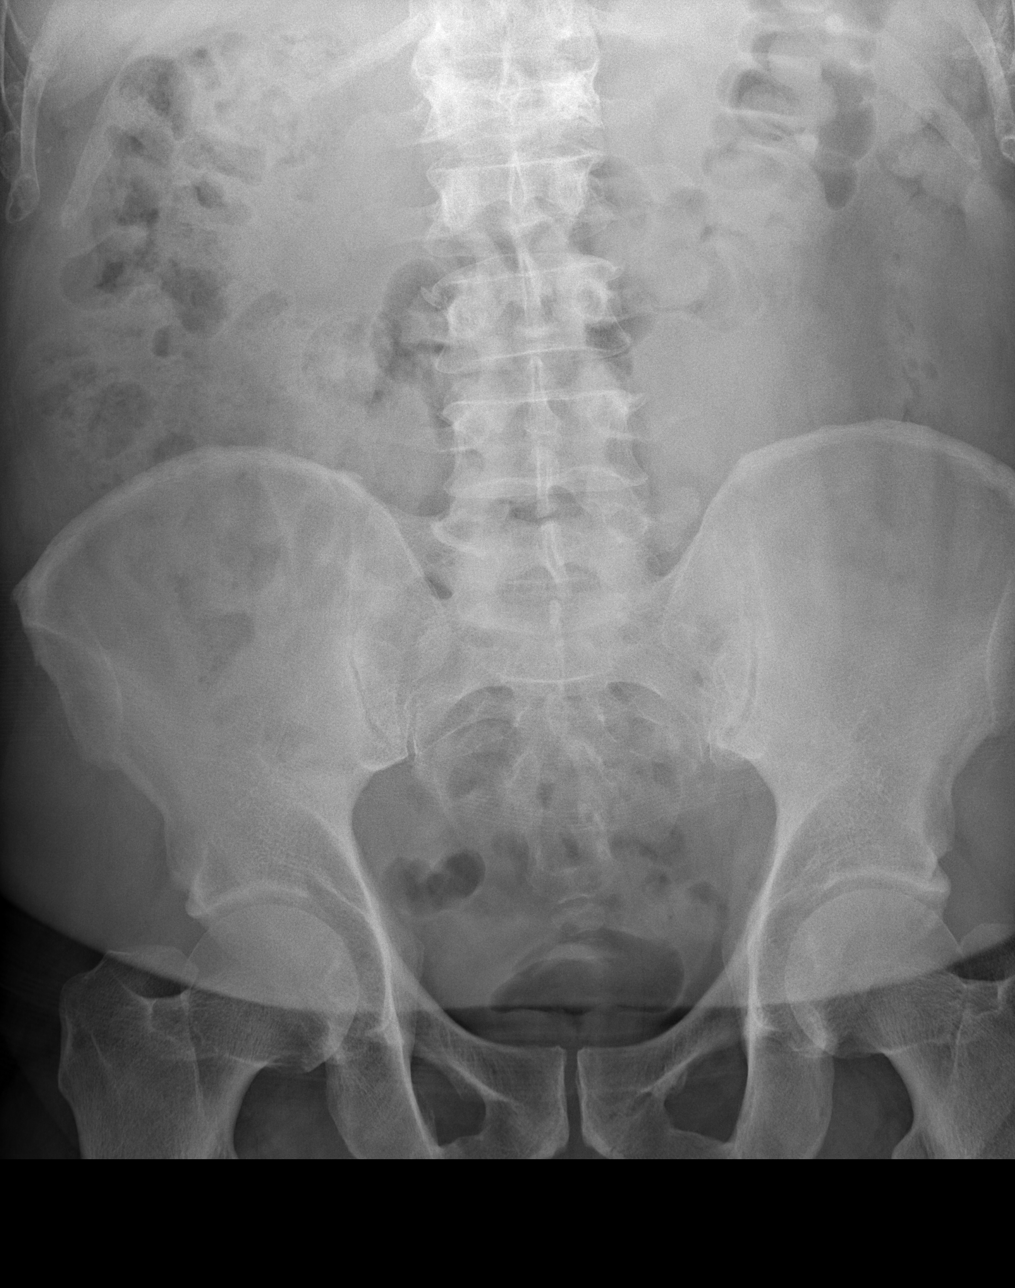

[t abdomen supine (2 of 2)]
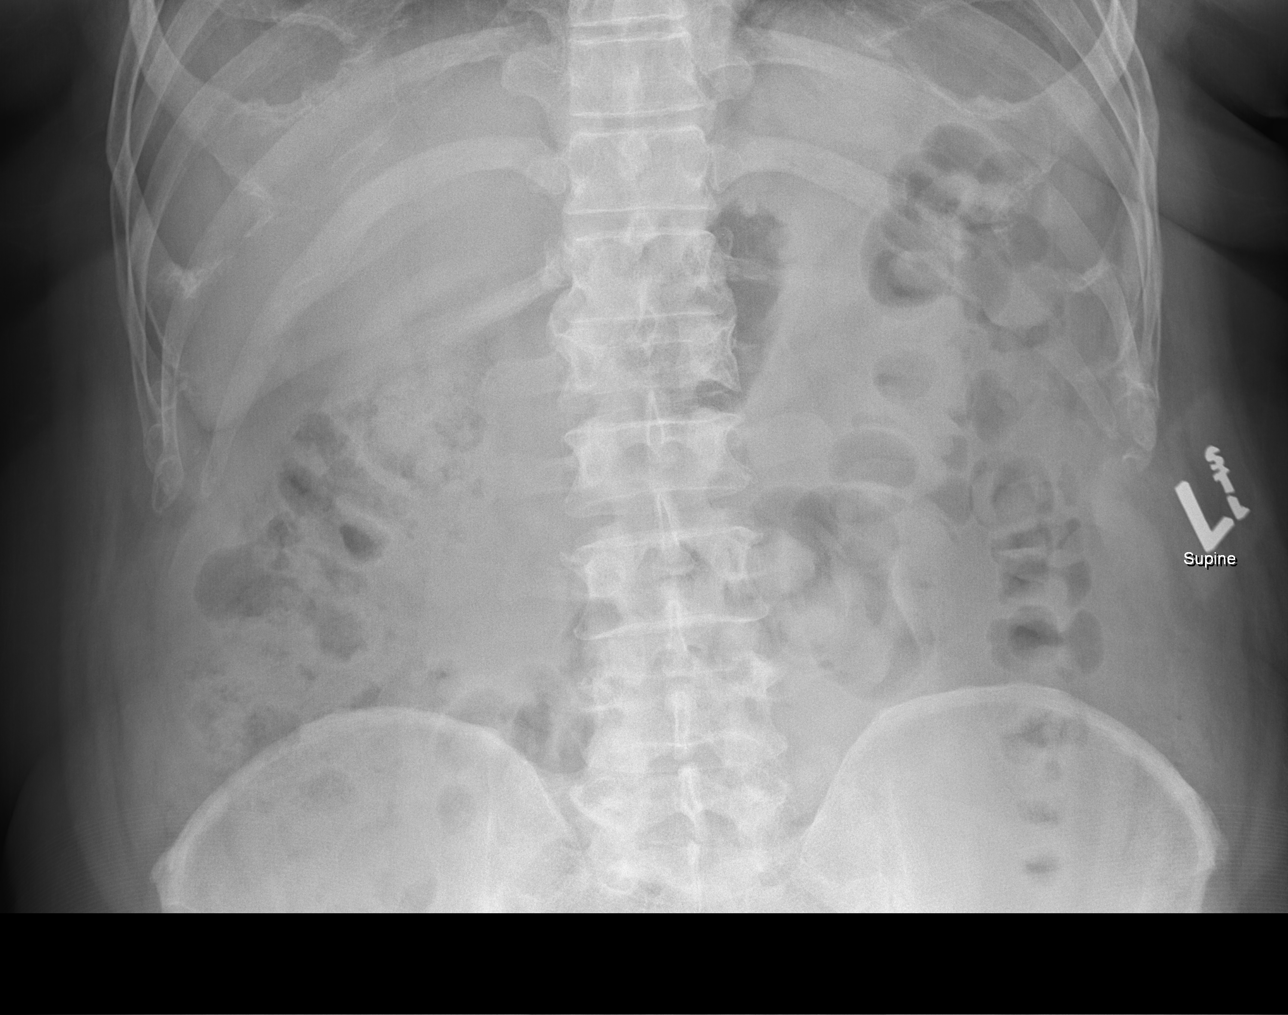

[3 of 3 positions shown; findings below may reference images not displayed]

FINDINGS: Moderate amount of stool in the ascending colon. There is no bowel
dilatation to suggest obstruction. There is no evidence of
pneumoperitoneum, portal venous gas or pneumatosis.

There are no pathologic calcifications along the expected course of
the ureters.

The osseous structures are unremarkable.
IMPRESSION: Negative.

## 2018-11-06 DIAGNOSIS — G7000 Myasthenia gravis without (acute) exacerbation: Secondary | ICD-10-CM | POA: Diagnosis not present

## 2018-11-06 DIAGNOSIS — Z79899 Other long term (current) drug therapy: Secondary | ICD-10-CM | POA: Diagnosis not present

## 2018-11-08 ENCOUNTER — Other Ambulatory Visit: Payer: Self-pay

## 2018-11-08 ENCOUNTER — Ambulatory Visit (INDEPENDENT_AMBULATORY_CARE_PROVIDER_SITE_OTHER): Payer: Medicare HMO | Admitting: Internal Medicine

## 2018-11-08 ENCOUNTER — Encounter: Payer: Self-pay | Admitting: Internal Medicine

## 2018-11-08 ENCOUNTER — Other Ambulatory Visit (INDEPENDENT_AMBULATORY_CARE_PROVIDER_SITE_OTHER): Payer: Medicare HMO

## 2018-11-08 DIAGNOSIS — E0822 Diabetes mellitus due to underlying condition with diabetic chronic kidney disease: Secondary | ICD-10-CM

## 2018-11-08 DIAGNOSIS — E611 Iron deficiency: Secondary | ICD-10-CM

## 2018-11-08 DIAGNOSIS — Z0000 Encounter for general adult medical examination without abnormal findings: Secondary | ICD-10-CM | POA: Diagnosis not present

## 2018-11-08 DIAGNOSIS — E785 Hyperlipidemia, unspecified: Secondary | ICD-10-CM | POA: Diagnosis not present

## 2018-11-08 DIAGNOSIS — E559 Vitamin D deficiency, unspecified: Secondary | ICD-10-CM

## 2018-11-08 DIAGNOSIS — I10 Essential (primary) hypertension: Secondary | ICD-10-CM | POA: Diagnosis not present

## 2018-11-08 DIAGNOSIS — R972 Elevated prostate specific antigen [PSA]: Secondary | ICD-10-CM | POA: Diagnosis not present

## 2018-11-08 DIAGNOSIS — Z794 Long term (current) use of insulin: Secondary | ICD-10-CM

## 2018-11-08 DIAGNOSIS — E538 Deficiency of other specified B group vitamins: Secondary | ICD-10-CM

## 2018-11-08 LAB — CBC WITH DIFFERENTIAL/PLATELET
Basophils Absolute: 0 10*3/uL (ref 0.0–0.1)
Basophils Relative: 0.7 % (ref 0.0–3.0)
Eosinophils Absolute: 0.1 10*3/uL (ref 0.0–0.7)
Eosinophils Relative: 1.5 % (ref 0.0–5.0)
HCT: 41.2 % (ref 39.0–52.0)
Hemoglobin: 14.2 g/dL (ref 13.0–17.0)
Lymphocytes Relative: 44.4 % (ref 12.0–46.0)
Lymphs Abs: 2.2 10*3/uL (ref 0.7–4.0)
MCHC: 34.4 g/dL (ref 30.0–36.0)
MCV: 92.1 fl (ref 78.0–100.0)
Monocytes Absolute: 0.7 10*3/uL (ref 0.1–1.0)
Monocytes Relative: 14.5 % — ABNORMAL HIGH (ref 3.0–12.0)
Neutro Abs: 1.9 10*3/uL (ref 1.4–7.7)
Neutrophils Relative %: 38.9 % — ABNORMAL LOW (ref 43.0–77.0)
Platelets: 240 10*3/uL (ref 150.0–400.0)
RBC: 4.47 Mil/uL (ref 4.22–5.81)
RDW: 14.2 % (ref 11.5–15.5)
WBC: 4.9 10*3/uL (ref 4.0–10.5)

## 2018-11-08 LAB — URINALYSIS, ROUTINE W REFLEX MICROSCOPIC
Bilirubin Urine: NEGATIVE
Ketones, ur: NEGATIVE
Leukocytes,Ua: NEGATIVE
Nitrite: NEGATIVE
Specific Gravity, Urine: <=1.005 — AB (ref 1.000–1.030)
Total Protein, Urine: NEGATIVE
Urine Glucose: NEGATIVE
Urobilinogen, UA: 0.2 (ref 0.0–1.0)
pH: 5.5 (ref 5.0–8.0)

## 2018-11-08 LAB — HEMOGLOBIN A1C: Hgb A1c MFr Bld: 6.8 % — ABNORMAL HIGH (ref 4.6–6.5)

## 2018-11-08 LAB — HEPATIC FUNCTION PANEL
ALT: 49 U/L (ref 0–53)
AST: 84 U/L — ABNORMAL HIGH (ref 0–37)
Albumin: 4.2 g/dL (ref 3.5–5.2)
Alkaline Phosphatase: 40 U/L (ref 39–117)
Bilirubin, Direct: 0.2 mg/dL (ref 0.0–0.3)
Total Bilirubin: 1 mg/dL (ref 0.2–1.2)
Total Protein: 6.4 g/dL (ref 6.0–8.3)

## 2018-11-08 LAB — BASIC METABOLIC PANEL
BUN: 13 mg/dL (ref 6–23)
CO2: 32 mEq/L (ref 19–32)
Calcium: 9.7 mg/dL (ref 8.4–10.5)
Chloride: 100 mEq/L (ref 96–112)
Creatinine, Ser: 1.31 mg/dL (ref 0.40–1.50)
GFR: 67.53 mL/min (ref >60.00)
Glucose, Bld: 90 mg/dL (ref 70–99)
Potassium: 3.7 mEq/L (ref 3.5–5.1)
Sodium: 141 mEq/L (ref 135–145)

## 2018-11-08 LAB — LIPID PANEL
Cholesterol: 158 mg/dL (ref 0–200)
HDL: 62 mg/dL (ref >39.00)
LDL Cholesterol: 79 mg/dL (ref 0–99)
NonHDL: 96.27
Total CHOL/HDL Ratio: 3
Triglycerides: 85 mg/dL (ref 0.0–149.0)
VLDL: 17 mg/dL (ref 0.0–40.0)

## 2018-11-08 LAB — MICROALBUMIN / CREATININE URINE RATIO
Creatinine,U: 51.3 mg/dL
Microalb Creat Ratio: 1.4 mg/g (ref 0.0–30.0)
Microalb, Ur: <0.7 mg/dL (ref 0.0–1.9)

## 2018-11-08 LAB — IBC PANEL
Iron: 150 ug/dL (ref 42–165)
Saturation Ratios: 46.6 % (ref 20.0–50.0)
Transferrin: 230 mg/dL (ref 212.0–360.0)

## 2018-11-08 LAB — VITAMIN D 25 HYDROXY (VIT D DEFICIENCY, FRACTURES): VITD: 62.8 ng/mL (ref 30.00–100.00)

## 2018-11-08 LAB — VITAMIN B12: Vitamin B-12: 373 pg/mL (ref 211–911)

## 2018-11-08 LAB — PSA: PSA: 2.27 ng/mL (ref 0.10–4.00)

## 2018-11-08 LAB — TSH: TSH: 0.53 u[IU]/mL (ref 0.35–4.50)

## 2018-11-08 MED ORDER — CYCLOBENZAPRINE HCL 5 MG PO TABS: 5.0000 mg | ORAL_TABLET | Freq: Three times a day (TID) | ORAL | 3 refills | Status: DC | PRN

## 2018-11-08 NOTE — Assessment & Plan Note (Signed)
stable overall by history and exam, recent data reviewed with pt, and pt to continue medical treatment as before,  to f/u any worsening symptoms or concerns  

## 2018-11-08 NOTE — Progress Notes (Signed)
Subjective:    Patient ID: Miguel Powell, male    DOB: 1960/04/25, 58 y.o.   MRN: [id number]  HPI  Here to f/u; overall doing ok,  Pt denies chest pain, increasing sob or doe, wheezing, orthopnea, PND, increased LE swelling, palpitations, dizziness or syncope.  Pt denies new neurological symptoms such as new headache, or facial or extremity weakness or numbness.  Pt denies polydipsia, polyuria, or low sugar episode.  Pt states overall good compliance with meds, mostly trying to follow appropriate diet, with wt overall down 7 lbs with less prednisone down from 10 to 7.5 mg Past Medical History:  Diagnosis Date  . 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 Arthritis    neck  . HLD (hyperlipidemia) 05/03/2018  . Hypertension    states BP has been elevated recently; has been on med. since 1990s  . Myasthenia gravis ([place])   . Sinus drainage 02/03/2016  . Wears partial dentures    upper    Past Surgical History:  Procedure Laterality Date  . LASER ABLATION OF CONDYLOMAS  11/04/2009  . [place]  . MINOR FULGERATION OF ANAL CONDYLOMA N/A 02/10/2016   Procedure: EXCISION AND FULGURATION OF ANAL CONDYLOMA;  Surgeon: Jackolyn Confer, MD;  Location: [facility];  Service: General;  Laterality: N/A;    reports that he has never smoked. He has never used smokeless tobacco. He reports current alcohol use. He reports that he does not use drugs. family history includes Aneurysm (age of onset: 69) in his brother; Cancer in his sister; Diabetes in his mother and sister; Hypertension in his sister. Allergies  Allergen Reactions  . Azithromycin Other (See Comments)    DUE TO MYASTHENIA GRAVIS  . Botox [Botulinum Toxin Type A] Other (See Comments)    DUE TO MYASTHENIA GRAVIS  . Gentamycin [Gentamicin] Other (See Comments)    DUE TO MYASTHENIA GRAVIS  . Ketek [Telithromycin] Other (See Comments)    DUE TO MYASTHENIA GRAVIS  . Levaquin [Levofloxacin In D5w] Other (See Comments)    DUE  TO MYASTHENIA GRAVIS  . Magnesium-Containing Compounds Other (See Comments)    DUE TO MYASTHENIA GRAVIS  . Neomycin Other (See Comments)    DUE TO MYASTHENIA GRAVIS  . Penicillins Other (See Comments)    DUE TO MYASTHENIA GRAVIS   . Procainamide Other (See Comments)    DUE TO MYASTHENIA GRAVIS  . Quinine Derivatives Other (See Comments)    DUE TO MYASTHENIA GRAVIS   Current Outpatient Medications on File Prior to Visit  Medication Sig Dispense Refill  . azaTHIOprine (IMURAN) 50 MG tablet Take 200 mg by mouth daily.     Marland Kitchen azelastine (ASTELIN) 0.1 % nasal spray Place 2 sprays into both nostrils 2 (two) times daily. Use in each nostril as directed 30 mL 11  . calcium-vitamin D (OSCAL-500) 500-400 MG-UNIT tablet Take 1 tablet by mouth daily. 500-600    . diphenhydrAMINE (BENADRYL) 25 MG tablet Take 25 mg by mouth every 6 (six) hours as needed for sleep.     . hydrochlorothiazide (MICROZIDE) 12.5 MG capsule Take 1 capsule (12.5 mg total) daily by mouth. 90 capsule 3  . Multiple Vitamin (MULTIVITAMIN) tablet Take 1 tablet by mouth daily.    Marland Kitchen olmesartan-hydrochlorothiazide (BENICAR HCT) 40-12.5 MG tablet Take 1 tablet by mouth once daily 90 tablet 0  . predniSONE (DELTASONE) 2.5 MG tablet TAKE 5 TABLETS BY MOUTH EVERY OTHER DAY    . pyridostigmine (MESTINON) 60 MG tablet Take 60  mg by mouth 4 (four) times daily.     . ranitidine (ZANTAC) 150 MG tablet Take 1 tablet (150 mg total) by mouth 2 (two) times daily. 180 tablet 3  . RESVERATROL PO Take 1 each by mouth daily.    . simethicone (GAS-X) 80 MG chewable tablet Chew 1 tablet (80 mg total) by mouth every 6 (six) hours as needed for flatulence. 30 tablet 0  . UNABLE TO FIND Take by mouth.    . escitalopram (LEXAPRO) 10 MG tablet Take 1 tablet (10 mg total) daily by mouth. 90 tablet 3  . fexofenadine (ALLEGRA) 180 MG tablet Take 1 tablet (180 mg total) by mouth daily. 30 tablet 2   No current facility-administered medications on file prior to  visit.    Review of Systems  Constitutional: Negative for other unusual diaphoresis or sweats HENT: Negative for ear discharge or swelling Eyes: Negative for other worsening visual disturbances Respiratory: Negative for stridor or other swelling  Gastrointestinal: Negative for worsening distension or other blood Genitourinary: Negative for retention or other urinary change Musculoskeletal: Negative for other MSK pain or swelling Skin: Negative for color change or other new lesions Neurological: Negative for worsening tremors and other numbness  Psychiatric/Behavioral: Negative for worsening agitation or other fatigue All otherwise neg per pt     Objective:   Physical Exam BP 140/88 (BP Location: Left Arm, Patient Position: Sitting, Cuff Size: Large)   Pulse 76   Temp 97.9 F (36.6 C) (Oral)   Ht 6' (1.829 m)   Wt 241 lb (109.3 kg)   SpO2 98%   BMI 32.69 kg/m  VS noted,  Constitutional: Pt appears in NAD HENT: Head: NCAT.  Right Ear: External ear normal.  Left Ear: External ear normal.  Eyes: . Pupils are equal, round, and reactive to light. Conjunctivae and EOM are normal Nose: without d/c or deformity Neck: Neck supple. Gross normal ROM Cardiovascular: Normal rate and regular rhythm.   Pulmonary/Chest: Effort normal and breath sounds without rales or wheezing.  Abd:  Soft, NT, ND, + BS, no organomegaly Neurological: Pt is alert. At baseline orientation, motor grossly intact Skin: Skin is warm. No rashes, other new lesions, no LE edema Psychiatric: Pt behavior is normal without agitation  All otherwise neg per pt  Lab Results  Component Value Date   WBC 6.0 05/03/2018   HGB 14.9 05/03/2018   HCT 43.2 05/03/2018   PLT 246.0 05/03/2018   GLUCOSE 89 05/03/2018   CHOL 181 05/03/2018   TRIG 120.0 05/03/2018   HDL 54.30 05/03/2018   LDLCALC 102 (H) 05/03/2018   ALT 23 05/03/2018   AST 19 05/03/2018   NA 141 05/03/2018   K 3.6 05/03/2018   CL 98 05/03/2018    CREATININE 1.37 05/03/2018   BUN 19 05/03/2018   CO2 35 (H) 05/03/2018   TSH 0.57 03/15/2017   PSA 1.72 01/09/2018   HGBA1C 6.9 (H) 05/03/2018   MICROALBUR <0.7 08/08/2017      Assessment & Plan:

## 2018-11-08 NOTE — Assessment & Plan Note (Signed)
Asympt, for f/u psa today 

## 2018-11-08 NOTE — Assessment & Plan Note (Signed)
stable overall by history and exam, recent data reviewed with pt, and pt to continue medical treatment as before,  to f/u any worsening symptoms or concerns, for f/u lab 

## 2018-11-08 NOTE — Patient Instructions (Addendum)

## 2018-11-13 ENCOUNTER — Telehealth: Payer: Self-pay

## 2018-11-13 NOTE — Telephone Encounter (Signed)
Pt would like to know why his AST on his 9/25 hepatic function panel is 84. He stated he seen his doctor at [facility] on 9/23 and at that time it was 5. He wanted to know what could cause that. Please advise.

## 2018-11-13 NOTE — Telephone Encounter (Signed)
Very difficult to sa, but this is very common, and is not felt to be significant for now.  It can be checked again at your next visit if you like. thanks

## 2018-11-14 NOTE — Telephone Encounter (Signed)
Called pt, LVM.   

## 2018-11-21 ENCOUNTER — Telehealth: Payer: Self-pay

## 2018-11-21 NOTE — Telephone Encounter (Signed)
Pt wanted to schedule an OV to discuss.  Copied from [place] [phone]. Topic: General - Other >> Nov 21, 2018 12:38 PM Rainey Pines A wrote: Patient would like a callback at [phone] to discuss his lab results and speak about what [facility] has stated about his lab results.

## 2018-11-22 NOTE — Telephone Encounter (Signed)
Pt returning call to [place].

## 2018-11-25 NOTE — Telephone Encounter (Signed)
Called pt, LVM.   

## 2018-11-30 ENCOUNTER — Other Ambulatory Visit: Payer: Self-pay | Admitting: Internal Medicine

## 2018-12-06 ENCOUNTER — Other Ambulatory Visit (INDEPENDENT_AMBULATORY_CARE_PROVIDER_SITE_OTHER): Payer: Medicare HMO

## 2018-12-06 ENCOUNTER — Other Ambulatory Visit: Payer: Self-pay

## 2018-12-06 ENCOUNTER — Encounter: Payer: Self-pay | Admitting: Internal Medicine

## 2018-12-06 ENCOUNTER — Ambulatory Visit (INDEPENDENT_AMBULATORY_CARE_PROVIDER_SITE_OTHER): Payer: Medicare HMO | Admitting: Internal Medicine

## 2018-12-06 ENCOUNTER — Ambulatory Visit: Payer: Medicare HMO | Admitting: Internal Medicine

## 2018-12-06 VITALS — BP 150/90 | HR 74 | Temp 97.8°F | Ht 72.0 in | Wt 236.0 lb

## 2018-12-06 DIAGNOSIS — R7989 Other specified abnormal findings of blood chemistry: Secondary | ICD-10-CM | POA: Insufficient documentation

## 2018-12-06 DIAGNOSIS — E119 Type 2 diabetes mellitus without complications: Secondary | ICD-10-CM

## 2018-12-06 DIAGNOSIS — Z794 Long term (current) use of insulin: Secondary | ICD-10-CM | POA: Diagnosis not present

## 2018-12-06 DIAGNOSIS — I10 Essential (primary) hypertension: Secondary | ICD-10-CM | POA: Diagnosis not present

## 2018-12-06 DIAGNOSIS — R945 Abnormal results of liver function studies: Secondary | ICD-10-CM | POA: Diagnosis not present

## 2018-12-06 DIAGNOSIS — E0822 Diabetes mellitus due to underlying condition with diabetic chronic kidney disease: Secondary | ICD-10-CM | POA: Diagnosis not present

## 2018-12-06 LAB — HEPATIC FUNCTION PANEL
ALT: 29 U/L (ref 0–53)
AST: 28 U/L (ref 0–37)
Albumin: 4.3 g/dL (ref 3.5–5.2)
Alkaline Phosphatase: 38 U/L — ABNORMAL LOW (ref 39–117)
Bilirubin, Direct: 0.1 mg/dL (ref 0.0–0.3)
Total Bilirubin: 0.6 mg/dL (ref 0.2–1.2)
Total Protein: 6.9 g/dL (ref 6.0–8.3)

## 2018-12-06 NOTE — Assessment & Plan Note (Signed)
Isolated abnormal, asympt, for f/u lfts, acute hep panel, and abd u/s for further evaluation

## 2018-12-06 NOTE — Patient Instructions (Signed)
We think your blood pressure is elevated most likely due to stress today  Please continue to monitor your blood pressures at home  Please continue all other medications as before, and refills have been done if requested.  Please have the pharmacy call with any other refills you may need.  Please continue your efforts at being more active, low cholesterol diet, and weight control  Please keep your appointments with your specialists as you may have planned  You will be contacted regarding the referral for: abdomen ultrasound to help check the liver  Please go to the LAB in the Basement (turn left off the elevator) for the tests to be done today  You will be contacted by phone if any changes need to be made immediately.  Otherwise, you will receive a letter about your results with an explanation, but please check with MyChart first.  Please remember to sign up for MyChart if you have not done so, as this will be important to you in the future with finding out test results, communicating by private email, and scheduling acute appointments online when needed.

## 2018-12-06 NOTE — Progress Notes (Signed)
Subjective:    Patient ID: Miguel Powell, male    DOB: [birth year], 58 y.o.   MRN: [id number]  HPI  Here to f/u recent elevated AST 84 as an isolated increased LFT noted by his [facility] provider and suggested he f/u here. Denies worsening reflux, abd pain, dysphagia, n/v, bowel change or blood.  No overt bleeding or bruising.  No hx of liver dz. No recent tylenol or ETOH use.  Lost 11 lbs with better diet.  BP ta home < 130/90 Wt Readings from Last 3 Encounters:  12/06/18 236 lb (107 kg)  11/08/18 241 lb (109.3 kg)  08/12/18 247 lb (112 kg)   BP Readings from Last 3 Encounters:  12/06/18 (!) 150/90  11/08/18 140/88  08/12/18 134/86   Past Medical History:  Diagnosis Date  . 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 Arthritis    neck  . HLD (hyperlipidemia) 05/03/2018  . Hypertension    states BP has been elevated recently; has been on med. since 1990s  . Myasthenia gravis ([place])   . Sinus drainage 02/03/2016  . Wears partial dentures    upper    Past Surgical History:  Procedure Laterality Date  . LASER ABLATION OF CONDYLOMAS  11/04/2009  . [place]  . MINOR FULGERATION OF ANAL CONDYLOMA N/A 02/10/2016   Procedure: EXCISION AND FULGURATION OF ANAL CONDYLOMA;  Surgeon: Jackolyn Confer, MD;  Location: [facility];  Service: General;  Laterality: N/A;    reports that he has never smoked. He has never used smokeless tobacco. He reports current alcohol use. He reports that he does not use drugs. family history includes Aneurysm (age of onset: 2) in his brother; Cancer in his sister; Diabetes in his mother and sister; Hypertension in his sister. Allergies  Allergen Reactions  . Azithromycin Other (See Comments)    DUE TO MYASTHENIA GRAVIS  . Botox [Botulinum Toxin Type A] Other (See Comments)    DUE TO MYASTHENIA GRAVIS  . Gentamycin [Gentamicin] Other (See Comments)    DUE TO MYASTHENIA GRAVIS  . Ketek [Telithromycin] Other (See Comments)    DUE TO MYASTHENIA  GRAVIS  . Levaquin [Levofloxacin In D5w] Other (See Comments)    DUE TO MYASTHENIA GRAVIS  . Magnesium-Containing Compounds Other (See Comments)    DUE TO MYASTHENIA GRAVIS  . Neomycin Other (See Comments)    DUE TO MYASTHENIA GRAVIS  . Penicillins Other (See Comments)    DUE TO MYASTHENIA GRAVIS   . Procainamide Other (See Comments)    DUE TO MYASTHENIA GRAVIS  . Quinine Derivatives Other (See Comments)    DUE TO MYASTHENIA GRAVIS   Current Outpatient Medications on File Prior to Visit  Medication Sig Dispense Refill  . azaTHIOprine (IMURAN) 50 MG tablet Take 200 mg by mouth daily.     Marland Kitchen azelastine (ASTELIN) 0.1 % nasal spray Place 2 sprays into both nostrils 2 (two) times daily. Use in each nostril as directed 30 mL 11  . calcium-vitamin D (OSCAL-500) 500-400 MG-UNIT tablet Take 1 tablet by mouth daily. 500-600    . cyclobenzaprine (FLEXERIL) 5 MG tablet Take 1 tablet (5 mg total) by mouth 3 (three) times daily as needed for muscle spasms. 60 tablet 3  . diphenhydrAMINE (BENADRYL) 25 MG tablet Take 25 mg by mouth every 6 (six) hours as needed for sleep.     . hydrochlorothiazide (MICROZIDE) 12.5 MG capsule Take 1 capsule (12.5 mg total) daily by mouth. 90 capsule 3  . Multiple  Vitamin (MULTIVITAMIN) tablet Take 1 tablet by mouth daily.    Marland Kitchen olmesartan-hydrochlorothiazide (BENICAR HCT) 40-12.5 MG tablet Take 1 tablet by mouth once daily 90 tablet 0  . predniSONE (DELTASONE) 2.5 MG tablet 3 tabs by mouth once daily    . pyridostigmine (MESTINON) 60 MG tablet Take 60 mg by mouth 4 (four) times daily.     . ranitidine (ZANTAC) 150 MG tablet Take 1 tablet (150 mg total) by mouth 2 (two) times daily. 180 tablet 3  . RESVERATROL PO Take 1 each by mouth daily.    . simethicone (GAS-X) 80 MG chewable tablet Chew 1 tablet (80 mg total) by mouth every 6 (six) hours as needed for flatulence. 30 tablet 0  . UNABLE TO FIND Take by mouth.    . escitalopram (LEXAPRO) 10 MG tablet Take 1 tablet (10  mg total) daily by mouth. 90 tablet 3  . fexofenadine (ALLEGRA) 180 MG tablet Take 1 tablet (180 mg total) by mouth daily. 30 tablet 2   No current facility-administered medications on file prior to visit.    Review of Systems  Constitutional: Negative for other unusual diaphoresis or sweats HENT: Negative for ear discharge or swelling Eyes: Negative for other worsening visual disturbances Respiratory: Negative for stridor or other swelling  Gastrointestinal: Negative for worsening distension or other blood Genitourinary: Negative for retention or other urinary change Musculoskeletal: Negative for other MSK pain or swelling Skin: Negative for color change or other new lesions Neurological: Negative for worsening tremors and other numbness  Psychiatric/Behavioral: Negative for worsening agitation or other fatigue All otherwise neg per pt     Objective:   Physical Exam BP (!) 150/90 (BP Location: Left Arm, Patient Position: Sitting, Cuff Size: Large)   Pulse 74   Temp 97.8 F (36.6 C) (Oral)   Ht 6' (1.829 m)   Wt 236 lb (107 kg)   SpO2 98%   BMI 32.01 kg/m  VS noted,  Constitutional: Pt appears in NAD HENT: Head: NCAT.  Right Ear: External ear normal.  Left Ear: External ear normal.  Eyes: . Pupils are equal, round, and reactive to light. Conjunctivae and EOM are normal Nose: without d/c or deformity Neck: Neck supple. Gross normal ROM Cardiovascular: Normal rate and regular rhythm.   Pulmonary/Chest: Effort normal and breath sounds without rales or wheezing.  Abd:  Soft, NT, ND, + BS, no organomegaly Neurological: Pt is alert. At baseline orientation, motor grossly intact, left eye ptosis improved, not wearing the eye patch today Skin: Skin is warm. No rashes, other new lesions, no LE edema Psychiatric: Pt behavior is normal without agitation  All otherwise neg per pt  Lab Results  Component Value Date   WBC 4.9 11/08/2018   HGB 14.2 11/08/2018   HCT 41.2 11/08/2018    PLT 240.0 11/08/2018   GLUCOSE 90 11/08/2018   CHOL 158 11/08/2018   TRIG 85.0 11/08/2018   HDL 62.00 11/08/2018   LDLCALC 79 11/08/2018   ALT 49 11/08/2018   AST 84 (H) 11/08/2018   NA 141 11/08/2018   K 3.7 11/08/2018   CL 100 11/08/2018   CREATININE 1.31 11/08/2018   BUN 13 11/08/2018   CO2 32 11/08/2018   TSH 0.53 11/08/2018   PSA 2.27 11/08/2018   HGBA1C 6.8 (H) 11/08/2018   MICROALBUR <0.7 11/08/2018      Assessment & Plan:

## 2018-12-06 NOTE — Addendum Note (Signed)
Addended by: Biagio Borg on: 12/06/2018 01:03 PM   Modules accepted: Orders

## 2018-12-06 NOTE — Assessment & Plan Note (Signed)
Lab Results  Component Value Date   HGBA1C 6.8 (H) 11/08/2018  stable overall by history and exam, recent data reviewed with pt, and pt to continue medical treatment as before,  to f/u any worsening symptoms or concerns

## 2018-12-06 NOTE — Assessment & Plan Note (Signed)
Mild elevated today, pt declines med change for now

## 2018-12-09 LAB — HEPATITIS PANEL, ACUTE
Hep A IgM: NONREACTIVE
Hep B C IgM: NONREACTIVE
Hepatitis B Surface Ag: NONREACTIVE
Hepatitis C Ab: NONREACTIVE
SIGNAL TO CUT-OFF: 0.01 (ref <1.00)

## 2018-12-11 ENCOUNTER — Ambulatory Visit: Payer: Medicare HMO | Admitting: Internal Medicine

## 2018-12-16 DIAGNOSIS — R490 Dysphonia: Secondary | ICD-10-CM | POA: Diagnosis not present

## 2018-12-16 DIAGNOSIS — R05 Cough: Secondary | ICD-10-CM | POA: Diagnosis not present

## 2018-12-18 ENCOUNTER — Ambulatory Visit
Admission: RE | Admit: 2018-12-18 | Discharge: 2018-12-18 | Disposition: A | Discharge status: Home / Self Care | Payer: Medicare HMO | Source: Ambulatory Visit | Attending: Internal Medicine | Admitting: Internal Medicine

## 2018-12-18 DIAGNOSIS — K760 Fatty (change of) liver, not elsewhere classified: Secondary | ICD-10-CM | POA: Diagnosis not present

## 2018-12-18 DIAGNOSIS — R945 Abnormal results of liver function studies: Secondary | ICD-10-CM

## 2018-12-18 DIAGNOSIS — R7989 Other specified abnormal findings of blood chemistry: Secondary | ICD-10-CM

## 2018-12-31 ENCOUNTER — Telehealth: Payer: Self-pay

## 2018-12-31 DIAGNOSIS — E119 Type 2 diabetes mellitus without complications: Secondary | ICD-10-CM

## 2018-12-31 NOTE — Telephone Encounter (Signed)
Called pt, LVM.    Copied from [place] [id number]. Topic: Quick Communication - See Telephone Encounter >> Dec 31, 2018  4:20 PM Loma [place] wrote: CRM for notification. See Telephone encounter for: 12/31/18.pls have nurse to reach out about his scan on 11/4. All he knows is that it ok. He does not want to wait till next year to discuss and request a FU at  [phone]

## 2019-01-02 NOTE — Telephone Encounter (Signed)
Patient is calling again to discuss his lab results.  Please call patient to discuss at [phone]

## 2019-01-03 NOTE — Telephone Encounter (Signed)
Routing to dr Jenny Reichmann, I have advised patient of note/instruction from dr john---dr Jenny Reichmann, can you please send information on how to decrease fatty liver with examples of what to eat or the regimen to follow to get this accomplished---patient will check mychart for that information, thanks

## 2019-01-03 NOTE — Telephone Encounter (Signed)
Called pt, LVM.   

## 2019-01-03 NOTE — Telephone Encounter (Signed)
A low fat diet and wt loss is the only thing; I can refer him to nutrition if he likes

## 2019-01-06 NOTE — Telephone Encounter (Signed)
lvm advising dr Judi Cong note/instructions, call back if you would like referral to nutritionist placed by dr Jenny Reichmann

## 2019-01-16 NOTE — Addendum Note (Signed)
Addended by: Biagio Borg on: 01/16/2019 04:55 PM   Modules accepted: Orders

## 2019-01-16 NOTE — Telephone Encounter (Signed)
Pt stated he would like to go ahead with referral to nutritionist as long as it is covered by Medicare. He would also like a call back from [employer].

## 2019-01-16 NOTE — Telephone Encounter (Signed)
Referral done

## 2019-01-24 DIAGNOSIS — G5603 Carpal tunnel syndrome, bilateral upper limbs: Secondary | ICD-10-CM | POA: Diagnosis not present

## 2019-01-24 DIAGNOSIS — Z79899 Other long term (current) drug therapy: Secondary | ICD-10-CM | POA: Diagnosis not present

## 2019-01-24 DIAGNOSIS — I129 Hypertensive chronic kidney disease with stage 1 through stage 4 chronic kidney disease, or unspecified chronic kidney disease: Secondary | ICD-10-CM | POA: Diagnosis not present

## 2019-01-24 DIAGNOSIS — R29898 Other symptoms and signs involving the musculoskeletal system: Secondary | ICD-10-CM | POA: Diagnosis not present

## 2019-01-24 DIAGNOSIS — H02402 Unspecified ptosis of left eyelid: Secondary | ICD-10-CM | POA: Diagnosis not present

## 2019-01-24 DIAGNOSIS — E782 Mixed hyperlipidemia: Secondary | ICD-10-CM | POA: Diagnosis not present

## 2019-01-24 DIAGNOSIS — H5021 Vertical strabismus, right eye: Secondary | ICD-10-CM | POA: Diagnosis not present

## 2019-01-24 DIAGNOSIS — G7289 Other specified myopathies: Secondary | ICD-10-CM | POA: Diagnosis not present

## 2019-01-24 DIAGNOSIS — E1122 Type 2 diabetes mellitus with diabetic chronic kidney disease: Secondary | ICD-10-CM | POA: Diagnosis not present

## 2019-01-24 DIAGNOSIS — G729 Myopathy, unspecified: Secondary | ICD-10-CM | POA: Diagnosis not present

## 2019-01-24 DIAGNOSIS — G7001 Myasthenia gravis with (acute) exacerbation: Secondary | ICD-10-CM | POA: Diagnosis not present

## 2019-01-24 DIAGNOSIS — G7000 Myasthenia gravis without (acute) exacerbation: Secondary | ICD-10-CM | POA: Diagnosis not present

## 2019-01-24 DIAGNOSIS — H5010 Unspecified exotropia: Secondary | ICD-10-CM | POA: Diagnosis not present

## 2019-01-24 DIAGNOSIS — E663 Overweight: Secondary | ICD-10-CM | POA: Diagnosis not present

## 2019-03-05 ENCOUNTER — Other Ambulatory Visit: Payer: Self-pay | Admitting: Internal Medicine

## 2019-03-05 NOTE — Telephone Encounter (Signed)
Please refill as per office routine med refill policy (all routine meds refilled for 3 mo or monthly per pt preference up to one year from last visit, then month to month grace period for 3 mo, then further med refills will have to be denied)  

## 2019-03-19 ENCOUNTER — Ambulatory Visit: Payer: Medicare HMO | Admitting: Registered"

## 2019-04-04 ENCOUNTER — Ambulatory Visit: Payer: Medicare HMO | Admitting: Registered"

## 2019-05-02 ENCOUNTER — Ambulatory Visit: Payer: Medicare HMO | Admitting: Registered"

## 2019-05-08 ENCOUNTER — Other Ambulatory Visit: Payer: Self-pay

## 2019-05-08 ENCOUNTER — Encounter: Payer: Self-pay | Admitting: Internal Medicine

## 2019-05-08 ENCOUNTER — Ambulatory Visit (INDEPENDENT_AMBULATORY_CARE_PROVIDER_SITE_OTHER): Payer: Medicare HMO | Admitting: Internal Medicine

## 2019-05-08 VITALS — BP 128/80 | HR 82 | Temp 98.5°F | Ht 72.0 in | Wt 238.4 lb

## 2019-05-08 DIAGNOSIS — E785 Hyperlipidemia, unspecified: Secondary | ICD-10-CM

## 2019-05-08 DIAGNOSIS — Z0001 Encounter for general adult medical examination with abnormal findings: Secondary | ICD-10-CM

## 2019-05-08 DIAGNOSIS — E0822 Diabetes mellitus due to underlying condition with diabetic chronic kidney disease: Secondary | ICD-10-CM | POA: Diagnosis not present

## 2019-05-08 DIAGNOSIS — I10 Essential (primary) hypertension: Secondary | ICD-10-CM

## 2019-05-08 DIAGNOSIS — Z794 Long term (current) use of insulin: Secondary | ICD-10-CM | POA: Diagnosis not present

## 2019-05-08 DIAGNOSIS — Z0000 Encounter for general adult medical examination without abnormal findings: Secondary | ICD-10-CM

## 2019-05-08 DIAGNOSIS — H9191 Unspecified hearing loss, right ear: Secondary | ICD-10-CM

## 2019-05-08 DIAGNOSIS — N182 Chronic kidney disease, stage 2 (mild): Secondary | ICD-10-CM | POA: Diagnosis not present

## 2019-05-08 NOTE — Assessment & Plan Note (Signed)

## 2019-05-08 NOTE — Assessment & Plan Note (Signed)
stable overall by history and exam, recent data reviewed with pt, and pt to continue medical treatment as before,  to f/u any worsening symptoms or concerns  

## 2019-05-08 NOTE — Progress Notes (Signed)
Subjective:    Patient ID: Miguel Powell, male    DOB: 06-02-1960, 59 y.o.   MRN: [id number]  HPI  Here for wellness and f/u;  Overall doing ok;  Pt denies Chest pain, worsening SOB, DOE, wheezing, orthopnea, PND, worsening LE edema, palpitations, dizziness or syncope.  Pt denies neurological change such as new headache, facial or extremity weakness.  Pt denies polydipsia, polyuria, or low sugar symptoms. Pt states overall good compliance with treatment and medications, good tolerability, and has been trying to follow appropriate diet.  Pt denies worsening depressive symptoms, suicidal ideation or panic. No fever, night sweats, wt loss, loss of appetite, or other constitutional symptoms.  Pt states good ability with ADL's, has low fall risk, home safety reviewed and adequate, no other significant changes in hearing or vision, and only occasionally active with exercise.  Also has right hearing loss sudden in the past wk, unable to keep wax irrigated at home without fever, d/c.   Past Medical History:  Diagnosis Date  . 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 Arthritis    neck  . HLD (hyperlipidemia) 05/03/2018  . Hypertension    states BP has been elevated recently; has been on med. since 1990s  . Myasthenia gravis ([place])   . Sinus drainage 02/03/2016  . Wears partial dentures    upper    Past Surgical History:  Procedure Laterality Date  . LASER ABLATION OF CONDYLOMAS  11/04/2009  . [place]  . MINOR FULGERATION OF ANAL CONDYLOMA N/A 02/10/2016   Procedure: EXCISION AND FULGURATION OF ANAL CONDYLOMA;  Surgeon: Jackolyn Confer, MD;  Location: [place];  Service: General;  Laterality: N/A;    reports that he has never smoked. He has never used smokeless tobacco. He reports current alcohol use. He reports that he does not use drugs. family history includes Aneurysm (age of onset: 93) in his brother; Cancer in his sister; Diabetes in his mother and sister; Hypertension in  his sister. Allergies  Allergen Reactions  . Azithromycin Other (See Comments)    DUE TO MYASTHENIA GRAVIS  . Botox [Botulinum Toxin Type A] Other (See Comments)    DUE TO MYASTHENIA GRAVIS  . Gentamycin [Gentamicin] Other (See Comments)    DUE TO MYASTHENIA GRAVIS  . Ketek [Telithromycin] Other (See Comments)    DUE TO MYASTHENIA GRAVIS  . Levaquin [Levofloxacin In D5w] Other (See Comments)    DUE TO MYASTHENIA GRAVIS  . Magnesium-Containing Compounds Other (See Comments)    DUE TO MYASTHENIA GRAVIS  . Neomycin Other (See Comments)    DUE TO MYASTHENIA GRAVIS  . Penicillins Other (See Comments)    DUE TO MYASTHENIA GRAVIS   . Procainamide Other (See Comments)    DUE TO MYASTHENIA GRAVIS  . Quinine Derivatives Other (See Comments)    DUE TO MYASTHENIA GRAVIS   Current Outpatient Medications on File Prior to Visit  Medication Sig Dispense Refill  . azaTHIOprine (IMURAN) 50 MG tablet Take 200 mg by mouth daily.     Marland Kitchen azelastine (ASTELIN) 0.1 % nasal spray Place 2 sprays into both nostrils 2 (two) times daily. Use in each nostril as directed 30 mL 11  . calcium-vitamin D (OSCAL-500) 500-400 MG-UNIT tablet Take 1 tablet by mouth daily. 500-600    . cyclobenzaprine (FLEXERIL) 5 MG tablet Take 1 tablet (5 mg total) by mouth 3 (three) times daily as needed for muscle spasms. 60 tablet 3  . diphenhydrAMINE (BENADRYL) 25 MG tablet Take 25  mg by mouth every 6 (six) hours as needed for sleep.     . Multiple Vitamin (MULTIVITAMIN) tablet Take 1 tablet by mouth daily.    Marland Kitchen olmesartan-hydrochlorothiazide (BENICAR HCT) 40-12.5 MG tablet Take 1 tablet by mouth once daily 90 tablet 1  . pyridostigmine (MESTINON) 60 MG tablet Take 60 mg by mouth 4 (four) times daily.     . ranitidine (ZANTAC) 150 MG tablet Take 1 tablet (150 mg total) by mouth 2 (two) times daily. 180 tablet 3  . RESVERATROL PO Take 1 each by mouth daily.    . simethicone (GAS-X) 80 MG chewable tablet Chew 1 tablet (80 mg total)  by mouth every 6 (six) hours as needed for flatulence. 30 tablet 0  . UNABLE TO FIND Take by mouth.    . escitalopram (LEXAPRO) 10 MG tablet Take 1 tablet (10 mg total) daily by mouth. 90 tablet 3  . fexofenadine (ALLEGRA) 180 MG tablet Take 1 tablet (180 mg total) by mouth daily. 30 tablet 2   No current facility-administered medications on file prior to visit.   Review of Systems All otherwise neg per pt     Objective:   Physical Exam BP 128/80   Pulse 82   Temp 98.5 F (36.9 C)   Ht 6' (1.829 m)   Wt 238 lb 6.4 oz (108.1 kg)   SpO2 99%   BMI 32.33 kg/m  VS noted,  Constitutional: Pt appears in NAD HENT: Head: NCAT.  Right Ear: External ear normal. Right canal clear after irrigation impaction Left Ear: External ear normal.  Eyes: . Pupils are equal, round, and reactive to light. Conjunctivae and EOM are normal Nose: without d/c or deformity Neck: Neck supple. Gross normal ROM Cardiovascular: Normal rate and regular rhythm.   Pulmonary/Chest: Effort normal and breath sounds without rales or wheezing.  Abd:  Soft, NT, ND, + BS, no organomegaly Neurological: Pt is alert. At baseline orientation, motor grossly intact Skin: Skin is warm. No rashes, other new lesions, no LE edema Psychiatric: Pt behavior is normal without agitation  All otherwise neg per pt Lab Results  Component Value Date   WBC 4.9 11/08/2018   HGB 14.2 11/08/2018   HCT 41.2 11/08/2018   PLT 240.0 11/08/2018   GLUCOSE 90 11/08/2018   CHOL 158 11/08/2018   TRIG 85.0 11/08/2018   HDL 62.00 11/08/2018   LDLCALC 79 11/08/2018   ALT 29 12/06/2018   AST 28 12/06/2018   NA 141 11/08/2018   K 3.7 11/08/2018   CL 100 11/08/2018   CREATININE 1.31 11/08/2018   BUN 13 11/08/2018   CO2 32 11/08/2018   TSH 0.53 11/08/2018   PSA 2.27 11/08/2018   HGBA1C 6.8 (H) 11/08/2018   MICROALBUR <0.7 11/08/2018       Assessment & Plan:

## 2019-05-08 NOTE — Patient Instructions (Addendum)
Your Covid vaccination is scheduled for Mar 26 at 11.15 AM in [place]  The website is:  [email or web address]  Your right ear was irrigated of wax today  Please continue all other medications as before, and refills have been done if requested.  Please have the pharmacy call with any other refills you may need.  Please continue your efforts at being more active, low cholesterol diet, and weight control.  You are otherwise up to date with prevention measures today.  Please keep your appointments with your specialists as you may have planned  Please go to the LAB at the blood drawing area for the tests to be done  You will be contacted by phone if any changes need to be made immediately.  Otherwise, you will receive a letter about your results with an explanation, but please check with MyChart first.  Please remember to sign up for MyChart if you have not done so, as this will be important to you in the future with finding out test results, communicating by private email, and scheduling acute appointments online when needed.  Please make an Appointment to return in 6 months, or sooner if needed

## 2019-05-08 NOTE — Assessment & Plan Note (Addendum)
Improved s/p irrigation  I spent 31 minutes in preparing to see the patient by review of recent labs, imaging and procedures, obtaining and reviewing separately obtained history, communicating with the patient and family or caregiver, ordering medications, tests or procedures, and documenting clinical information in the EHR including the differential Dx, treatment, and any further evaluation and other management of right hearing los, CKD< DM, HTN, HLD

## 2019-05-09 ENCOUNTER — Ambulatory Visit: Payer: Medicare HMO

## 2019-05-09 LAB — CBC WITH DIFFERENTIAL/PLATELET
Basophils Absolute: 0.1 10*3/uL (ref 0.0–0.1)
Basophils Relative: 1.1 % (ref 0.0–3.0)
Eosinophils Absolute: 0.1 10*3/uL (ref 0.0–0.7)
Eosinophils Relative: 1.7 % (ref 0.0–5.0)
HCT: 42.2 % (ref 39.0–52.0)
Hemoglobin: 14.7 g/dL (ref 13.0–17.0)
Lymphocytes Relative: 47.7 % — ABNORMAL HIGH (ref 12.0–46.0)
Lymphs Abs: 2.4 10*3/uL (ref 0.7–4.0)
MCHC: 34.7 g/dL (ref 30.0–36.0)
MCV: 93.2 fl (ref 78.0–100.0)
Monocytes Absolute: 0.4 10*3/uL (ref 0.1–1.0)
Monocytes Relative: 8.6 % (ref 3.0–12.0)
Neutro Abs: 2 10*3/uL (ref 1.4–7.7)
Neutrophils Relative %: 40.9 % — ABNORMAL LOW (ref 43.0–77.0)
Platelets: 268 10*3/uL (ref 150.0–400.0)
RBC: 4.53 Mil/uL (ref 4.22–5.81)
RDW: 13.7 % (ref 11.5–15.5)
WBC: 5 10*3/uL (ref 4.0–10.5)

## 2019-05-09 LAB — URINALYSIS, ROUTINE W REFLEX MICROSCOPIC
Bilirubin Urine: NEGATIVE
Hgb urine dipstick: NEGATIVE
Ketones, ur: NEGATIVE
Leukocytes,Ua: NEGATIVE
Nitrite: NEGATIVE
Specific Gravity, Urine: <=1.005 — AB (ref 1.000–1.030)
Total Protein, Urine: NEGATIVE
Urine Glucose: NEGATIVE
Urobilinogen, UA: 0.2 (ref 0.0–1.0)
pH: 6 (ref 5.0–8.0)

## 2019-05-09 LAB — MICROALBUMIN / CREATININE URINE RATIO
Creatinine,U: 51.6 mg/dL
Microalb Creat Ratio: 1.4 mg/g (ref 0.0–30.0)
Microalb, Ur: <0.7 mg/dL (ref 0.0–1.9)

## 2019-05-09 LAB — BASIC METABOLIC PANEL
BUN: 11 mg/dL (ref 6–23)
CO2: 32 mEq/L (ref 19–32)
Calcium: 9.6 mg/dL (ref 8.4–10.5)
Chloride: 100 mEq/L (ref 96–112)
Creatinine, Ser: 1.17 mg/dL (ref 0.40–1.50)
GFR: 76.81 mL/min (ref >60.00)
Glucose, Bld: 95 mg/dL (ref 70–99)
Potassium: 3.6 mEq/L (ref 3.5–5.1)
Sodium: 140 mEq/L (ref 135–145)

## 2019-05-09 LAB — LIPID PANEL
Cholesterol: 154 mg/dL (ref 0–200)
HDL: 60 mg/dL (ref >39.00)
LDL Cholesterol: 76 mg/dL (ref 0–99)
NonHDL: 93.99
Total CHOL/HDL Ratio: 3
Triglycerides: 88 mg/dL (ref 0.0–149.0)
VLDL: 17.6 mg/dL (ref 0.0–40.0)

## 2019-05-09 LAB — HEPATIC FUNCTION PANEL
ALT: 22 U/L (ref 0–53)
AST: 22 U/L (ref 0–37)
Albumin: 4.4 g/dL (ref 3.5–5.2)
Alkaline Phosphatase: 42 U/L (ref 39–117)
Bilirubin, Direct: 0.1 mg/dL (ref 0.0–0.3)
Total Bilirubin: 0.6 mg/dL (ref 0.2–1.2)
Total Protein: 6.9 g/dL (ref 6.0–8.3)

## 2019-05-09 LAB — HEMOGLOBIN A1C: Hgb A1c MFr Bld: 6.4 % (ref 4.6–6.5)

## 2019-05-09 LAB — PSA: PSA: 1.6 ng/mL (ref 0.10–4.00)

## 2019-05-09 LAB — TSH: TSH: 0.82 u[IU]/mL (ref 0.35–4.50)

## 2019-05-19 ENCOUNTER — Ambulatory Visit: Payer: Medicare HMO

## 2019-05-24 ENCOUNTER — Ambulatory Visit: Payer: Medicare HMO

## 2019-06-02 ENCOUNTER — Ambulatory Visit: Payer: Medicare HMO

## 2019-06-03 ENCOUNTER — Other Ambulatory Visit: Payer: Self-pay

## 2019-06-03 ENCOUNTER — Encounter: Payer: Self-pay | Admitting: Registered"

## 2019-06-03 ENCOUNTER — Encounter: Payer: Medicare HMO | Attending: Internal Medicine | Admitting: Registered"

## 2019-06-03 DIAGNOSIS — Z794 Long term (current) use of insulin: Secondary | ICD-10-CM | POA: Insufficient documentation

## 2019-06-03 DIAGNOSIS — E0822 Diabetes mellitus due to underlying condition with diabetic chronic kidney disease: Secondary | ICD-10-CM | POA: Insufficient documentation

## 2019-06-03 NOTE — Progress Notes (Signed)
Diabetes Self-Management Education  Visit Type: First/Initial  Appt. Start Time: 1405 Appt. End Time: [id number]  06/03/2019  Mr. Miguel Powell, identified by name and date of birth, is a 59 y.o. male with a diagnosis of Diabetes: Type 2.   ASSESSMENT  There were no vitals taken for this visit. There is no height or weight on file to calculate BMI.  Pt states he lives with his brother and sister and they eat together often. Pt states [place] and used to eat a lot of vegetables.   Pt reports around 2004 started prednisone for a couple of years, raised blood glucose, started metformin, but since A1c came down to 6.5% MD has discontinued metformin.   Pt states he is eating according to blood type as instructed by naturopathic doctor. Patient reports he did a candida cleanse; no bread, no salt, no sugar, mostly vegetables and was walking regularly. Pt reports he wasn't sure what kind of diet after the the cleanse and asked Dr. Jenny Reichmann for advice and was referred to [facility].   Activity: Pt reports for 2020 he was walkign 30 min 3x week, but since about Jan 2021 it had been too cold and got out of the habit. With weather warming up he plans to get back into walking.   Pt reports he gave up corn chips for 21-day fast last year but was in store last week and they were on sale and he bought a bag. Pt states he has spaghetti the other night because some people from church gave it to them.   Patient stated interest in self monitoring blood sugar.  Glucometer provided: Contour Next Lot # [id number] Exp: 06/13/19 Blood glucose: 173 mg/dL (1 hr after [employer] [place] sandwich, no drink or sides)   Diabetes Self-Management Education - 06/03/19 1423      Visit Information   Visit Type  First/Initial      Initial Visit   Diabetes Type  Type 2    Are you currently following a meal plan?  No    Are you taking your medications as prescribed?  Yes    Date Diagnosed  2005      Health Coping   How would you rate your overall health?  Good      Psychosocial Assessment   Patient Belief/Attitude about Diabetes  Motivated to manage diabetes    How often do you need to have someone help you when you read instructions, pamphlets, or other written materials from your doctor or pharmacy?  2 - Rarely    What is the last grade level you completed in school?  BS      Complications   Last HgB A1C per patient/outside source  6.4 %   march 2021   How often do you check your blood sugar?  0 times/day (not testing)    Have you had a dilated eye exam in the past 12 months?  No    Have you had a dental exam in the past 12 months?  No    Are you checking your feet?  Yes    How many days per week are you checking your feet?  7      Dietary Intake   Breakfast  buckwheat    Lunch  [place] [employer]    [employer] OR spaghetti   7:30-8:00   Snack (evening)  corn chips    Beverage(s)  water      Exercise   Exercise Type  ADL's      Patient Education   Previous Diabetes Education  No    Nutrition management   Role of diet in the treatment of diabetes and the relationship between the three main macronutrients and blood glucose level;Carbohydrate counting    Physical activity and exercise   Role of exercise on diabetes management, blood pressure control and cardiac health.    Monitoring  Taught/evaluated SMBG meter.      Individualized Goals (developed by patient)   Nutrition  General guidelines for healthy choices and portions discussed    Physical Activity  Exercise 5-7 days per week    Monitoring   test my blood glucose as discussed      Outcomes   Expected Outcomes  Demonstrated interest in learning. Expect positive outcomes    Future DMSE  4-6 wks    Program Status  Not Completed       Individualized Plan for Diabetes Self-Management Training:   Learning Objective:  Patient will have a greater understanding of diabetes self-management. Patient education plan is to attend individual  and/or group sessions per assessed needs and concerns.  Patient Instructions  Things to think about:  Vegetables everyday  Go to the grocery store with a list and stay focused with what you need to cook balanced meals  Exercise everyday - a few days a week include resistance exercise  Check your blood sugar 1 1/2 to 2 hours after eating some meals   Expected Outcomes:  Demonstrated interest in learning. Expect positive outcomes  Education material provided: ADA - How to Thrive: A Guide for Your Journey with Diabetes, Planning Healthy Meals, Steps to check Blood Sugar.  If problems or questions, patient to contact team via:  Phone and MyChart  Future DSME appointment: 4-6 wks

## 2019-06-03 NOTE — Patient Instructions (Addendum)
Things to think about:  Vegetables everyday  Go to the grocery store with a list and stay focused with what you need to cook balanced meals  Exercise everyday - a few days a week include resistance exercise  Check your blood sugar 1 1/2 to 2 hours after eating some meals

## 2019-06-07 ENCOUNTER — Ambulatory Visit: Payer: Medicare HMO

## 2019-06-11 DIAGNOSIS — R29898 Other symptoms and signs involving the musculoskeletal system: Secondary | ICD-10-CM | POA: Diagnosis not present

## 2019-06-11 DIAGNOSIS — G7000 Myasthenia gravis without (acute) exacerbation: Secondary | ICD-10-CM | POA: Diagnosis not present

## 2019-07-16 ENCOUNTER — Ambulatory Visit: Payer: Medicare HMO | Admitting: Registered"

## 2019-07-18 DIAGNOSIS — R29898 Other symptoms and signs involving the musculoskeletal system: Secondary | ICD-10-CM | POA: Diagnosis not present

## 2019-07-18 DIAGNOSIS — G729 Myopathy, unspecified: Secondary | ICD-10-CM | POA: Diagnosis not present

## 2019-07-18 DIAGNOSIS — H02402 Unspecified ptosis of left eyelid: Secondary | ICD-10-CM | POA: Diagnosis not present

## 2019-07-18 DIAGNOSIS — E782 Mixed hyperlipidemia: Secondary | ICD-10-CM | POA: Diagnosis not present

## 2019-07-18 DIAGNOSIS — Z79899 Other long term (current) drug therapy: Secondary | ICD-10-CM | POA: Diagnosis not present

## 2019-07-18 DIAGNOSIS — G7000 Myasthenia gravis without (acute) exacerbation: Secondary | ICD-10-CM | POA: Diagnosis not present

## 2019-08-15 DIAGNOSIS — H6123 Impacted cerumen, bilateral: Secondary | ICD-10-CM | POA: Diagnosis not present

## 2019-08-15 DIAGNOSIS — J310 Chronic rhinitis: Secondary | ICD-10-CM | POA: Diagnosis not present

## 2019-08-15 DIAGNOSIS — R05 Cough: Secondary | ICD-10-CM | POA: Diagnosis not present

## 2019-08-15 DIAGNOSIS — R490 Dysphonia: Secondary | ICD-10-CM | POA: Diagnosis not present

## 2019-09-12 ENCOUNTER — Other Ambulatory Visit: Payer: Self-pay | Admitting: Internal Medicine

## 2019-09-12 NOTE — Telephone Encounter (Signed)
Please refill as per office routine med refill policy (all routine meds refilled for 3 mo or monthly per pt preference up to one year from last visit, then month to month grace period for 3 mo, then further med refills will have to be denied)  

## 2019-10-06 DIAGNOSIS — G5612 Other lesions of median nerve, left upper limb: Secondary | ICD-10-CM | POA: Diagnosis not present

## 2019-10-06 DIAGNOSIS — G7000 Myasthenia gravis without (acute) exacerbation: Secondary | ICD-10-CM | POA: Diagnosis not present

## 2019-10-06 DIAGNOSIS — G5603 Carpal tunnel syndrome, bilateral upper limbs: Secondary | ICD-10-CM | POA: Diagnosis not present

## 2019-10-06 DIAGNOSIS — R94131 Abnormal electromyogram [EMG]: Secondary | ICD-10-CM | POA: Diagnosis not present

## 2019-10-06 DIAGNOSIS — G5602 Carpal tunnel syndrome, left upper limb: Secondary | ICD-10-CM | POA: Diagnosis not present

## 2019-10-07 DIAGNOSIS — G5602 Carpal tunnel syndrome, left upper limb: Secondary | ICD-10-CM | POA: Diagnosis not present

## 2019-10-16 ENCOUNTER — Other Ambulatory Visit: Payer: Self-pay | Admitting: Internal Medicine

## 2019-10-16 DIAGNOSIS — J301 Allergic rhinitis due to pollen: Secondary | ICD-10-CM

## 2019-11-14 ENCOUNTER — Ambulatory Visit (INDEPENDENT_AMBULATORY_CARE_PROVIDER_SITE_OTHER): Payer: Medicare HMO | Admitting: Internal Medicine

## 2019-11-14 ENCOUNTER — Encounter: Payer: Self-pay | Admitting: Internal Medicine

## 2019-11-14 ENCOUNTER — Other Ambulatory Visit: Payer: Self-pay

## 2019-11-14 VITALS — BP 136/74 | HR 70 | Temp 98.1°F | Ht 72.0 in | Wt 252.0 lb

## 2019-11-14 DIAGNOSIS — N182 Chronic kidney disease, stage 2 (mild): Secondary | ICD-10-CM

## 2019-11-14 DIAGNOSIS — E559 Vitamin D deficiency, unspecified: Secondary | ICD-10-CM | POA: Diagnosis not present

## 2019-11-14 DIAGNOSIS — I129 Hypertensive chronic kidney disease with stage 1 through stage 4 chronic kidney disease, or unspecified chronic kidney disease: Secondary | ICD-10-CM | POA: Diagnosis not present

## 2019-11-14 DIAGNOSIS — E0822 Diabetes mellitus due to underlying condition with diabetic chronic kidney disease: Secondary | ICD-10-CM

## 2019-11-14 DIAGNOSIS — I10 Essential (primary) hypertension: Secondary | ICD-10-CM | POA: Diagnosis not present

## 2019-11-14 DIAGNOSIS — E785 Hyperlipidemia, unspecified: Secondary | ICD-10-CM

## 2019-11-14 DIAGNOSIS — Z794 Long term (current) use of insulin: Secondary | ICD-10-CM | POA: Diagnosis not present

## 2019-11-14 DIAGNOSIS — Z1211 Encounter for screening for malignant neoplasm of colon: Secondary | ICD-10-CM

## 2019-11-14 NOTE — Patient Instructions (Addendum)
You will be contacted regarding the referral for: colonoscopy  Your ears were cleared of wax today  Please continue all other medications as before, and refills have been done if requested.  Please have the pharmacy call with any other refills you may need.  Please continue your efforts at being more active, low cholesterol diet, and weight control.  You are otherwise up to date with prevention measures today.  Please keep your appointments with your specialists as you may have planned  Please go to the LAB at the blood drawing area for the tests to be done  You will be contacted by phone if any changes need to be made immediately.  Otherwise, you will receive a letter about your results with an explanation, but please check with MyChart first.  Please remember to sign up for MyChart if you have not done so, as this will be important to you in the future with finding out test results, communicating by private email, and scheduling acute appointments online when needed.  Please make an Appointment to return in 6 months, or sooner if needed

## 2019-11-14 NOTE — Progress Notes (Signed)
Patient consent obtained. Irrigation with water and peroxide performed. Full view of both tympanic membranes after procedure.  Patient tolerated procedure well.   

## 2019-11-14 NOTE — Progress Notes (Signed)
Subjective:    Patient ID: Miguel Powell, male    DOB: 1961/01/30, 59 y.o.   MRN: [id number]  HPI  Here to f/u; overall doing ok,  Pt denies chest pain, increasing sob or doe, wheezing, orthopnea, PND, increased LE swelling, palpitations, dizziness or syncope.  Pt denies new neurological symptoms such as new headache, or facial or extremity weakness or numbness.  Pt denies polydipsia, polyuria, or low sugar episode.  Pt states overall good compliance with meds, mostly trying to follow appropriate diet, with wt overall stable,  but little exercise however. Due for optho, plans to call for appt soon to the [facility]. Willing for colonoscopy. Has bilateral hearing loss in the past wk, think may have wax impactions again.  Taking the vit d otc. Past Medical History:  Diagnosis Date  . 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 Arthritis    neck  . HLD (hyperlipidemia) 05/03/2018  . Hypertension    states BP has been elevated recently; has been on med. since 1990s  . Myasthenia gravis ([place])   . Sinus drainage 02/03/2016  . Wears partial dentures    upper    Past Surgical History:  Procedure Laterality Date  . LASER ABLATION OF CONDYLOMAS  11/04/2009  . [place]  . MINOR FULGERATION OF ANAL CONDYLOMA N/A 02/10/2016   Procedure: EXCISION AND FULGURATION OF ANAL CONDYLOMA;  Surgeon: Jackolyn Confer, MD;  Location: [place];  Service: General;  Laterality: N/A;    reports that he has never smoked. He has never used smokeless tobacco. He reports current alcohol use. He reports that he does not use drugs. family history includes Aneurysm (age of onset: 41) in his brother; Cancer in his sister; Diabetes in his mother and sister; Hypertension in his sister. Allergies  Allergen Reactions  . Azithromycin Other (See Comments)    DUE TO MYASTHENIA GRAVIS  . Botox [Botulinum Toxin Type A] Other (See Comments)    DUE TO MYASTHENIA GRAVIS  . Gentamycin [Gentamicin] Other (See  Comments)    DUE TO MYASTHENIA GRAVIS  . Ketek [Telithromycin] Other (See Comments)    DUE TO MYASTHENIA GRAVIS  . Levaquin [Levofloxacin In D5w] Other (See Comments)    DUE TO MYASTHENIA GRAVIS  . Magnesium-Containing Compounds Other (See Comments)    DUE TO MYASTHENIA GRAVIS  . Neomycin Other (See Comments)    DUE TO MYASTHENIA GRAVIS  . Penicillins Other (See Comments)    DUE TO MYASTHENIA GRAVIS   . Procainamide Other (See Comments)    DUE TO MYASTHENIA GRAVIS  . Quinine Derivatives Other (See Comments)    DUE TO MYASTHENIA GRAVIS   Current Outpatient Medications on File Prior to Visit  Medication Sig Dispense Refill  . azaTHIOprine (IMURAN) 50 MG tablet Take 200 mg by mouth daily.     Marland Kitchen azelastine (ASTELIN) 0.1 % nasal spray SPRAY TWO SPRAYS IN EACH NOSTRIL TWICE DAILY AS DIRECTED 30 mL 5  . calcium-vitamin D (OSCAL-500) 500-400 MG-UNIT tablet Take 1 tablet by mouth daily. 500-600    . Cranberry-Milk Thistle (LIVER & KIDNEY CLEANSER PO) Take by mouth.    . cyclobenzaprine (FLEXERIL) 5 MG tablet Take 1 tablet (5 mg total) by mouth 3 (three) times daily as needed for muscle spasms. 60 tablet 3  . diphenhydrAMINE (BENADRYL) 25 MG tablet Take 25 mg by mouth every 6 (six) hours as needed for sleep.     . Multiple Vitamin (MULTIVITAMIN) tablet Take 1 tablet by mouth  daily.    . olmesartan-hydrochlorothiazide (BENICAR HCT) 40-12.5 MG tablet Take 1 tablet by mouth daily. Annual appt due in Sept must see provider for future refills 90 tablet 0  . predniSONE (DELTASONE) 2.5 MG tablet     . pyridostigmine (MESTINON) 60 MG tablet Take 60 mg by mouth 4 (four) times daily.     . ranitidine (ZANTAC) 150 MG tablet Take 1 tablet (150 mg total) by mouth 2 (two) times daily. 180 tablet 3  . RESVERATROL PO Take 1 each by mouth daily.    . simethicone (GAS-X) 80 MG chewable tablet Chew 1 tablet (80 mg total) by mouth every 6 (six) hours as needed for flatulence. 30 tablet 0  . UNABLE TO FIND Take by  mouth.    . escitalopram (LEXAPRO) 10 MG tablet Take 1 tablet (10 mg total) daily by mouth. 90 tablet 3  . fexofenadine (ALLEGRA) 180 MG tablet Take 1 tablet (180 mg total) by mouth daily. 30 tablet 2   No current facility-administered medications on file prior to visit.   Review of Systems All otherwise neg per pt     Objective:   Physical Exam BP 136/74 (BP Location: Left Arm, Patient Position: Sitting, Cuff Size: Large)   Pulse 70   Temp 98.1 F (36.7 C) (Oral)   Ht 6' (1.829 m)   Wt 252 lb (114.3 kg)   SpO2 97%   BMI 34.18 kg/m  VS noted,  Constitutional: Pt appears in NAD HENT: Head: NCAT.  Right Ear: External ear normal.  Left Ear: External ear normal.  Bilateral wax impactions irrigated cleared and hearing improved Eyes: . Pupils are equal, round, and reactive to light. Conjunctivae and EOM are normal Nose: without d/c or deformity Neck: Neck supple. Gross normal ROM Cardiovascular: Normal rate and regular rhythm.   Pulmonary/Chest: Effort normal and breath sounds without rales or wheezing.  Abd:  Soft, NT, ND, + BS, no organomegaly Neurological: Pt is alert. At baseline orientation, motor grossly intact Skin: Skin is warm. No rashes, other new lesions, no LE edema Psychiatric: Pt behavior is normal without agitation  All otherwise neg per pt Lab Results  Component Value Date   WBC 5.1 11/14/2019   HGB 14.7 11/14/2019   HCT 43.6 11/14/2019   PLT 245 11/14/2019   GLUCOSE 110 (H) 11/14/2019   CHOL 164 11/14/2019   TRIG 58 11/14/2019   HDL 61 11/14/2019   LDLCALC 89 11/14/2019   ALT 29 11/14/2019   AST 25 11/14/2019   NA 143 11/14/2019   K 3.8 11/14/2019   CL 102 11/14/2019   CREATININE 1.29 (H) 11/14/2019   BUN 12 11/14/2019   CO2 32 11/14/2019   TSH 0.78 11/14/2019   PSA 1.60 05/08/2019   HGBA1C 7.5 (H) 11/14/2019   MICROALBUR <0.7 05/08/2019      Assessment & Plan:

## 2019-11-15 ENCOUNTER — Encounter: Payer: Self-pay | Admitting: Internal Medicine

## 2019-11-15 ENCOUNTER — Other Ambulatory Visit: Payer: Self-pay | Admitting: Internal Medicine

## 2019-11-15 LAB — CBC WITH DIFFERENTIAL/PLATELET
Absolute Monocytes: 520 cells/uL (ref 200–950)
Basophils Absolute: 20 cells/uL (ref 0–200)
Basophils Relative: 0.4 %
Eosinophils Absolute: 82 cells/uL (ref 15–500)
Eosinophils Relative: 1.6 %
HCT: 43.6 % (ref 38.5–50.0)
Hemoglobin: 14.7 g/dL (ref 13.2–17.1)
Lymphs Abs: 2402 cells/uL (ref 850–3900)
MCH: 31.3 pg (ref 27.0–33.0)
MCHC: 33.7 g/dL (ref 32.0–36.0)
MCV: 93 fL (ref 80.0–100.0)
MPV: 10.4 fL (ref 7.5–12.5)
Monocytes Relative: 10.2 %
Neutro Abs: 2076 cells/uL (ref 1500–7800)
Neutrophils Relative %: 40.7 %
Platelets: 245 10*3/uL (ref 140–400)
RBC: 4.69 10*6/uL (ref 4.20–5.80)
RDW: 13.1 % (ref 11.0–15.0)
Total Lymphocyte: 47.1 %
WBC: 5.1 10*3/uL (ref 3.8–10.8)

## 2019-11-15 LAB — URINALYSIS, ROUTINE W REFLEX MICROSCOPIC
Bilirubin Urine: NEGATIVE
Glucose, UA: NEGATIVE
Hgb urine dipstick: NEGATIVE
Ketones, ur: NEGATIVE
Leukocytes,Ua: NEGATIVE
Nitrite: NEGATIVE
Protein, ur: NEGATIVE
Specific Gravity, Urine: 1.009 (ref 1.001–1.03)
pH: 6.5 (ref 5.0–8.0)

## 2019-11-15 LAB — LIPID PANEL
Cholesterol: 164 mg/dL (ref <200)
HDL: 61 mg/dL (ref >=40)
LDL Cholesterol (Calc): 89 mg/dL (calc)
Non-HDL Cholesterol (Calc): 103 mg/dL (calc) (ref <130)
Total CHOL/HDL Ratio: 2.7 (calc) (ref <5.0)
Triglycerides: 58 mg/dL (ref <150)

## 2019-11-15 LAB — COMPLETE METABOLIC PANEL WITH GFR
AG Ratio: 1.7 (calc) (ref 1.0–2.5)
ALT: 29 U/L (ref 9–46)
AST: 25 U/L (ref 10–35)
Albumin: 4.1 g/dL (ref 3.6–5.1)
Alkaline phosphatase (APISO): 40 U/L (ref 35–144)
BUN/Creatinine Ratio: 9 (calc) (ref 6–22)
BUN: 12 mg/dL (ref 7–25)
CO2: 32 mmol/L (ref 20–32)
Calcium: 9.4 mg/dL (ref 8.6–10.3)
Chloride: 102 mmol/L (ref 98–110)
Creat: 1.29 mg/dL — ABNORMAL HIGH (ref 0.70–1.25)
GFR, Est African American: 69 mL/min/{1.73_m2} (ref >=60)
GFR, Est Non African American: 59 mL/min/{1.73_m2} — ABNORMAL LOW (ref >=60)
Globulin: 2.4 g/dL (calc) (ref 1.9–3.7)
Glucose, Bld: 110 mg/dL — ABNORMAL HIGH (ref 65–99)
Potassium: 3.8 mmol/L (ref 3.5–5.3)
Sodium: 143 mmol/L (ref 135–146)
Total Bilirubin: 0.5 mg/dL (ref 0.2–1.2)
Total Protein: 6.5 g/dL (ref 6.1–8.1)

## 2019-11-15 LAB — HEMOGLOBIN A1C
Hgb A1c MFr Bld: 7.5 % of total Hgb — ABNORMAL HIGH (ref <5.7)
Mean Plasma Glucose: 169 (calc)
eAG (mmol/L): 9.3 (calc)

## 2019-11-15 LAB — TSH: TSH: 0.78 mIU/L (ref 0.40–4.50)

## 2019-11-15 LAB — VITAMIN D 25 HYDROXY (VIT D DEFICIENCY, FRACTURES): Vit D, 25-Hydroxy: 55 ng/mL (ref 30–100)

## 2019-11-15 MED ORDER — METFORMIN HCL ER 500 MG PO TB24: 500.0000 mg | ORAL_TABLET | Freq: Every day | ORAL | 3 refills | Status: DC

## 2019-11-15 NOTE — Assessment & Plan Note (Addendum)
stable overall by history and exam, recent data reviewed with pt, and pt to continue medical treatment as before,  to f/u any worsening symptoms or concerns, for a1c,    I spent 31 minutes in preparing to see the patient by review of recent labs, imaging and procedures, obtaining and reviewing separately obtained history, communicating with the patient and family or caregiver, ordering medications, tests or procedures, and documenting clinical information in the EHR including the differential Dx, treatment, and any further evaluation and other management of dm, htn, hld, ckd, vit d def

## 2019-11-15 NOTE — Assessment & Plan Note (Signed)
stable overall by history and exam, recent data reviewed with pt, and pt to continue medical treatment as before,  to f/u any worsening symptoms or concerns  

## 2019-11-15 NOTE — Assessment & Plan Note (Signed)
Cont oral replacement 

## 2019-12-09 DIAGNOSIS — K219 Gastro-esophageal reflux disease without esophagitis: Secondary | ICD-10-CM | POA: Diagnosis not present

## 2019-12-09 DIAGNOSIS — R140 Abdominal distension (gaseous): Secondary | ICD-10-CM | POA: Diagnosis not present

## 2019-12-09 DIAGNOSIS — K5900 Constipation, unspecified: Secondary | ICD-10-CM | POA: Diagnosis not present

## 2019-12-14 ENCOUNTER — Other Ambulatory Visit: Payer: Self-pay | Admitting: Internal Medicine

## 2019-12-14 NOTE — Telephone Encounter (Signed)
Please refill as per office routine med refill policy (all routine meds refilled for 3 mo or monthly per pt preference up to one year from last visit, then month to month grace period for 3 mo, then further med refills will have to be denied)  

## 2019-12-15 NOTE — Telephone Encounter (Signed)
Patient called and is requesting a med refill for olmesartan-hydrochlorothiazide (BENICAR HCT) 40-12.5 MG tablet     It can be sent to [facility], [address]

## 2019-12-25 ENCOUNTER — Other Ambulatory Visit: Payer: Self-pay | Admitting: Internal Medicine

## 2020-02-26 ENCOUNTER — Other Ambulatory Visit: Payer: Self-pay

## 2020-02-26 ENCOUNTER — Other Ambulatory Visit: Payer: Medicare HMO

## 2020-02-26 DIAGNOSIS — Z20822 Contact with and (suspected) exposure to covid-19: Secondary | ICD-10-CM | POA: Diagnosis not present

## 2020-02-28 LAB — SARS-COV-2, NAA 2 DAY TAT

## 2020-02-28 LAB — NOVEL CORONAVIRUS, NAA: SARS-CoV-2, NAA: NOT DETECTED

## 2020-03-19 DIAGNOSIS — Z794 Long term (current) use of insulin: Secondary | ICD-10-CM | POA: Diagnosis not present

## 2020-03-19 DIAGNOSIS — G729 Myopathy, unspecified: Secondary | ICD-10-CM | POA: Diagnosis not present

## 2020-03-19 DIAGNOSIS — E0822 Diabetes mellitus due to underlying condition with diabetic chronic kidney disease: Secondary | ICD-10-CM | POA: Diagnosis not present

## 2020-03-19 DIAGNOSIS — H532 Diplopia: Secondary | ICD-10-CM | POA: Diagnosis not present

## 2020-03-19 DIAGNOSIS — R7309 Other abnormal glucose: Secondary | ICD-10-CM | POA: Diagnosis not present

## 2020-03-19 DIAGNOSIS — G7000 Myasthenia gravis without (acute) exacerbation: Secondary | ICD-10-CM | POA: Diagnosis not present

## 2020-03-19 DIAGNOSIS — G5602 Carpal tunnel syndrome, left upper limb: Secondary | ICD-10-CM | POA: Diagnosis not present

## 2020-03-19 DIAGNOSIS — H02402 Unspecified ptosis of left eyelid: Secondary | ICD-10-CM | POA: Diagnosis not present

## 2020-03-23 ENCOUNTER — Telehealth: Payer: Self-pay | Admitting: Internal Medicine

## 2020-03-23 ENCOUNTER — Other Ambulatory Visit: Payer: Self-pay | Admitting: Internal Medicine

## 2020-03-23 NOTE — Telephone Encounter (Signed)
   Patient calling with concerns about labs recently done at [facility] (?Dr Oswaldo Conroy) Patient would like Dr Jenny Reichmann to take a look at results and advise Advised patient also to follow up with ordering provider from [facility].

## 2020-03-23 NOTE — Telephone Encounter (Signed)
Please refill as per office routine med refill policy (all routine meds refilled for 3 mo or monthly per pt preference up to one year from last visit, then month to month grace period for 3 mo, then further med refills will have to be denied)  

## 2020-03-24 NOTE — Telephone Encounter (Signed)
Yes, I should not try to interpret labs ordered by another provider, since I was not at that exam to know what the issues were. Thanks

## 2020-03-25 NOTE — Telephone Encounter (Signed)
Notified patient.

## 2020-04-09 ENCOUNTER — Telehealth: Payer: Self-pay | Admitting: Internal Medicine

## 2020-04-09 DIAGNOSIS — E119 Type 2 diabetes mellitus without complications: Secondary | ICD-10-CM

## 2020-04-09 NOTE — Telephone Encounter (Signed)
Spoke with Dr Charma Igo neurology who simply asks [place] to order a cbc and cmp for this pt at our [facility] so that the pt does not have to drive to [place] for this; this is to be reviewed to determine If he is a candidate for a myasthenia gravis study

## 2020-04-14 DIAGNOSIS — K6389 Other specified diseases of intestine: Secondary | ICD-10-CM | POA: Diagnosis not present

## 2020-04-14 DIAGNOSIS — D122 Benign neoplasm of ascending colon: Secondary | ICD-10-CM | POA: Diagnosis not present

## 2020-04-14 DIAGNOSIS — Z1211 Encounter for screening for malignant neoplasm of colon: Secondary | ICD-10-CM | POA: Diagnosis not present

## 2020-04-14 DIAGNOSIS — K635 Polyp of colon: Secondary | ICD-10-CM | POA: Diagnosis not present

## 2020-04-20 ENCOUNTER — Other Ambulatory Visit: Payer: Self-pay

## 2020-04-20 ENCOUNTER — Other Ambulatory Visit (INDEPENDENT_AMBULATORY_CARE_PROVIDER_SITE_OTHER): Payer: Medicare HMO

## 2020-04-20 DIAGNOSIS — E119 Type 2 diabetes mellitus without complications: Secondary | ICD-10-CM | POA: Diagnosis not present

## 2020-04-20 LAB — CBC WITH DIFFERENTIAL/PLATELET
Basophils Absolute: 0.1 10*3/uL (ref 0.0–0.1)
Basophils Relative: 1.2 % (ref 0.0–3.0)
Eosinophils Absolute: 0.1 10*3/uL (ref 0.0–0.7)
Eosinophils Relative: 1.3 % (ref 0.0–5.0)
HCT: 42.5 % (ref 39.0–52.0)
Hemoglobin: 14.5 g/dL (ref 13.0–17.0)
Lymphocytes Relative: 47.9 % — ABNORMAL HIGH (ref 12.0–46.0)
Lymphs Abs: 2.1 10*3/uL (ref 0.7–4.0)
MCHC: 34 g/dL (ref 30.0–36.0)
MCV: 94.1 fl (ref 78.0–100.0)
Monocytes Absolute: 0.4 10*3/uL (ref 0.1–1.0)
Monocytes Relative: 9.5 % (ref 3.0–12.0)
Neutro Abs: 1.8 10*3/uL (ref 1.4–7.7)
Neutrophils Relative %: 40.1 % — ABNORMAL LOW (ref 43.0–77.0)
Platelets: 293 10*3/uL (ref 150.0–400.0)
RBC: 4.52 Mil/uL (ref 4.22–5.81)
RDW: 14.8 % (ref 11.5–15.5)
WBC: 4.4 10*3/uL (ref 4.0–10.5)

## 2020-04-20 LAB — BASIC METABOLIC PANEL
BUN: 15 mg/dL (ref 6–23)
CO2: 35 mEq/L — ABNORMAL HIGH (ref 19–32)
Calcium: 10.1 mg/dL (ref 8.4–10.5)
Chloride: 101 mEq/L (ref 96–112)
Creatinine, Ser: 1.14 mg/dL (ref 0.40–1.50)
GFR: 69.43 mL/min (ref >60.00)
Glucose, Bld: 76 mg/dL (ref 70–99)
Potassium: 4.6 mEq/L (ref 3.5–5.1)
Sodium: 143 mEq/L (ref 135–145)

## 2020-04-20 LAB — HEPATIC FUNCTION PANEL
ALT: 57 U/L — ABNORMAL HIGH (ref 0–53)
AST: 40 U/L — ABNORMAL HIGH (ref 0–37)
Albumin: 4.2 g/dL (ref 3.5–5.2)
Alkaline Phosphatase: 46 U/L (ref 39–117)
Bilirubin, Direct: 0.2 mg/dL (ref 0.0–0.3)
Total Bilirubin: 0.9 mg/dL (ref 0.2–1.2)
Total Protein: 7.4 g/dL (ref 6.0–8.3)

## 2020-04-21 DIAGNOSIS — R945 Abnormal results of liver function studies: Secondary | ICD-10-CM | POA: Diagnosis not present

## 2020-04-21 DIAGNOSIS — R29898 Other symptoms and signs involving the musculoskeletal system: Secondary | ICD-10-CM | POA: Diagnosis not present

## 2020-04-21 DIAGNOSIS — Z5181 Encounter for therapeutic drug level monitoring: Secondary | ICD-10-CM | POA: Diagnosis not present

## 2020-04-21 DIAGNOSIS — G7000 Myasthenia gravis without (acute) exacerbation: Secondary | ICD-10-CM | POA: Diagnosis not present

## 2020-04-22 DIAGNOSIS — Z5181 Encounter for therapeutic drug level monitoring: Secondary | ICD-10-CM | POA: Diagnosis not present

## 2020-04-22 DIAGNOSIS — R945 Abnormal results of liver function studies: Secondary | ICD-10-CM | POA: Diagnosis not present

## 2020-04-26 DIAGNOSIS — A630 Anogenital (venereal) warts: Secondary | ICD-10-CM | POA: Diagnosis not present

## 2020-05-14 ENCOUNTER — Ambulatory Visit: Payer: Medicare HMO | Admitting: Internal Medicine

## 2020-06-11 ENCOUNTER — Ambulatory Visit (INDEPENDENT_AMBULATORY_CARE_PROVIDER_SITE_OTHER): Payer: Medicare HMO | Admitting: Internal Medicine

## 2020-06-11 ENCOUNTER — Encounter: Payer: Self-pay | Admitting: Internal Medicine

## 2020-06-11 ENCOUNTER — Other Ambulatory Visit: Payer: Self-pay

## 2020-06-11 VITALS — BP 148/88 | HR 61 | Temp 97.9°F | Ht 72.0 in | Wt 226.0 lb

## 2020-06-11 DIAGNOSIS — Z0001 Encounter for general adult medical examination with abnormal findings: Secondary | ICD-10-CM

## 2020-06-11 DIAGNOSIS — F411 Generalized anxiety disorder: Secondary | ICD-10-CM

## 2020-06-11 DIAGNOSIS — I10 Essential (primary) hypertension: Secondary | ICD-10-CM

## 2020-06-11 DIAGNOSIS — E0822 Diabetes mellitus due to underlying condition with diabetic chronic kidney disease: Secondary | ICD-10-CM | POA: Diagnosis not present

## 2020-06-11 DIAGNOSIS — E538 Deficiency of other specified B group vitamins: Secondary | ICD-10-CM | POA: Diagnosis not present

## 2020-06-11 DIAGNOSIS — Z0000 Encounter for general adult medical examination without abnormal findings: Secondary | ICD-10-CM

## 2020-06-11 DIAGNOSIS — Z794 Long term (current) use of insulin: Secondary | ICD-10-CM | POA: Diagnosis not present

## 2020-06-11 DIAGNOSIS — N182 Chronic kidney disease, stage 2 (mild): Secondary | ICD-10-CM | POA: Diagnosis not present

## 2020-06-11 DIAGNOSIS — E785 Hyperlipidemia, unspecified: Secondary | ICD-10-CM | POA: Diagnosis not present

## 2020-06-11 DIAGNOSIS — E119 Type 2 diabetes mellitus without complications: Secondary | ICD-10-CM | POA: Diagnosis not present

## 2020-06-11 DIAGNOSIS — E559 Vitamin D deficiency, unspecified: Secondary | ICD-10-CM

## 2020-06-11 LAB — URINALYSIS, ROUTINE W REFLEX MICROSCOPIC
Bilirubin Urine: NEGATIVE
Ketones, ur: NEGATIVE
Leukocytes,Ua: NEGATIVE
Nitrite: NEGATIVE
Specific Gravity, Urine: <=1.005 — AB (ref 1.000–1.030)
Total Protein, Urine: NEGATIVE
Urine Glucose: NEGATIVE
Urobilinogen, UA: 0.2 (ref 0.0–1.0)
pH: 6.5 (ref 5.0–8.0)

## 2020-06-11 LAB — CBC WITH DIFFERENTIAL/PLATELET
Basophils Absolute: 0 10*3/uL (ref 0.0–0.1)
Basophils Relative: 0.6 % (ref 0.0–3.0)
Eosinophils Absolute: 0.1 10*3/uL (ref 0.0–0.7)
Eosinophils Relative: 1.3 % (ref 0.0–5.0)
HCT: 41.8 % (ref 39.0–52.0)
Hemoglobin: 13.9 g/dL (ref 13.0–17.0)
Lymphocytes Relative: 46.8 % — ABNORMAL HIGH (ref 12.0–46.0)
Lymphs Abs: 2.1 10*3/uL (ref 0.7–4.0)
MCHC: 33.4 g/dL (ref 30.0–36.0)
MCV: 96.4 fl (ref 78.0–100.0)
Monocytes Absolute: 0.4 10*3/uL (ref 0.1–1.0)
Monocytes Relative: 9.8 % (ref 3.0–12.0)
Neutro Abs: 1.8 10*3/uL (ref 1.4–7.7)
Neutrophils Relative %: 41.5 % — ABNORMAL LOW (ref 43.0–77.0)
Platelets: 250 10*3/uL (ref 150.0–400.0)
RBC: 4.33 Mil/uL (ref 4.22–5.81)
RDW: 14.5 % (ref 11.5–15.5)
WBC: 4.4 10*3/uL (ref 4.0–10.5)

## 2020-06-11 LAB — BASIC METABOLIC PANEL
BUN: 11 mg/dL (ref 6–23)
CO2: 35 mEq/L — ABNORMAL HIGH (ref 19–32)
Calcium: 9.5 mg/dL (ref 8.4–10.5)
Chloride: 99 mEq/L (ref 96–112)
Creatinine, Ser: 1.17 mg/dL (ref 0.40–1.50)
GFR: 67.23 mL/min (ref >60.00)
Glucose, Bld: 82 mg/dL (ref 70–99)
Potassium: 3.9 mEq/L (ref 3.5–5.1)
Sodium: 141 mEq/L (ref 135–145)

## 2020-06-11 LAB — LIPID PANEL
Cholesterol: 164 mg/dL (ref 0–200)
HDL: 66.2 mg/dL (ref >39.00)
LDL Cholesterol: 85 mg/dL (ref 0–99)
NonHDL: 97.91
Total CHOL/HDL Ratio: 2
Triglycerides: 66 mg/dL (ref 0.0–149.0)
VLDL: 13.2 mg/dL (ref 0.0–40.0)

## 2020-06-11 LAB — HEPATIC FUNCTION PANEL
ALT: 51 U/L (ref 0–53)
AST: 36 U/L (ref 0–37)
Albumin: 4.2 g/dL (ref 3.5–5.2)
Alkaline Phosphatase: 48 U/L (ref 39–117)
Bilirubin, Direct: 0.2 mg/dL (ref 0.0–0.3)
Total Bilirubin: 0.9 mg/dL (ref 0.2–1.2)
Total Protein: 6.6 g/dL (ref 6.0–8.3)

## 2020-06-11 LAB — VITAMIN B12: Vitamin B-12: 615 pg/mL (ref 211–911)

## 2020-06-11 LAB — VITAMIN D 25 HYDROXY (VIT D DEFICIENCY, FRACTURES): VITD: 75.99 ng/mL (ref 30.00–100.00)

## 2020-06-11 LAB — PSA: PSA: 1.79 ng/mL (ref 0.10–4.00)

## 2020-06-11 LAB — MICROALBUMIN / CREATININE URINE RATIO
Creatinine,U: 30.7 mg/dL
Microalb Creat Ratio: 2.3 mg/g (ref 0.0–30.0)
Microalb, Ur: <0.7 mg/dL (ref 0.0–1.9)

## 2020-06-11 LAB — TSH: TSH: 0.84 u[IU]/mL (ref 0.35–4.50)

## 2020-06-11 LAB — HEMOGLOBIN A1C: Hgb A1c MFr Bld: 6.2 % (ref 4.6–6.5)

## 2020-06-11 NOTE — Progress Notes (Signed)
Patient ID: Miguel Powell, male   DOB: [birth year], 60 y.o.   MRN: [id number]         Chief Complaint:: wellness exam and Follow-up  htn, dm, low K, and hld       HPI:  Miguel Powell is a 60 y.o. male here for wellness exam; has eye exam scheduled may 2022.  O.w up to date with preventive referrals and immunizations                        Also lost 17 lbs with better diet.  Did not take BP meds due to fasting for labs today.  Trying to follow a lower chol diet, declines statin today.  Asks for recheck K as has been low in past on HCT.  Pt denies chest pain, increased sob or doe, wheezing, orthopnea, PND, increased LE swelling, palpitations, dizziness or syncope.   Pt denies polydipsia, polyuria, or new focal neuro s/s.   Pt denies fever, wt loss, night sweats, loss of appetite, or other constitutional symptoms      Wt Readings from Last 3 Encounters:  06/11/20 226 lb (102.5 kg)  11/14/19 252 lb (114.3 kg)  05/08/19 238 lb 6.4 oz (108.1 kg)   BP Readings from Last 3 Encounters:  06/11/20 (!) 148/88  11/14/19 136/74  05/08/19 128/80   Immunization History  Administered Date(s) Administered  . PFIZER(Purple Top)SARS-COV-2 Vaccination 08/21/2019, 09/16/2019, 03/16/2020  . PPD Test 08/12/2018  . Pneumococcal Conjugate-13 08/12/2018  . Tdap 10/30/2011   Health Maintenance Due  Topic Date Due  . OPHTHALMOLOGY EXAM  08/05/2019      Past Medical History:  Diagnosis Date  . 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 Arthritis    neck  . HLD (hyperlipidemia) 05/03/2018  . Hypertension    states BP has been elevated recently; has been on med. since 1990s  . Myasthenia gravis ([place])   . Sinus drainage 02/03/2016  . Wears partial dentures    upper    Past Surgical History:  Procedure Laterality Date  . LASER ABLATION OF CONDYLOMAS  11/04/2009  . [place]  . MINOR FULGERATION OF ANAL CONDYLOMA N/A 02/10/2016   Procedure: EXCISION AND FULGURATION OF ANAL CONDYLOMA;  Surgeon: Jackolyn Confer, MD;  Location: [place];  Service: General;  Laterality: N/A;    reports that he has never smoked. He has never used smokeless tobacco. He reports current alcohol use. He reports that he does not use drugs. family history includes Aneurysm (age of onset: 26) in his brother; Cancer in his sister; Diabetes in his mother and sister; Hypertension in his sister. Allergies  Allergen Reactions  . Azithromycin Other (See Comments)    DUE TO MYASTHENIA GRAVIS  . Botox [Botulinum Toxin Type A] Other (See Comments)    DUE TO MYASTHENIA GRAVIS  . Gentamycin [Gentamicin] Other (See Comments)    DUE TO MYASTHENIA GRAVIS  . Ketek [Telithromycin] Other (See Comments)    DUE TO MYASTHENIA GRAVIS  . Levaquin [Levofloxacin In D5w] Other (See Comments)    DUE TO MYASTHENIA GRAVIS  . Magnesium-Containing Compounds Other (See Comments)    DUE TO MYASTHENIA GRAVIS  . Neomycin Other (See Comments)    DUE TO MYASTHENIA GRAVIS  . Penicillins Other (See Comments)    DUE TO MYASTHENIA GRAVIS   . Procainamide Other (See Comments)    DUE TO MYASTHENIA GRAVIS  . Quinine Derivatives Other (See Comments)    DUE  TO MYASTHENIA GRAVIS   Current Outpatient Medications on File Prior to Visit  Medication Sig Dispense Refill  . ALPHA LIPOIC ACID PO Take by mouth.    . azaTHIOprine (IMURAN) 50 MG tablet Take 200 mg by mouth daily.    Marland Kitchen azelastine (ASTELIN) 0.1 % nasal spray SPRAY TWO SPRAYS IN EACH NOSTRIL TWICE DAILY AS DIRECTED 30 mL 5  . calcium-vitamin D (OSCAL-500) 500-400 MG-UNIT tablet Take 1 tablet by mouth daily. 500-600    . CHROMIUM PO Take by mouth.    Lovell Sheehan Thistle (LIVER & KIDNEY CLEANSER PO) Take by mouth.    . cyclobenzaprine (FLEXERIL) 5 MG tablet Take 1 tablet by mouth three times daily as needed for muscle spasm 60 tablet 0  . diphenhydrAMINE (BENADRYL) 25 MG tablet Take 25 mg by mouth every 6 (six) hours as needed for sleep.     . metFORMIN (GLUCOPHAGE-XR) 500  MG 24 hr tablet Take 1 tablet (500 mg total) by mouth daily with breakfast. 90 tablet 3  . Misc Natural Products (HEALTHY LIVER PO) Take by mouth.    . Multiple Vitamin (MULTIVITAMIN) tablet Take 1 tablet by mouth daily.    . NON FORMULARY Adult stem cell activator    . NON FORMULARY PC brain and liver    . olmesartan-hydrochlorothiazide (BENICAR HCT) 40-12.5 MG tablet TAKE 1 TABLET BY MOUTH ONCE DAILY (NEED APPOINTMENT FOR FURTHER REFILLS) 90 tablet 0  . predniSONE (DELTASONE) 2.5 MG tablet     . pyridostigmine (MESTINON) 60 MG tablet Take 60 mg by mouth 4 (four) times daily.    . ranitidine (ZANTAC) 150 MG tablet Take 1 tablet (150 mg total) by mouth 2 (two) times daily. 180 tablet 3  . RESVERATROL PO Take 1 each by mouth daily.    . simethicone (GAS-X) 80 MG chewable tablet Chew 1 tablet (80 mg total) by mouth every 6 (six) hours as needed for flatulence. 30 tablet 0  . UNABLE TO FIND Take by mouth.     No current facility-administered medications on file prior to visit.        ROS:  All others reviewed and negative.  Objective        PE:  BP (!) 148/88 (BP Location: Left Arm, Patient Position: Sitting, Cuff Size: Large)   Pulse 61   Temp 97.9 F (36.6 C) (Oral)   Ht 6' (1.829 m)   Wt 226 lb (102.5 kg)   SpO2 96%   BMI 30.65 kg/m                 Constitutional: Pt appears in NAD               HENT: Head: NCAT.                Right Ear: External ear normal.                 Left Ear: External ear normal.                Eyes: . Pupils are equal, round, and reactive to light. Conjunctivae and EOM are normal               Nose: without d/c or deformity               Neck: Neck supple. Gross normal ROM               Cardiovascular: Normal rate and regular rhythm.  Pulmonary/Chest: Effort normal and breath sounds without rales or wheezing.                Abd:  Soft, NT, ND, + BS, no organomegaly               Neurological: Pt is alert. At baseline orientation, motor  grossly intact               Skin: Skin is warm. No rashes, no other new lesions, LE edema - none               Psychiatric: Pt behavior is normal without agitation   Micro: none  Cardiac tracings I have personally interpreted today:  none  Pertinent Radiological findings (summarize): none   Lab Results  Component Value Date   WBC 4.4 06/11/2020   HGB 13.9 06/11/2020   HCT 41.8 06/11/2020   PLT 250.0 06/11/2020   GLUCOSE 82 06/11/2020   CHOL 164 06/11/2020   TRIG 66.0 06/11/2020   HDL 66.20 06/11/2020   LDLCALC 85 06/11/2020   ALT 51 06/11/2020   AST 36 06/11/2020   NA 141 06/11/2020   K 3.9 06/11/2020   CL 99 06/11/2020   CREATININE 1.17 06/11/2020   BUN 11 06/11/2020   CO2 35 (H) 06/11/2020   TSH 0.84 06/11/2020   PSA 1.79 06/11/2020   HGBA1C 6.2 06/11/2020   MICROALBUR <0.7 06/11/2020   Assessment/Plan:  Zakaree Mcclenahan is a 60 y.o. Black or African American [2] male with  has a past medical history of Anal condyloma (01/2016), Arthritis, HLD (hyperlipidemia) (05/03/2018), Hypertension, Myasthenia gravis ([place]), Sinus drainage (02/03/2016), and Wears partial dentures.  Encounter for well adult exam with abnormal findings Age and sex appropriate education and counseling updated with regular exercise and diet Referrals for preventative services - to f/u optho as planned Immunizations addressed - none needed Smoking counseling  - none needed Evidence for depression or other mood disorder - has ongoing anxiety, declines med change needed Most recent labs reviewed. 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, with long-term current use of insulin ([place]) Lab Results  Component Value Date   HGBA1C 6.2 06/11/2020   Stable, pt to continue current medical treatment - metformin   CKD (chronic kidney disease), stage II Lab Results  Component Value Date   CREATININE 1.17 06/11/2020   Stable overall, cont to avoid nephrotoxins   Anxiety state Stable overall, cont current med tx  HLD (hyperlipidemia) Lab Results  Component Value Date   LDLCALC 85 06/11/2020   Goal ldl < 70, pt to continue low chol diet - declines statin as has some concern recently over elevated LFTs and possible drug tx side effect   Followup: Return in about 6 months (around 12/11/2020).  Cathlean Cower, MD 06/12/2020 1:44 AM [facility]

## 2020-06-11 NOTE — Patient Instructions (Signed)

## 2020-06-12 ENCOUNTER — Encounter: Payer: Self-pay | Admitting: Internal Medicine

## 2020-06-12 NOTE — Assessment & Plan Note (Signed)
Lab Results  Component Value Date   CREATININE 1.17 06/11/2020   Stable overall, cont to avoid nephrotoxins

## 2020-06-12 NOTE — Assessment & Plan Note (Signed)
BP Readings from Last 3 Encounters:  06/11/20 (!) 148/88  11/14/19 136/74  05/08/19 128/80   uncontrolled, pt to restart medical treatment benicar hct

## 2020-06-12 NOTE — Assessment & Plan Note (Signed)
Stable overall, cont current med tx 

## 2020-06-12 NOTE — Assessment & Plan Note (Signed)
Last vitamin D ?Lab Results  ?Component Value Date  ? VD25OH 75.99 06/11/2020  ? ?Stable, cont oral replacement ? ?

## 2020-06-12 NOTE — Assessment & Plan Note (Signed)
Lab Results  Component Value Date   HGBA1C 6.2 06/11/2020   Stable, pt to continue current medical treatment - metformin

## 2020-06-12 NOTE — Assessment & Plan Note (Signed)
Age and sex appropriate education and counseling updated with regular exercise and diet Referrals for preventative services - to f/u optho as planned Immunizations addressed - none needed Smoking counseling  - none needed Evidence for depression or other mood disorder - has ongoing anxiety, declines med change needed Most recent labs reviewed. I have personally reviewed and have noted: 1) the patient's medical and social history 2) The patient's current medications and supplements 3) The patient's height, weight, and BMI have been recorded in the chart

## 2020-06-12 NOTE — Assessment & Plan Note (Signed)
Lab Results  Component Value Date   LDLCALC 85 06/11/2020   Goal ldl < 70, pt to continue low chol diet - declines statin as has some concern recently over elevated LFTs and possible drug tx side effect

## 2020-06-25 ENCOUNTER — Other Ambulatory Visit: Payer: Self-pay | Admitting: Internal Medicine

## 2020-06-25 NOTE — Telephone Encounter (Signed)
Please refill as per office routine med refill policy (all routine meds refilled for 3 mo or monthly per pt preference up to one year from last visit, then month to month grace period for 3 mo, then further med refills will have to be denied)  

## 2020-06-28 DIAGNOSIS — R7989 Other specified abnormal findings of blood chemistry: Secondary | ICD-10-CM | POA: Diagnosis not present

## 2020-06-29 DIAGNOSIS — Z1159 Encounter for screening for other viral diseases: Secondary | ICD-10-CM | POA: Diagnosis not present

## 2020-06-29 DIAGNOSIS — K219 Gastro-esophageal reflux disease without esophagitis: Secondary | ICD-10-CM | POA: Diagnosis not present

## 2020-06-29 DIAGNOSIS — E785 Hyperlipidemia, unspecified: Secondary | ICD-10-CM | POA: Diagnosis not present

## 2020-06-29 DIAGNOSIS — Z79899 Other long term (current) drug therapy: Secondary | ICD-10-CM | POA: Diagnosis not present

## 2020-06-29 DIAGNOSIS — R7989 Other specified abnormal findings of blood chemistry: Secondary | ICD-10-CM | POA: Diagnosis not present

## 2020-06-29 DIAGNOSIS — E119 Type 2 diabetes mellitus without complications: Secondary | ICD-10-CM | POA: Diagnosis not present

## 2020-06-29 DIAGNOSIS — Z7984 Long term (current) use of oral hypoglycemic drugs: Secondary | ICD-10-CM | POA: Diagnosis not present

## 2020-06-29 DIAGNOSIS — G7000 Myasthenia gravis without (acute) exacerbation: Secondary | ICD-10-CM | POA: Diagnosis not present

## 2020-07-06 DIAGNOSIS — G7000 Myasthenia gravis without (acute) exacerbation: Secondary | ICD-10-CM | POA: Diagnosis not present

## 2020-07-06 DIAGNOSIS — H02402 Unspecified ptosis of left eyelid: Secondary | ICD-10-CM | POA: Diagnosis not present

## 2020-07-06 DIAGNOSIS — E119 Type 2 diabetes mellitus without complications: Secondary | ICD-10-CM | POA: Diagnosis not present

## 2020-07-26 ENCOUNTER — Other Ambulatory Visit: Payer: Self-pay | Admitting: Internal Medicine

## 2020-07-26 NOTE — Telephone Encounter (Signed)
Patient is requesting a refill of the following medications: Requested Prescriptions   Pending Prescriptions Disp Refills   cyclobenzaprine (FLEXERIL) 5 MG tablet [Pharmacy Med Name: Cyclobenzaprine HCl 5 MG Oral Tablet] 60 tablet 0    Sig: Take 1 tablet by mouth three times daily as needed for muscle spasm    Date of patient request: 07/26/20  Last office visit: 06/11/20  Date of last refill: 12/25/19  Last refill amount: 60,0 Follow up time period per chart: 12/24/20

## 2020-08-04 DIAGNOSIS — Z20822 Contact with and (suspected) exposure to covid-19: Secondary | ICD-10-CM | POA: Diagnosis not present

## 2020-08-19 DIAGNOSIS — R7989 Other specified abnormal findings of blood chemistry: Secondary | ICD-10-CM | POA: Diagnosis not present

## 2020-08-19 DIAGNOSIS — K760 Fatty (change of) liver, not elsewhere classified: Secondary | ICD-10-CM | POA: Diagnosis not present

## 2020-08-26 DIAGNOSIS — G7000 Myasthenia gravis without (acute) exacerbation: Secondary | ICD-10-CM | POA: Diagnosis not present

## 2020-09-22 DIAGNOSIS — Z20822 Contact with and (suspected) exposure to covid-19: Secondary | ICD-10-CM | POA: Diagnosis not present

## 2020-09-23 ENCOUNTER — Telehealth: Payer: Self-pay | Admitting: Internal Medicine

## 2020-09-23 ENCOUNTER — Other Ambulatory Visit: Payer: Self-pay | Admitting: Internal Medicine

## 2020-09-23 MED ORDER — NIRMATRELVIR/RITONAVIR (PAXLOVID)TABLET: 3.0000 | ORAL_TABLET | Freq: Two times a day (BID) | ORAL | 0 refills | Status: AC

## 2020-09-23 MED ORDER — HYDROCODONE BIT-HOMATROP MBR 5-1.5 MG/5ML PO SOLN: 5.0000 mL | Freq: Four times a day (QID) | ORAL | 0 refills | Status: AC | PRN

## 2020-09-23 NOTE — Telephone Encounter (Signed)
   Patient calling to report he is COVID+ Cough, congestion No other symptoms Declined appointment at this time  Seeking advice

## 2020-09-23 NOTE — Telephone Encounter (Signed)
Please refill as per office routine med refill policy (all routine meds refilled for 3 mo or monthly per pt preference up to one year from last visit, then month to month grace period for 3 mo, then further med refills will have to be denied)  

## 2020-09-23 NOTE — Telephone Encounter (Signed)
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

## 2020-09-24 NOTE — Telephone Encounter (Signed)
Called pt, LVM.   

## 2020-09-30 DIAGNOSIS — Z20822 Contact with and (suspected) exposure to covid-19: Secondary | ICD-10-CM | POA: Diagnosis not present

## 2020-10-04 NOTE — Telephone Encounter (Signed)
Pt tested again on 8/19 and still testing positive. Pharmacist advised him to not take the cough med due to it being too many interactions with his current meds.  Requesting advise and callback.   Best callback #: [phone]

## 2020-10-05 NOTE — Telephone Encounter (Signed)
Pt returned call. Requesting a callback to [phone]

## 2020-10-05 NOTE — Telephone Encounter (Signed)
Pt states that he continues to have a mild cough with phlegm & test positive. He did not take Paxlovid b/c he was advised by his pharmacist that it would interact with his medications. Pt advised that it is not necessary to test anymore & to try OTC medications for cough; reassured that a cough can linger for weeks after Covid. Pt denies SOB.  Pt verb understanding.

## 2020-10-05 NOTE — Telephone Encounter (Signed)
LVM instructions for pt to call back; more information needed.

## 2020-10-14 DIAGNOSIS — Z20822 Contact with and (suspected) exposure to covid-19: Secondary | ICD-10-CM | POA: Diagnosis not present

## 2020-10-20 DIAGNOSIS — Z20822 Contact with and (suspected) exposure to covid-19: Secondary | ICD-10-CM | POA: Diagnosis not present

## 2020-10-25 ENCOUNTER — Ambulatory Visit: Payer: Medicare HMO | Admitting: Internal Medicine

## 2020-10-26 ENCOUNTER — Ambulatory Visit: Payer: Medicare HMO | Admitting: Internal Medicine

## 2020-11-01 ENCOUNTER — Telehealth: Payer: Self-pay

## 2020-11-01 DIAGNOSIS — R112 Nausea with vomiting, unspecified: Secondary | ICD-10-CM | POA: Diagnosis not present

## 2020-11-01 DIAGNOSIS — Z20822 Contact with and (suspected) exposure to covid-19: Secondary | ICD-10-CM | POA: Diagnosis not present

## 2020-11-01 NOTE — Telephone Encounter (Signed)
Pt has stated he woke up today vomiting and having a slight headache. I advised the pt to please take another COVID test as he does have an apptmnt with Dr. Jenny Reichmann on Wednesday and these are new sxs.

## 2020-11-02 DIAGNOSIS — Z20822 Contact with and (suspected) exposure to covid-19: Secondary | ICD-10-CM | POA: Diagnosis not present

## 2020-11-03 ENCOUNTER — Ambulatory Visit (INDEPENDENT_AMBULATORY_CARE_PROVIDER_SITE_OTHER): Payer: Medicare HMO | Admitting: Internal Medicine

## 2020-11-03 ENCOUNTER — Encounter: Payer: Self-pay | Admitting: Internal Medicine

## 2020-11-03 ENCOUNTER — Ambulatory Visit: Payer: Medicare HMO | Admitting: Internal Medicine

## 2020-11-03 ENCOUNTER — Other Ambulatory Visit: Payer: Self-pay

## 2020-11-03 VITALS — BP 158/80 | HR 67 | Temp 98.2°F | Ht 72.0 in | Wt 228.0 lb

## 2020-11-03 DIAGNOSIS — I10 Essential (primary) hypertension: Secondary | ICD-10-CM

## 2020-11-03 DIAGNOSIS — Z794 Long term (current) use of insulin: Secondary | ICD-10-CM

## 2020-11-03 DIAGNOSIS — J069 Acute upper respiratory infection, unspecified: Secondary | ICD-10-CM

## 2020-11-03 DIAGNOSIS — N182 Chronic kidney disease, stage 2 (mild): Secondary | ICD-10-CM | POA: Diagnosis not present

## 2020-11-03 DIAGNOSIS — E0822 Diabetes mellitus due to underlying condition with diabetic chronic kidney disease: Secondary | ICD-10-CM | POA: Diagnosis not present

## 2020-11-03 DIAGNOSIS — H9193 Unspecified hearing loss, bilateral: Secondary | ICD-10-CM | POA: Diagnosis not present

## 2020-11-03 NOTE — Patient Instructions (Addendum)
Please continue all other medications as before, and refills have been done if requested.  Please have the pharmacy call with any other refills you may need.  Please continue your efforts at being more active, low cholesterol diet, and weight control.  Please keep your appointments with your specialists as you may have planned  Please go to the LAB at the blood drawing area for the tests to be done  You will be contacted by phone if any changes need to be made immediately.  Otherwise, you will receive a letter about your results with an explanation, but please check with MyChart first.  Please remember to sign up for MyChart if you have not done so, as this will be important to you in the future with finding out test results, communicating by private email, and scheduling acute appointments online when needed.  Please make an Appointment to return in Nov 11 as scheduled or sooner if needed,

## 2020-11-03 NOTE — Progress Notes (Signed)
Patient ID: Dadrian Ballantine, male   DOB: 1960-10-03, 60 y.o.   MRN: [id number]        Chief Complaint: follow up recent viral illness, elevated BP, and bilateral hearing reduced       HPI:  Auther Lyerly is a 60 y.o. male here with c/o URI symptoms associated with nausea and diarrhea and vertigo, now much improved and essentially resolved.  Kept his appt as BP has been elevated as well.  Pt denies chest pain, increased sob or doe, wheezing, orthopnea, PND, increased LE swelling, palpitations, dizziness or syncope.  Lost wt from 252 to 228 with BP ok at home but not checked recently.  Does not want med change today.  Does also have reduced hearing in the ears for the past wk not clear if related to the URI, suspects wax impactions again.   Pt denies polydipsia, polyuria, or new focal neuro s/s.   Pt denies fever, wt loss, night sweats, loss of appetite, or other constitutional symptoms   recent covid testing neg x 2. Did have covid infection in august 2022.   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 since 1990s   Myasthenia gravis ([place])    Sinus drainage 02/03/2016   Wears partial dentures    upper    Past Surgical History:  Procedure Laterality Date   LASER ABLATION OF CONDYLOMAS  11/04/2009   LUMBAR SPINE SURGERY  1986   MINOR FULGERATION OF ANAL CONDYLOMA N/A 02/10/2016   Procedure: EXCISION AND FULGURATION OF ANAL CONDYLOMA;  Surgeon: Jackolyn Confer, MD;  Location: [facility];  Service: General;  Laterality: N/A;    reports that he has never smoked. He has never used smokeless tobacco. He  reports current alcohol use. He reports that he does not use drugs. family history includes Aneurysm (age of onset: 42) in his brother; Cancer in his sister; Diabetes in his mother and sister; Hypertension in his sister. Allergies  Allergen Reactions   Azithromycin Other (See Comments)    DUE TO MYASTHENIA GRAVIS   Botox [Botulinum Toxin Type A] Other (See Comments)    DUE TO MYASTHENIA GRAVIS   Gentamycin [Gentamicin] Other (See Comments)    DUE TO MYASTHENIA GRAVIS   Ketek [Telithromycin] Other (See Comments)    DUE TO MYASTHENIA GRAVIS   Levaquin [Levofloxacin In D5w] Other (See Comments)    DUE TO MYASTHENIA GRAVIS   Magnesium-Containing Compounds Other (See Comments)    DUE TO MYASTHENIA GRAVIS   Neomycin Other (See Comments)    DUE TO MYASTHENIA GRAVIS   Penicillins Other (See Comments)    DUE TO MYASTHENIA GRAVIS    Procainamide Other (See Comments)    DUE TO MYASTHENIA GRAVIS   Quinine Derivatives Other (See Comments)    DUE TO MYASTHENIA GRAVIS   Current Outpatient Medications on File Prior to Visit  Medication Sig Dispense Refill   ALPHA LIPOIC ACID PO Take by mouth.     azaTHIOprine (IMURAN) 50 MG tablet Take 200 mg by mouth daily.  azelastine (ASTELIN) 0.1 % nasal spray SPRAY TWO SPRAYS IN EACH NOSTRIL TWICE DAILY AS DIRECTED 30 mL 5   calcium-vitamin D (OSCAL-500) 500-400 MG-UNIT tablet Take 1 tablet by mouth daily. 500-600     CHROMIUM PO Take by mouth.     Cranberry-Milk Thistle (LIVER & KIDNEY CLEANSER PO) Take by mouth.     cyclobenzaprine (FLEXERIL) 5 MG tablet Take 1 tablet by mouth three times daily as needed for muscle spasm 60 tablet 0   diphenhydrAMINE (BENADRYL) 25 MG tablet Take 25 mg by mouth every 6 (six) hours as needed for sleep.      metFORMIN (GLUCOPHAGE-XR) 500 MG 24 hr tablet Take 1 tablet (500 mg total) by mouth daily with breakfast. 90 tablet 3   Misc Natural Products (HEALTHY LIVER PO) Take by mouth.     Multiple Vitamin (MULTIVITAMIN)  tablet Take 1 tablet by mouth daily.     NON FORMULARY Adult stem cell activator     NON FORMULARY PC brain and liver     olmesartan-hydrochlorothiazide (BENICAR HCT) 40-12.5 MG tablet TAKE 1 TABLET BY MOUTH ONCE DAILY . APPOINTMENT REQUIRED FOR FUTURE REFILLS 90 tablet 0   predniSONE (DELTASONE) 2.5 MG tablet      pyridostigmine (MESTINON) 60 MG tablet Take 60 mg by mouth 4 (four) times daily.     ranitidine (ZANTAC) 150 MG tablet Take 1 tablet (150 mg total) by mouth 2 (two) times daily. 180 tablet 3   RESVERATROL PO Take 1 each by mouth daily.     simethicone (GAS-X) 80 MG chewable tablet Chew 1 tablet (80 mg total) by mouth every 6 (six) hours as needed for flatulence. 30 tablet 0   UNABLE TO FIND Take by mouth.     No current facility-administered medications on file prior to visit.        ROS:  All others reviewed and negative.  Objective        PE:  BP (!) 158/80 (BP Location: Right Arm, Patient Position: Sitting, Cuff Size: Large)   Pulse 67   Temp 98.2 F (36.8 C) (Oral)   Ht 6' (1.829 m)   Wt 228 lb (103.4 kg)   SpO2 95%   BMI 30.92 kg/m                 Constitutional: Pt appears in NAD               HENT: Head: NCAT.                Right Ear: External ear normal.                 Left Ear: External ear normal.   Bilateral wax impactions resolved with irrigation, hearing improved               Eyes: . Pupils are equal, round, and reactive to light. Conjunctivae and EOM are normal               Nose: without d/c or deformity               Neck: Neck supple. Gross normal ROM               Cardiovascular: Normal rate and regular rhythm.                 Pulmonary/Chest: Effort normal and breath sounds without rales or wheezing.  Abd:  Soft, NT, ND, + BS, no organomegaly               Neurological: Pt is alert. At baseline orientation, motor grossly intact               Skin: Skin is warm. No rashes, no other new lesions, LE edema - none                Psychiatric: Pt behavior is normal without agitation   Micro: none  Cardiac tracings I have personally interpreted today:  none  Pertinent Radiological findings (summarize): none   Lab Results  Component Value Date   WBC 4.4 06/11/2020   HGB 13.9 06/11/2020   HCT 41.8 06/11/2020   PLT 250.0 06/11/2020   GLUCOSE 98 11/03/2020   CHOL 190 11/03/2020   TRIG 69.0 11/03/2020   HDL 75.90 11/03/2020   LDLCALC 100 (H) 11/03/2020   ALT 51 11/03/2020   AST 43 (H) 11/03/2020   NA 141 11/03/2020   K 3.7 11/03/2020   CL 101 11/03/2020   CREATININE 1.24 11/03/2020   BUN 15 11/03/2020   CO2 30 11/03/2020   TSH 0.84 06/11/2020   PSA 1.79 06/11/2020   HGBA1C 6.6 (H) 11/03/2020   MICROALBUR <0.7 06/11/2020   Assessment/Plan:  Makoto Sellitto is a 60 y.o. Black or African American [2] male with  has a past medical history of Anal condyloma (01/2016), Arthritis, HLD (hyperlipidemia) (05/03/2018), Hypertension, Myasthenia gravis ([place]), Sinus drainage (02/03/2016), and Wears partial dentures.  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  Wax is removed by syringing and manual debridement. Instructions for home care to prevent wax buildup are given.  Hearing improved and wax impactions resolved with irrigaiton  Essential hypertension BP Readings from Last 3 Encounters:  11/03/20 (!) 158/80  06/11/20 (!) 148/88  11/14/19 136/74   Uncontrolled, likely reactive,, pt to continue medical treatment benicar hct and cont to monitor bp  closely at home  Followup: Return in about 7 weeks (around 12/24/2020).  Cathlean Cower, MD 11/06/2020 7:12 PM [facility]

## 2020-11-04 ENCOUNTER — Encounter: Payer: Self-pay | Admitting: Internal Medicine

## 2020-11-04 LAB — HEPATIC FUNCTION PANEL
ALT: 51 U/L (ref 0–53)
AST: 43 U/L — ABNORMAL HIGH (ref 0–37)
Albumin: 4.4 g/dL (ref 3.5–5.2)
Alkaline Phosphatase: 47 U/L (ref 39–117)
Bilirubin, Direct: 0.2 mg/dL (ref 0.0–0.3)
Total Bilirubin: 0.8 mg/dL (ref 0.2–1.2)
Total Protein: 7.1 g/dL (ref 6.0–8.3)

## 2020-11-04 LAB — LIPID PANEL
Cholesterol: 190 mg/dL (ref 0–200)
HDL: 75.9 mg/dL (ref >39.00)
LDL Cholesterol: 100 mg/dL — ABNORMAL HIGH (ref 0–99)
NonHDL: 113.9
Total CHOL/HDL Ratio: 3
Triglycerides: 69 mg/dL (ref 0.0–149.0)
VLDL: 13.8 mg/dL (ref 0.0–40.0)

## 2020-11-04 LAB — BASIC METABOLIC PANEL
BUN: 15 mg/dL (ref 6–23)
CO2: 30 mEq/L (ref 19–32)
Calcium: 10.1 mg/dL (ref 8.4–10.5)
Chloride: 101 mEq/L (ref 96–112)
Creatinine, Ser: 1.24 mg/dL (ref 0.40–1.50)
GFR: 62.53 mL/min (ref >60.00)
Glucose, Bld: 98 mg/dL (ref 70–99)
Potassium: 3.7 mEq/L (ref 3.5–5.1)
Sodium: 141 mEq/L (ref 135–145)

## 2020-11-04 LAB — URINALYSIS, ROUTINE W REFLEX MICROSCOPIC
Bilirubin Urine: NEGATIVE
Hgb urine dipstick: NEGATIVE
Ketones, ur: NEGATIVE
Leukocytes,Ua: NEGATIVE
Nitrite: NEGATIVE
RBC / HPF: NONE SEEN (ref 0–?)
Specific Gravity, Urine: <=1.005 — AB (ref 1.000–1.030)
Total Protein, Urine: NEGATIVE
Urine Glucose: NEGATIVE
Urobilinogen, UA: 0.2 (ref 0.0–1.0)
WBC, UA: NONE SEEN (ref 0–?)
pH: 6.5 (ref 5.0–8.0)

## 2020-11-04 LAB — HEMOGLOBIN A1C: Hgb A1c MFr Bld: 6.6 % — ABNORMAL HIGH (ref 4.6–6.5)

## 2020-11-06 ENCOUNTER — Encounter: Payer: Self-pay | Admitting: Internal Medicine

## 2020-11-06 NOTE — Assessment & Plan Note (Signed)
Resolved, pt reassured

## 2020-11-06 NOTE — Assessment & Plan Note (Signed)
Ceruminosis is noted.  Wax is removed by syringing and manual debridement. Instructions for home care to prevent wax buildup are given.  Hearing improved and wax impactions resolved with irrigaiton

## 2020-11-06 NOTE — Assessment & Plan Note (Signed)
BP Readings from Last 3 Encounters:  11/03/20 (!) 158/80  06/11/20 (!) 148/88  11/14/19 136/74   Uncontrolled, likely reactive,, pt to continue medical treatment benicar hct and cont to monitor bp closely at home

## 2020-11-06 NOTE — Assessment & Plan Note (Signed)
Lab Results  Component Value Date   CREATININE 1.24 11/03/2020   Stable overall, cont to avoid nephrotoxins, for ua

## 2020-11-06 NOTE — Assessment & Plan Note (Signed)
Lab Results  Component Value Date   HGBA1C 6.6 (H) 11/03/2020   Stable, pt to continue current medical treatment metformin

## 2020-11-16 DIAGNOSIS — G5602 Carpal tunnel syndrome, left upper limb: Secondary | ICD-10-CM | POA: Diagnosis not present

## 2020-11-16 DIAGNOSIS — H532 Diplopia: Secondary | ICD-10-CM | POA: Diagnosis not present

## 2020-11-16 DIAGNOSIS — R29898 Other symptoms and signs involving the musculoskeletal system: Secondary | ICD-10-CM | POA: Diagnosis not present

## 2020-11-16 DIAGNOSIS — G7000 Myasthenia gravis without (acute) exacerbation: Secondary | ICD-10-CM | POA: Diagnosis not present

## 2020-11-16 DIAGNOSIS — H02402 Unspecified ptosis of left eyelid: Secondary | ICD-10-CM | POA: Diagnosis not present

## 2020-11-20 ENCOUNTER — Other Ambulatory Visit: Payer: Self-pay | Admitting: Internal Medicine

## 2020-11-20 NOTE — Telephone Encounter (Signed)
Please refill as per office routine med refill policy (all routine meds to be refilled for 3 mo or monthly (per pt preference) up to one year from last visit, then month to month grace period for 3 mo, then further med refills will have to be denied) ? ?

## 2020-12-14 DIAGNOSIS — Z20822 Contact with and (suspected) exposure to covid-19: Secondary | ICD-10-CM | POA: Diagnosis not present

## 2020-12-22 ENCOUNTER — Other Ambulatory Visit: Payer: Self-pay | Admitting: Internal Medicine

## 2020-12-22 NOTE — Telephone Encounter (Signed)
Please refill as per office routine med refill policy (all routine meds to be refilled for 3 mo or monthly (per pt preference) up to one year from last visit, then month to month grace period for 3 mo, then further med refills will have to be denied) ? ?

## 2020-12-24 ENCOUNTER — Ambulatory Visit: Payer: Medicare HMO | Admitting: Internal Medicine

## 2021-01-03 DIAGNOSIS — K760 Fatty (change of) liver, not elsewhere classified: Secondary | ICD-10-CM | POA: Diagnosis not present

## 2021-01-21 DIAGNOSIS — Z20822 Contact with and (suspected) exposure to covid-19: Secondary | ICD-10-CM | POA: Diagnosis not present

## 2021-01-24 ENCOUNTER — Other Ambulatory Visit: Payer: Self-pay | Admitting: Internal Medicine

## 2021-01-24 NOTE — Telephone Encounter (Signed)
Please refill as per office routine med refill policy (all routine meds to be refilled for 3 mo or monthly (per pt preference) up to one year from last visit, then month to month grace period for 3 mo, then further med refills will have to be denied) ? ?

## 2021-01-27 ENCOUNTER — Ambulatory Visit: Payer: Medicare HMO | Admitting: Internal Medicine

## 2021-02-01 ENCOUNTER — Other Ambulatory Visit: Payer: Self-pay

## 2021-02-01 ENCOUNTER — Encounter: Payer: Self-pay | Admitting: Internal Medicine

## 2021-02-01 ENCOUNTER — Ambulatory Visit (INDEPENDENT_AMBULATORY_CARE_PROVIDER_SITE_OTHER): Payer: Medicare HMO | Admitting: Internal Medicine

## 2021-02-01 VITALS — BP 136/78 | HR 79 | Temp 98.2°F | Ht 72.0 in | Wt 237.0 lb

## 2021-02-01 DIAGNOSIS — E538 Deficiency of other specified B group vitamins: Secondary | ICD-10-CM | POA: Diagnosis not present

## 2021-02-01 DIAGNOSIS — F411 Generalized anxiety disorder: Secondary | ICD-10-CM

## 2021-02-01 DIAGNOSIS — E119 Type 2 diabetes mellitus without complications: Secondary | ICD-10-CM

## 2021-02-01 DIAGNOSIS — H6122 Impacted cerumen, left ear: Secondary | ICD-10-CM | POA: Diagnosis not present

## 2021-02-01 DIAGNOSIS — E7800 Pure hypercholesterolemia, unspecified: Secondary | ICD-10-CM

## 2021-02-01 DIAGNOSIS — H9192 Unspecified hearing loss, left ear: Secondary | ICD-10-CM

## 2021-02-01 DIAGNOSIS — Z0000 Encounter for general adult medical examination without abnormal findings: Secondary | ICD-10-CM

## 2021-02-01 DIAGNOSIS — H6123 Impacted cerumen, bilateral: Secondary | ICD-10-CM

## 2021-02-01 DIAGNOSIS — E559 Vitamin D deficiency, unspecified: Secondary | ICD-10-CM

## 2021-02-01 MED ORDER — CYCLOBENZAPRINE HCL 5 MG PO TABS: 5.0000 mg | ORAL_TABLET | Freq: Three times a day (TID) | ORAL | 5 refills | Status: DC | PRN

## 2021-02-01 MED ORDER — OLMESARTAN MEDOXOMIL-HCTZ 40-12.5 MG PO TABS: ORAL_TABLET | ORAL | 3 refills | Status: DC

## 2021-02-01 NOTE — Patient Instructions (Signed)
Your left ear was irrigated of wax today  Please continue all other medications as before, and refills have been done if requested the benicar HCT and flexeril muscle relaxer  Please have the pharmacy call with any other refills you may need.  Please continue your efforts at being more active, low cholesterol diet, and weight control.  Please keep your appointments with your specialists as you may have planned  Please make an Appointment to return in 6 months, or sooner if needed, also with Lab Appointment for testing done 3-5 days before at the [facility] (so this is for TWO appointments - please see the scheduling desk as you leave)  Due to the ongoing Covid 19 pandemic, our lab now requires an appointment for any labs done at our office.  If you need labs done and do not have an appointment, please call our office ahead of time to schedule before presenting to the lab for your testing.

## 2021-02-01 NOTE — Progress Notes (Signed)
Patient consent obtained. Irrigation with water and peroxide performed. Full view of tympanic membranes after procedure.  Patient tolerated procedure well.   

## 2021-02-01 NOTE — Assessment & Plan Note (Signed)
Overall stable, declines need for change in tx or referral 

## 2021-02-01 NOTE — Assessment & Plan Note (Signed)
Lab Results  Component Value Date   LDLCALC 100 (H) 11/03/2020   Uncontrolled, goal ldl < 70, pt to continue current DM low chol diet, declines statin

## 2021-02-01 NOTE — Progress Notes (Signed)
Patient ID: Miguel Powell, male   DOB: [birth year], 60 y.o.   MRN: [id number]        Chief Complaint: follow up left hearing loss hld, hyperglycemia       HPI:  Miguel Powell is a 60 y.o. male here with c/o acute onset left ear hearing loss for 1 wk without pain, HA, ear discharge, fever, ST or sinus symptoms.  Trying to follow lower chol diet.  Pt denies chest pain, increased sob or doe, wheezing, orthopnea, PND, increased LE swelling, palpitations, dizziness or syncope.   Pt denies polydipsia, polyuria, or new focal neuro s/s.   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 since 1990s   Myasthenia gravis ([place])    Sinus drainage 02/03/2016   Wears partial dentures    upper    Past Surgical History:  Procedure Laterality Date   LASER ABLATION OF CONDYLOMAS  11/04/2009   LUMBAR [place] OF ANAL CONDYLOMA N/A 02/10/2016   Procedure: EXCISION AND FULGURATION OF ANAL CONDYLOMA;  Surgeon: Jackolyn Confer, MD;  Location: [facility];  Service: General;  Laterality: N/A;    reports that he has never smoked. He has never used smokeless tobacco. He reports current alcohol use. He reports that he does not use drugs. family history includes Aneurysm (age of onset: 32) in his brother; Cancer in his sister; Diabetes in his mother and sister; Hypertension in his sister. Allergies  Allergen Reactions    Azithromycin Other (See Comments)    DUE TO MYASTHENIA GRAVIS   Botox [Botulinum Toxin Type A] Other (See Comments)    DUE TO MYASTHENIA GRAVIS   Gentamycin [Gentamicin] Other (See Comments)    DUE TO MYASTHENIA GRAVIS   Ketek [Telithromycin] Other (See Comments)    DUE TO MYASTHENIA GRAVIS   Levaquin [Levofloxacin In D5w] Other (See Comments)    DUE TO MYASTHENIA GRAVIS   Magnesium-Containing Compounds Other (See Comments)    DUE TO MYASTHENIA GRAVIS   Neomycin Other (See Comments)    DUE TO MYASTHENIA GRAVIS   Penicillins Other (See Comments)    DUE TO MYASTHENIA GRAVIS    Procainamide Other (See Comments)    DUE TO MYASTHENIA GRAVIS   Quinine Derivatives Other (See Comments)    DUE TO MYASTHENIA GRAVIS   Current Outpatient Medications on File Prior to Visit  Medication Sig Dispense Refill   ALPHA LIPOIC ACID PO Take by mouth.     azaTHIOprine (IMURAN) 50 MG tablet Take 200 mg by mouth daily.     azelastine (ASTELIN) 0.1 % nasal spray SPRAY TWO SPRAYS IN EACH NOSTRIL TWICE DAILY AS DIRECTED 30 mL 5   calcium-vitamin D (OSCAL-500) 500-400 MG-UNIT tablet Take 1 tablet by mouth daily. 500-600     CHROMIUM PO Take by mouth.  Cranberry-Milk Thistle (LIVER & KIDNEY CLEANSER PO) Take by mouth.     diphenhydrAMINE (BENADRYL) 25 MG tablet Take 25 mg by mouth every 6 (six) hours as needed for sleep.      metFORMIN (GLUCOPHAGE-XR) 500 MG 24 hr tablet Take 1 tablet by mouth once daily with breakfast 90 tablet 1   Misc Natural Products (HEALTHY LIVER PO) Take by mouth.     Multiple Vitamin (MULTIVITAMIN) tablet Take 1 tablet by mouth daily.     NON FORMULARY Adult stem cell activator     NON FORMULARY PC brain and liver     predniSONE (DELTASONE) 2.5 MG tablet      pyridostigmine (MESTINON) 60 MG tablet Take 60 mg by mouth 4 (four) times daily.     ranitidine (ZANTAC) 150 MG tablet Take 1 tablet (150 mg total) by mouth 2 (two) times daily. 180 tablet 3   RESVERATROL PO Take 1 each  by mouth daily.     simethicone (GAS-X) 80 MG chewable tablet Chew 1 tablet (80 mg total) by mouth every 6 (six) hours as needed for flatulence. 30 tablet 0   UNABLE TO FIND Take by mouth.     No current facility-administered medications on file prior to visit.        ROS:  All others reviewed and negative.  Objective        PE:  BP 136/78 (BP Location: Right Arm, Patient Position: Sitting, Cuff Size: Large)    Pulse 79    Temp 98.2 F (36.8 C) (Oral)    Ht 6' (1.829 m)    Wt 237 lb (107.5 kg)    SpO2 97%    BMI 32.14 kg/m                 Constitutional: Pt appears in NAD               HENT: Head: NCAT.                Right Ear: External ear normal.                 Left Ear: External ear normal. Left canal wax impaction resolved with irrigation and hearing improved               Eyes: . Pupils are equal, round, and reactive to light. Conjunctivae and EOM are normal               Nose: without d/c or deformity               Neck: Neck supple. Gross normal ROM               Cardiovascular: Normal rate and regular rhythm.                 Pulmonary/Chest: Effort normal and breath sounds without rales or wheezing.                               Neurological: Pt is alert. At baseline orientation, motor grossly intact               Skin: Skin is warm. No rashes, no other new lesions, LE edema - none               Psychiatric: Pt behavior is normal without agitation   Micro: none  Cardiac tracings I have personally interpreted today:  none  Pertinent Radiological  findings (summarize): none   Lab Results  Component Value Date   WBC 4.4 06/11/2020   HGB 13.9 06/11/2020   HCT 41.8 06/11/2020   PLT 250.0 06/11/2020   GLUCOSE 98 11/03/2020   CHOL 190 11/03/2020   TRIG 69.0 11/03/2020   HDL 75.90 11/03/2020   LDLCALC 100 (H) 11/03/2020   ALT 51 11/03/2020   AST 43 (H) 11/03/2020   NA 141 11/03/2020   K 3.7 11/03/2020   CL 101 11/03/2020   CREATININE 1.24 11/03/2020   BUN 15  11/03/2020   CO2 30 11/03/2020   TSH 0.84 06/11/2020   PSA 1.79 06/11/2020   HGBA1C 6.6 (H) 11/03/2020   MICROALBUR <0.7 06/11/2020   Assessment/Plan:  Elery Cadenhead is a 60 y.o. 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  Anxiety state Overall stable, declines need for change in tx or referral  Vitamin D deficiency Last vitamin D Lab Results  Component Value Date   VD25OH 75.99 06/11/2020   Stable, cont oral replacement   HLD (hyperlipidemia) Lab Results  Component Value Date   LDLCALC 100 (H) 11/03/2020   Uncontrolled, goal ldl < 70, pt to continue current DM low chol diet, declines statin   Left ear hearing loss Hearing improved after ear canal irrigation,   Ceruminosis is noted.  Wax is removed by syringing and manual debridement. Instructions for home care to prevent wax buildup are given.  Followup: Return in about 6 months (around 08/02/2021).  Cathlean Cower, MD 02/01/2021 8:38 PM [facility]

## 2021-02-01 NOTE — Assessment & Plan Note (Signed)
Hearing improved after ear canal irrigation,   Ceruminosis is noted.  Wax is removed by syringing and manual debridement. Instructions for home care to prevent wax buildup are given.

## 2021-02-01 NOTE — Assessment & Plan Note (Signed)
Last vitamin D Lab Results  Component Value Date   VD25OH 75.99 06/11/2020   Stable, cont oral replacement

## 2021-02-10 DIAGNOSIS — Z20822 Contact with and (suspected) exposure to covid-19: Secondary | ICD-10-CM | POA: Diagnosis not present

## 2021-02-10 DIAGNOSIS — Z03818 Encounter for observation for suspected exposure to other biological agents ruled out: Secondary | ICD-10-CM | POA: Diagnosis not present

## 2021-02-24 DIAGNOSIS — G7000 Myasthenia gravis without (acute) exacerbation: Secondary | ICD-10-CM | POA: Diagnosis not present

## 2021-02-24 DIAGNOSIS — Z79899 Other long term (current) drug therapy: Secondary | ICD-10-CM | POA: Diagnosis not present

## 2021-03-30 ENCOUNTER — Ambulatory Visit (INDEPENDENT_AMBULATORY_CARE_PROVIDER_SITE_OTHER): Payer: Medicare HMO | Admitting: Internal Medicine

## 2021-03-30 ENCOUNTER — Other Ambulatory Visit: Payer: Self-pay

## 2021-03-30 ENCOUNTER — Encounter: Payer: Self-pay | Admitting: Internal Medicine

## 2021-03-30 DIAGNOSIS — Z794 Long term (current) use of insulin: Secondary | ICD-10-CM

## 2021-03-30 DIAGNOSIS — R051 Acute cough: Secondary | ICD-10-CM

## 2021-03-30 DIAGNOSIS — I10 Essential (primary) hypertension: Secondary | ICD-10-CM | POA: Diagnosis not present

## 2021-03-30 DIAGNOSIS — E0822 Diabetes mellitus due to underlying condition with diabetic chronic kidney disease: Secondary | ICD-10-CM | POA: Diagnosis not present

## 2021-03-30 DIAGNOSIS — N182 Chronic kidney disease, stage 2 (mild): Secondary | ICD-10-CM

## 2021-03-30 MED ORDER — DOXYCYCLINE HYCLATE 100 MG PO TABS: 100.0000 mg | ORAL_TABLET | Freq: Two times a day (BID) | ORAL | 0 refills | Status: DC

## 2021-03-30 MED ORDER — HYDROCODONE BIT-HOMATROP MBR 5-1.5 MG/5ML PO SOLN: 5.0000 mL | Freq: Four times a day (QID) | ORAL | 0 refills | Status: AC | PRN

## 2021-03-30 NOTE — Patient Instructions (Signed)
Please take all new medication as prescribed - the antibiotic, and cough medicine  Please continue all other medications as before, and refills have been done if requested.  Please have the pharmacy call with any other refills you may need.  Please continue your efforts at being more active, low cholesterol diet, and weight control.  Please keep your appointments with your specialists as you may have planned

## 2021-04-03 ENCOUNTER — Encounter: Payer: Self-pay | Admitting: Internal Medicine

## 2021-04-03 NOTE — Assessment & Plan Note (Signed)
Lab Results  Component Value Date   HGBA1C 6.6 (H) 11/03/2020   Stable, pt to continue current medical treatment metformin

## 2021-04-03 NOTE — Assessment & Plan Note (Signed)
C/w bronchitis vs pna, declines cxr, for antibiotic, cough med prn, and cxr for any worsening,  to f/u any worsening symptoms or concerns

## 2021-04-03 NOTE — Assessment & Plan Note (Signed)
Lab Results  Component Value Date   CREATININE 1.24 11/03/2020   Stable overall, continue metformin, cont to avoid nephrotoxins

## 2021-04-03 NOTE — Progress Notes (Signed)
Patient ID: Miguel Powell, male   DOB: Apr 22, 1960, 61 y.o.   MRN: [id number]        Chief Complaint: follow up cough       HPI:  Miguel Powell is a 61 y.o. male Here with acute onset mild to mod 2-3 days ST, HA, general weakness and malaise, with prod cough greenish sputum, but Pt denies chest pain, increased sob or doe, wheezing, orthopnea, PND, increased LE swelling, palpitations, dizziness or syncope.  Pt was neg at home COVID on feb 13.   Pt denies polydipsia, polyuria, or new focal neuro s/s.   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 since 1990s   Myasthenia gravis ([place])    Sinus drainage 02/03/2016   Wears partial dentures    upper    Past Surgical History:  Procedure Laterality Date   LASER ABLATION OF CONDYLOMAS  11/04/2009   LUMBAR [place] CONDYLOMA N/A 02/10/2016   Procedure: EXCISION AND FULGURATION OF ANAL CONDYLOMA;  Surgeon: Jackolyn Confer, MD;  Location: [facility];  Service: General;  Laterality: N/A;    reports that he has never smoked. He has never used smokeless tobacco. He reports current alcohol use. He reports that he does not use drugs. family history includes Aneurysm (age of onset: 5) in his brother; Cancer in his sister; Diabetes in his mother and sister; Hypertension in his sister. Allergies  Allergen Reactions   Azithromycin Other (See Comments)    DUE TO MYASTHENIA GRAVIS   Botox [Botulinum Toxin Type A] Other (See Comments)     DUE TO MYASTHENIA GRAVIS   Gentamycin [Gentamicin] Other (See Comments)    DUE TO MYASTHENIA GRAVIS   Ketek [Telithromycin] Other (See Comments)    DUE TO MYASTHENIA GRAVIS   Levaquin [Levofloxacin In D5w] Other (See Comments)    DUE TO MYASTHENIA GRAVIS   Magnesium-Containing Compounds Other (See Comments)    DUE TO MYASTHENIA GRAVIS   Neomycin Other (See Comments)    DUE TO MYASTHENIA GRAVIS   Penicillins Other (See Comments)    DUE TO MYASTHENIA GRAVIS    Procainamide Other (See Comments)    DUE TO MYASTHENIA GRAVIS   Quinine Derivatives Other (See Comments)    DUE TO MYASTHENIA GRAVIS   Current Outpatient Medications on File Prior to Visit  Medication Sig Dispense Refill   ALPHA LIPOIC ACID PO Take by mouth.     azaTHIOprine (IMURAN) 50 MG tablet Take 200 mg by mouth daily.     azelastine (ASTELIN) 0.1 % nasal spray SPRAY TWO SPRAYS IN EACH NOSTRIL TWICE DAILY AS DIRECTED 30 mL 5   calcium-vitamin D (OSCAL-500) 500-400 MG-UNIT tablet Take 1 tablet by mouth daily. 500-600     CHROMIUM PO Take by mouth.     Cranberry-Milk Thistle (LIVER & KIDNEY CLEANSER PO) Take by mouth.  cyclobenzaprine (FLEXERIL) 5 MG tablet Take 1 tablet (5 mg total) by mouth 3 (three) times daily as needed. for muscle spams 60 tablet 5   diphenhydrAMINE (BENADRYL) 25 MG tablet Take 25 mg by mouth every 6 (six) hours as needed for sleep.      metFORMIN (GLUCOPHAGE-XR) 500 MG 24 hr tablet Take 1 tablet by mouth once daily with breakfast 90 tablet 1   Misc Natural Products (HEALTHY LIVER PO) Take by mouth.     Multiple Vitamin (MULTIVITAMIN) tablet Take 1 tablet by mouth daily.     NON FORMULARY Adult stem cell activator     NON FORMULARY PC brain and liver     olmesartan-hydrochlorothiazide (BENICAR HCT) 40-12.5 MG tablet TAKE 1 TABLET BY MOUTH ONCE DAILY . APPOINTMENT REQUIRED FOR FUTURE REFILLS 90 tablet 3   predniSONE (DELTASONE) 2.5 MG tablet      pyridostigmine (MESTINON) 60 MG tablet Take 60 mg  by mouth 4 (four) times daily.     ranitidine (ZANTAC) 150 MG tablet Take 1 tablet (150 mg total) by mouth 2 (two) times daily. 180 tablet 3   RESVERATROL PO Take 1 each by mouth daily.     simethicone (GAS-X) 80 MG chewable tablet Chew 1 tablet (80 mg total) by mouth every 6 (six) hours as needed for flatulence. 30 tablet 0   UNABLE TO FIND Take by mouth.     No current facility-administered medications on file prior to visit.        ROS:  All others reviewed and negative.  Objective        PE:  BP (!) 148/80 (BP Location: Right Arm, Patient Position: Sitting, Cuff Size: Large)    Pulse 80    Temp 98.3 F (36.8 C) (Oral)    Ht 6' (1.829 m)    Wt 238 lb (108 kg)    SpO2 95%    BMI 32.28 kg/m                 Constitutional: Pt appears in NAD               HENT: Head: NCAT.                Right Ear: External ear normal.                 Left Ear: External ear normal. Bilat tm's with mild erythema.  Max sinus areas non tender.  Pharynx with mild erythema, no exudate               Eyes: . Pupils are equal, round, and reactive to light. Conjunctivae and EOM are normal               Nose: without d/c or deformity               Neck: Neck supple. Gross normal ROM               Cardiovascular: Normal rate and regular rhythm.                 Pulmonary/Chest: Effort normal and breath sounds without rales or wheezing.                Abd:  Soft, NT, ND, + BS, no organomegaly               Neurological: Pt is alert. At baseline orientation, motor grossly intact  Skin: Skin is warm. No rashes, no other new lesions, LE edema - none               Psychiatric: Pt behavior is normal without agitation   Micro: none  Cardiac tracings I have personally interpreted today:  none  Pertinent Radiological findings (summarize): none   Lab Results  Component Value Date   WBC 4.4 06/11/2020   HGB 13.9 06/11/2020   HCT 41.8 06/11/2020   PLT 250.0 06/11/2020   GLUCOSE 98 11/03/2020   CHOL 190  11/03/2020   TRIG 69.0 11/03/2020   HDL 75.90 11/03/2020   LDLCALC 100 (H) 11/03/2020   ALT 51 11/03/2020   AST 43 (H) 11/03/2020   NA 141 11/03/2020   K 3.7 11/03/2020   CL 101 11/03/2020   CREATININE 1.24 11/03/2020   BUN 15 11/03/2020   CO2 30 11/03/2020   TSH 0.84 06/11/2020   PSA 1.79 06/11/2020   HGBA1C 6.6 (H) 11/03/2020   MICROALBUR <0.7 06/11/2020   Assessment/Plan:  Baldomero Mirarchi is a 61 y.o. Black or African American [2] male with  has a past medical history of Anal condyloma (01/2016), Arthritis, HLD (hyperlipidemia) (05/03/2018), Hypertension, Myasthenia gravis ([place]), Sinus drainage (02/03/2016), and Wears partial dentures.  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  Cathlean Cower, MD 04/03/2021 3:20 PM [facility]

## 2021-04-03 NOTE — Assessment & Plan Note (Signed)
BP Readings from Last 3 Encounters:  03/30/21 (!) 148/80  02/01/21 136/78  11/03/20 (!) 158/80   Uncontrolled, likely reactive, pt to continue medical treatment benicar hct

## 2021-04-21 ENCOUNTER — Telehealth: Payer: Self-pay | Admitting: Internal Medicine

## 2021-04-21 DIAGNOSIS — I493 Ventricular premature depolarization: Secondary | ICD-10-CM | POA: Diagnosis not present

## 2021-04-21 DIAGNOSIS — R110 Nausea: Secondary | ICD-10-CM | POA: Diagnosis not present

## 2021-04-21 DIAGNOSIS — I10 Essential (primary) hypertension: Secondary | ICD-10-CM | POA: Diagnosis not present

## 2021-04-21 DIAGNOSIS — R519 Headache, unspecified: Secondary | ICD-10-CM | POA: Diagnosis not present

## 2021-04-21 DIAGNOSIS — G7000 Myasthenia gravis without (acute) exacerbation: Secondary | ICD-10-CM | POA: Diagnosis not present

## 2021-04-21 DIAGNOSIS — Z20822 Contact with and (suspected) exposure to covid-19: Secondary | ICD-10-CM | POA: Diagnosis not present

## 2021-04-21 NOTE — Telephone Encounter (Signed)
Connected to [facility] 3.8.2023. ? ?Caller states his BP has been elevated for the past couple days 191/120. Called earlier to see PCP but was unable to be seen. Denies headache just a slight tension. Takes olmesartan once in the morning. ? ?Advised to go to ED. ?

## 2021-04-26 ENCOUNTER — Ambulatory Visit (INDEPENDENT_AMBULATORY_CARE_PROVIDER_SITE_OTHER): Payer: Medicare HMO | Admitting: Internal Medicine

## 2021-04-26 ENCOUNTER — Encounter: Payer: Self-pay | Admitting: Internal Medicine

## 2021-04-26 ENCOUNTER — Other Ambulatory Visit: Payer: Self-pay

## 2021-04-26 VITALS — BP 152/92 | HR 76 | Ht 72.0 in | Wt 228.0 lb

## 2021-04-26 DIAGNOSIS — Z0000 Encounter for general adult medical examination without abnormal findings: Secondary | ICD-10-CM

## 2021-04-26 DIAGNOSIS — E119 Type 2 diabetes mellitus without complications: Secondary | ICD-10-CM

## 2021-04-26 DIAGNOSIS — E559 Vitamin D deficiency, unspecified: Secondary | ICD-10-CM | POA: Diagnosis not present

## 2021-04-26 DIAGNOSIS — Z0001 Encounter for general adult medical examination with abnormal findings: Secondary | ICD-10-CM | POA: Diagnosis not present

## 2021-04-26 DIAGNOSIS — N182 Chronic kidney disease, stage 2 (mild): Secondary | ICD-10-CM

## 2021-04-26 DIAGNOSIS — Z794 Long term (current) use of insulin: Secondary | ICD-10-CM | POA: Diagnosis not present

## 2021-04-26 DIAGNOSIS — E538 Deficiency of other specified B group vitamins: Secondary | ICD-10-CM | POA: Diagnosis not present

## 2021-04-26 DIAGNOSIS — I10 Essential (primary) hypertension: Secondary | ICD-10-CM | POA: Diagnosis not present

## 2021-04-26 DIAGNOSIS — E0822 Diabetes mellitus due to underlying condition with diabetic chronic kidney disease: Secondary | ICD-10-CM | POA: Diagnosis not present

## 2021-04-26 DIAGNOSIS — H538 Other visual disturbances: Secondary | ICD-10-CM

## 2021-04-26 LAB — HEPATIC FUNCTION PANEL
ALT: 58 U/L — ABNORMAL HIGH (ref 0–53)
AST: 53 U/L — ABNORMAL HIGH (ref 0–37)
Albumin: 4.6 g/dL (ref 3.5–5.2)
Alkaline Phosphatase: 55 U/L (ref 39–117)
Bilirubin, Direct: 0.2 mg/dL (ref 0.0–0.3)
Total Bilirubin: 1 mg/dL (ref 0.2–1.2)
Total Protein: 6.9 g/dL (ref 6.0–8.3)

## 2021-04-26 LAB — PSA: PSA: 1.66 ng/mL (ref 0.10–4.00)

## 2021-04-26 LAB — CBC WITH DIFFERENTIAL/PLATELET
Basophils Absolute: 0 10*3/uL (ref 0.0–0.1)
Basophils Relative: 0.9 % (ref 0.0–3.0)
Eosinophils Absolute: 0.1 10*3/uL (ref 0.0–0.7)
Eosinophils Relative: 1.3 % (ref 0.0–5.0)
HCT: 44.6 % (ref 39.0–52.0)
Hemoglobin: 15 g/dL (ref 13.0–17.0)
Lymphocytes Relative: 49.1 % — ABNORMAL HIGH (ref 12.0–46.0)
Lymphs Abs: 2.1 10*3/uL (ref 0.7–4.0)
MCHC: 33.5 g/dL (ref 30.0–36.0)
MCV: 96.2 fl (ref 78.0–100.0)
Monocytes Absolute: 0.4 10*3/uL (ref 0.1–1.0)
Monocytes Relative: 9.2 % (ref 3.0–12.0)
Neutro Abs: 1.7 10*3/uL (ref 1.4–7.7)
Neutrophils Relative %: 39.5 % — ABNORMAL LOW (ref 43.0–77.0)
Platelets: 259 10*3/uL (ref 150.0–400.0)
RBC: 4.64 Mil/uL (ref 4.22–5.81)
RDW: 14.4 % (ref 11.5–15.5)
WBC: 4.3 10*3/uL (ref 4.0–10.5)

## 2021-04-26 LAB — BASIC METABOLIC PANEL
BUN: 13 mg/dL (ref 6–23)
CO2: 31 mEq/L (ref 19–32)
Calcium: 10.1 mg/dL (ref 8.4–10.5)
Chloride: 99 mEq/L (ref 96–112)
Creatinine, Ser: 1.17 mg/dL (ref 0.40–1.50)
GFR: 66.82 mL/min (ref >60.00)
Glucose, Bld: 105 mg/dL — ABNORMAL HIGH (ref 70–99)
Potassium: 3.4 mEq/L — ABNORMAL LOW (ref 3.5–5.1)
Sodium: 139 mEq/L (ref 135–145)

## 2021-04-26 LAB — HEMOGLOBIN A1C: Hgb A1c MFr Bld: 6.7 % — ABNORMAL HIGH (ref 4.6–6.5)

## 2021-04-26 LAB — VITAMIN B12: Vitamin B-12: 635 pg/mL (ref 211–911)

## 2021-04-26 LAB — VITAMIN D 25 HYDROXY (VIT D DEFICIENCY, FRACTURES): VITD: 57.73 ng/mL (ref 30.00–100.00)

## 2021-04-26 LAB — LIPID PANEL
Cholesterol: 171 mg/dL (ref 0–200)
HDL: 57.1 mg/dL (ref >39.00)
LDL Cholesterol: 94 mg/dL (ref 0–99)
NonHDL: 114.02
Total CHOL/HDL Ratio: 3
Triglycerides: 98 mg/dL (ref 0.0–149.0)
VLDL: 19.6 mg/dL (ref 0.0–40.0)

## 2021-04-26 LAB — TSH: TSH: 0.81 u[IU]/mL (ref 0.35–5.50)

## 2021-04-26 MED ORDER — AMLODIPINE BESYLATE 5 MG PO TABS: 5.0000 mg | ORAL_TABLET | Freq: Every day | ORAL | 3 refills | Status: DC

## 2021-04-26 NOTE — Assessment & Plan Note (Signed)
Last vitamin D ?Lab Results  ?Component Value Date  ? VD25OH 75.99 06/11/2020  ? ?Stable, cont oral replacement ? ?

## 2021-04-26 NOTE — Patient Instructions (Signed)
Please take all new medication as prescribed - the amlodipine 5 mg per day ? ?Please continue all other medications as before, and refills have been done if requested. ? ?Please have the pharmacy call with any other refills you may need. ? ?Please continue your efforts at being more active, low cholesterol diet, and weight control. ? ?You are otherwise up to date with prevention measures today. ? ?Please keep your appointments with your specialists as you may have planned ? ?You will be contacted regarding the referral for: [facility] ? ?Please go to the LAB at the blood drawing area for the tests to be done ? ?You will be contacted by phone if any changes need to be made immediately.  Otherwise, you will receive a letter about your results with an explanation, but please check with MyChart first. ? ?Please remember to sign up for MyChart if you have not done so, as this will be important to you in the future with finding out test results, communicating by private email, and scheduling acute appointments online when needed. ? ?We'll see you back then in June - thanks ?

## 2021-04-26 NOTE — Progress Notes (Signed)
Patient ID: Miguel Powell, male   DOB: January 31, 1961, 61 y.o.   MRN: [id number]         Chief Complaint:: wellness exam and htn       HPI:  Miguel Powell is a 61 y.o. male here for wellness exam; due for eye exam at [facility], decliens covid booster, flu shot, shingrix, o/w up to date                        Also Pt denies chest pain, increased sob or doe, wheezing, orthopnea, PND, increased LE swelling, palpitations, dizziness or syncope.   Pt denies polydipsia, polyuria, or new focal neuro s/s.   Pt denies fever, wt loss, night sweats, loss of appetite, or other constitutional symptoms  BP has been mildly elevated several times at home.  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 since 1990s   Myasthenia gravis (HCC)    Sinus drainage 02/03/2016   Wears partial dentures    upper    Past Surgical History:  Procedure Laterality Date   LASER ABLATION OF CONDYLOMAS  11/04/2009   LUMBAR SPINE SURGERY  1986   MINOR FULGERATION OF ANAL CONDYLOMA N/A 02/10/2016   Procedure: EXCISION AND FULGURATION OF ANAL CONDYLOMA;  Surgeon: Avel Peace, MD;  Location: [facility];  Service: General;  Laterality: N/A;    reports that he has never smoked. He has never used smokeless tobacco. He  reports current alcohol use. He reports that he does not use drugs. family history includes Aneurysm (age of onset: 25) in his brother; Cancer in his sister; Diabetes in his mother and sister; Hypertension in his sister. Allergies  Allergen Reactions   Azithromycin Other (See Comments)    DUE TO MYASTHENIA GRAVIS   Botox [Botulinum Toxin Type A] Other (See Comments)    DUE TO MYASTHENIA GRAVIS   Gentamycin [Gentamicin] Other (See Comments)    DUE TO MYASTHENIA GRAVIS   Ketek [Telithromycin] Other (See Comments)    DUE TO MYASTHENIA GRAVIS   Levaquin [Levofloxacin In D5w] Other (See Comments)    DUE TO MYASTHENIA GRAVIS   Magnesium-Containing Compounds Other (See Comments)    DUE TO MYASTHENIA GRAVIS   Neomycin Other (See Comments)    DUE TO MYASTHENIA GRAVIS   Penicillins Other (See Comments)    DUE TO MYASTHENIA GRAVIS    Procainamide Other (See Comments)    DUE TO MYASTHENIA GRAVIS   Quinine Derivatives Other (See Comments)    DUE TO MYASTHENIA GRAVIS   Current Outpatient Medications on File Prior to Visit  Medication Sig Dispense Refill   ALPHA LIPOIC ACID PO Take by mouth.     azaTHIOprine (IMURAN) 50 MG tablet Take 200  mg by mouth daily.     azelastine (ASTELIN) 0.1 % nasal spray SPRAY TWO SPRAYS IN EACH NOSTRIL TWICE DAILY AS DIRECTED 30 mL 5   calcium-vitamin D (OSCAL-500) 500-400 MG-UNIT tablet Take 1 tablet by mouth daily. 500-600     CHROMIUM PO Take by mouth.     Cranberry-Milk Thistle (LIVER & KIDNEY CLEANSER PO) Take by mouth.     cyclobenzaprine (FLEXERIL) 5 MG tablet Take 1 tablet (5 mg total) by mouth 3 (three) times daily as needed. for muscle spams 60 tablet 5   diphenhydrAMINE (BENADRYL) 25 MG tablet Take 25 mg by mouth every 6 (six) hours as needed for sleep.      doxycycline (VIBRA-TABS) 100 MG tablet Take 1 tablet (100 mg total) by mouth 2 (two) times daily. 20 tablet 0   metFORMIN (GLUCOPHAGE-XR) 500 MG 24 hr tablet Take 1 tablet by mouth once daily with  breakfast 90 tablet 1   Misc Natural Products (HEALTHY LIVER PO) Take by mouth.     Multiple Vitamin (MULTIVITAMIN) tablet Take 1 tablet by mouth daily.     NON FORMULARY Adult stem cell activator     NON FORMULARY PC brain and liver     olmesartan-hydrochlorothiazide (BENICAR HCT) 40-25 MG tablet Take 1 tablet by mouth daily.     predniSONE (DELTASONE) 2.5 MG tablet      pyridostigmine (MESTINON) 60 MG tablet Take 60 mg by mouth 4 (four) times daily.     ranitidine (ZANTAC) 150 MG tablet Take 1 tablet (150 mg total) by mouth 2 (two) times daily. 180 tablet 3   RESVERATROL PO Take 1 each by mouth daily.     simethicone (GAS-X) 80 MG chewable tablet Chew 1 tablet (80 mg total) by mouth every 6 (six) hours as needed for flatulence. 30 tablet 0   UNABLE TO FIND Take by mouth.     No current facility-administered medications on file prior to visit.        ROS:  All others reviewed and negative.  Objective        PE:  BP (!) 152/92 (BP Location: Right Arm, Patient Position: Sitting, Cuff Size: Large)   Pulse 76   Ht 6' (1.829 m)   Wt 228 lb (103.4 kg)   SpO2 100%   BMI 30.92 kg/m                 Constitutional: Pt appears in NAD               HENT: Head: NCAT.                Right Ear: External ear normal.                 Left Ear: External ear normal.                Eyes: . Pupils are equal, round, and reactive to light. Conjunctivae and EOM are normal               Nose: without d/c or deformity               Neck: Neck supple. Gross normal ROM               Cardiovascular: Normal rate and regular rhythm.                 Pulmonary/Chest: Effort normal and breath sounds without rales or wheezing.  Abd:  Soft, NT, ND, + BS, no organomegaly               Neurological: Pt is alert. At baseline orientation, motor grossly intact               Skin: Skin is warm. No rashes, no other new lesions, LE edema - none               Psychiatric: Pt behavior is normal without  agitation   Micro: none  Cardiac tracings I have personally interpreted today:  none  Pertinent Radiological findings (summarize): none   Lab Results  Component Value Date   WBC 4.3 04/26/2021   HGB 15.0 04/26/2021   HCT 44.6 04/26/2021   PLT 259.0 04/26/2021   GLUCOSE 105 (H) 04/26/2021   CHOL 171 04/26/2021   TRIG 98.0 04/26/2021   HDL 57.10 04/26/2021   LDLCALC 94 04/26/2021   ALT 58 (H) 04/26/2021   AST 53 (H) 04/26/2021   NA 139 04/26/2021   K 3.4 (L) 04/26/2021   CL 99 04/26/2021   CREATININE 1.17 04/26/2021   BUN 13 04/26/2021   CO2 31 04/26/2021   TSH 0.81 04/26/2021   PSA 1.66 04/26/2021   HGBA1C 6.7 (H) 04/26/2021   MICROALBUR 1.0 04/26/2021   Assessment/Plan:  Joeseph Glad is a 61 y.o. Black or African American [2] male with  has a past medical history of Anal condyloma (01/2016), Arthritis, HLD (hyperlipidemia) (05/03/2018), Hypertension, Myasthenia gravis (HCC), Sinus drainage (02/03/2016), and Wears partial dentures.  Vitamin D deficiency Last vitamin D Lab Results  Component Value Date   VD25OH 75.99 06/11/2020   Stable, cont oral replacement   Encounter for well adult exam with abnormal findings Age and sex appropriate education and counseling updated with regular exercise and diet Referrals for preventative services - for eye referral Immunizations addressed - decliens covid booster, flu shot, shingrix Smoking counseling  - none needed Evidence for depression or other mood disorder - none significant Most recent labs reviewed. 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, with long-term current use of insulin (HCC) Lab Results  Component Value Date   HGBA1C 6.7 (H) 04/26/2021   Stable, pt to  continue current medical treatment metformin   CKD (chronic kidney disease), stage II Lab Results  Component Value Date   CREATININE 1.17 04/26/2021   Stable overall, cont to avoid nephrotoxins   Essential hypertension BP Readings from Last 3 Encounters:  04/26/21 (!) 152/92  03/30/21 (!) 148/80  02/01/21 136/78   uncontrolled, pt to continue benicar, and add amlodipine 5 qd  Followup: Return in about 3 months (around 08/03/2021), or if symptoms worsen or fail to improve.  Oliver Barre, MD 04/29/2021 9:06 PM [facility]

## 2021-04-27 LAB — URINALYSIS, ROUTINE W REFLEX MICROSCOPIC
Bilirubin Urine: NEGATIVE
Hgb urine dipstick: NEGATIVE
Ketones, ur: NEGATIVE
Leukocytes,Ua: NEGATIVE
Nitrite: NEGATIVE
RBC / HPF: NONE SEEN (ref 0–?)
Specific Gravity, Urine: 1.01 (ref 1.000–1.030)
Total Protein, Urine: NEGATIVE
Urine Glucose: NEGATIVE
Urobilinogen, UA: 0.2 (ref 0.0–1.0)
WBC, UA: NONE SEEN (ref 0–?)
pH: 7 (ref 5.0–8.0)

## 2021-04-27 LAB — MICROALBUMIN / CREATININE URINE RATIO
Creatinine,U: 43.3 mg/dL
Microalb Creat Ratio: 2.3 mg/g (ref 0.0–30.0)
Microalb, Ur: 1 mg/dL (ref 0.0–1.9)

## 2021-04-29 ENCOUNTER — Encounter: Payer: Self-pay | Admitting: Internal Medicine

## 2021-04-29 NOTE — Assessment & Plan Note (Signed)
Lab Results  ?Component Value Date  ? HGBA1C 6.7 (H) 04/26/2021  ? ?Stable, pt to continue current medical treatment metformin ? ?

## 2021-04-29 NOTE — Assessment & Plan Note (Signed)
Lab Results  ?Component Value Date  ? CREATININE 1.17 04/26/2021  ? ?Stable overall, cont to avoid nephrotoxins ? ?

## 2021-04-29 NOTE — Assessment & Plan Note (Signed)
Age and sex appropriate education and counseling updated with regular exercise and diet ?Referrals for preventative services - for eye referral ?Immunizations addressed - decliens covid booster, flu shot, shingrix ?Smoking counseling  - none needed ?Evidence for depression or other mood disorder - none significant ?Most recent labs reviewed. ?I have personally reviewed and have noted: ?1) the patient's medical and social history ?2) The patient's current medications and supplements ?3) The patient's height, weight, and BMI have been recorded in the chart ? ?

## 2021-04-29 NOTE — Assessment & Plan Note (Signed)
BP Readings from Last 3 Encounters:  ?04/26/21 (!) 152/92  ?03/30/21 (!) 148/80  ?02/01/21 136/78  ? ?uncontrolled, pt to continue benicar, and add amlodipine 5 qd ? ?

## 2021-05-04 DIAGNOSIS — G7000 Myasthenia gravis without (acute) exacerbation: Secondary | ICD-10-CM | POA: Diagnosis not present

## 2021-05-17 ENCOUNTER — Other Ambulatory Visit: Payer: Self-pay | Admitting: Internal Medicine

## 2021-05-17 ENCOUNTER — Telehealth: Payer: Self-pay

## 2021-05-17 MED ORDER — OLMESARTAN MEDOXOMIL-HCTZ 40-25 MG PO TABS: 1.0000 | ORAL_TABLET | Freq: Every day | ORAL | 3 refills | Status: DC

## 2021-05-17 NOTE — Telephone Encounter (Signed)
[facility] states it was denied. He wants to know if he should take that or take amLODipine? He wants to know what he should do.  ? ?States he has three more pills.  ?

## 2021-05-17 NOTE — Telephone Encounter (Signed)
Pt is requesting a refill on: ?olmesartan-hydrochlorothiazide (BENICAR HCT) 40-25 MG tablet ? ?Pharmacy: ?[facility] ? ?LOV 04/26/21 ? ?

## 2021-05-17 NOTE — Telephone Encounter (Signed)
Patient notified that prescription has been sent to pharmacy.

## 2021-05-24 ENCOUNTER — Other Ambulatory Visit: Payer: Self-pay | Admitting: Internal Medicine

## 2021-05-24 NOTE — Telephone Encounter (Signed)
Please refill as per office routine med refill policy (all routine meds to be refilled for 3 mo or monthly (per pt preference) up to one year from last visit, then month to month grace period for 3 mo, then further med refills will have to be denied) ? ?

## 2021-06-23 DIAGNOSIS — M25511 Pain in right shoulder: Secondary | ICD-10-CM | POA: Diagnosis not present

## 2021-06-23 DIAGNOSIS — G8929 Other chronic pain: Secondary | ICD-10-CM | POA: Diagnosis not present

## 2021-06-23 DIAGNOSIS — M19011 Primary osteoarthritis, right shoulder: Secondary | ICD-10-CM | POA: Diagnosis not present

## 2021-06-24 DIAGNOSIS — K760 Fatty (change of) liver, not elsewhere classified: Secondary | ICD-10-CM | POA: Diagnosis not present

## 2021-07-01 ENCOUNTER — Telehealth: Payer: Self-pay | Admitting: Internal Medicine

## 2021-07-01 DIAGNOSIS — E559 Vitamin D deficiency, unspecified: Secondary | ICD-10-CM

## 2021-07-01 DIAGNOSIS — N182 Chronic kidney disease, stage 2 (mild): Secondary | ICD-10-CM

## 2021-07-01 DIAGNOSIS — E0822 Diabetes mellitus due to underlying condition with diabetic chronic kidney disease: Secondary | ICD-10-CM

## 2021-07-01 NOTE — Telephone Encounter (Signed)
Ok - done - thanks

## 2021-07-01 NOTE — Telephone Encounter (Signed)
Pt has 6 month f/u with PCP 6/21, lab appt 6/15.  Please enter lab orders.

## 2021-07-19 DIAGNOSIS — G7000 Myasthenia gravis without (acute) exacerbation: Secondary | ICD-10-CM | POA: Diagnosis not present

## 2021-07-19 DIAGNOSIS — H02402 Unspecified ptosis of left eyelid: Secondary | ICD-10-CM | POA: Diagnosis not present

## 2021-07-19 DIAGNOSIS — M25511 Pain in right shoulder: Secondary | ICD-10-CM | POA: Diagnosis not present

## 2021-07-19 DIAGNOSIS — R29898 Other symptoms and signs involving the musculoskeletal system: Secondary | ICD-10-CM | POA: Diagnosis not present

## 2021-07-19 DIAGNOSIS — G5602 Carpal tunnel syndrome, left upper limb: Secondary | ICD-10-CM | POA: Diagnosis not present

## 2021-07-19 DIAGNOSIS — M25512 Pain in left shoulder: Secondary | ICD-10-CM | POA: Diagnosis not present

## 2021-07-19 DIAGNOSIS — H532 Diplopia: Secondary | ICD-10-CM | POA: Diagnosis not present

## 2021-07-19 DIAGNOSIS — G8929 Other chronic pain: Secondary | ICD-10-CM | POA: Diagnosis not present

## 2021-07-26 DIAGNOSIS — R748 Abnormal levels of other serum enzymes: Secondary | ICD-10-CM | POA: Diagnosis not present

## 2021-07-26 DIAGNOSIS — R140 Abdominal distension (gaseous): Secondary | ICD-10-CM | POA: Diagnosis not present

## 2021-07-26 DIAGNOSIS — K760 Fatty (change of) liver, not elsewhere classified: Secondary | ICD-10-CM | POA: Diagnosis not present

## 2021-07-26 DIAGNOSIS — E669 Obesity, unspecified: Secondary | ICD-10-CM | POA: Diagnosis not present

## 2021-07-28 ENCOUNTER — Other Ambulatory Visit: Payer: Medicare HMO

## 2021-08-03 ENCOUNTER — Ambulatory Visit: Payer: Medicare HMO | Admitting: Internal Medicine

## 2021-08-09 ENCOUNTER — Ambulatory Visit (INDEPENDENT_AMBULATORY_CARE_PROVIDER_SITE_OTHER): Payer: Medicare HMO | Admitting: Internal Medicine

## 2021-08-09 ENCOUNTER — Ambulatory Visit: Payer: Medicare HMO | Admitting: Internal Medicine

## 2021-08-09 ENCOUNTER — Encounter: Payer: Self-pay | Admitting: Internal Medicine

## 2021-08-09 VITALS — BP 134/78 | HR 64 | Temp 98.0°F | Ht 72.0 in | Wt 235.0 lb

## 2021-08-09 DIAGNOSIS — K760 Fatty (change of) liver, not elsewhere classified: Secondary | ICD-10-CM

## 2021-08-09 DIAGNOSIS — R972 Elevated prostate specific antigen [PSA]: Secondary | ICD-10-CM

## 2021-08-09 DIAGNOSIS — N529 Male erectile dysfunction, unspecified: Secondary | ICD-10-CM

## 2021-08-09 DIAGNOSIS — H906 Mixed conductive and sensorineural hearing loss, bilateral: Secondary | ICD-10-CM

## 2021-08-09 DIAGNOSIS — I10 Essential (primary) hypertension: Secondary | ICD-10-CM | POA: Diagnosis not present

## 2021-08-09 DIAGNOSIS — E119 Type 2 diabetes mellitus without complications: Secondary | ICD-10-CM

## 2021-08-09 DIAGNOSIS — E559 Vitamin D deficiency, unspecified: Secondary | ICD-10-CM | POA: Diagnosis not present

## 2021-08-09 MED ORDER — CYCLOBENZAPRINE HCL 5 MG PO TABS: 5.0000 mg | ORAL_TABLET | Freq: Three times a day (TID) | ORAL | 5 refills | Status: DC | PRN

## 2021-08-09 NOTE — Progress Notes (Signed)
Patient ID: Miguel Powell, male   DOB: [birth year], 61 y.o.   MRN: [id number]        Chief Complaint: follow up recent GI visit with fatty liver, dm, ED, and hx of elevated PSA        HPI:  Miguel Powell is a 61 y.o. male here with c/o 1 wk onset bilateral hearing loss right> left, without fever, HA, ST or cough.          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 since 1990s   Myasthenia gravis ([place])    Sinus drainage 02/03/2016   Wears partial dentures    upper    Past Surgical History:  Procedure Laterality Date   LASER ABLATION OF CONDYLOMAS  11/04/2009   LUMBAR [place] CONDYLOMA N/A 02/10/2016   Procedure: EXCISION AND FULGURATION OF ANAL CONDYLOMA;  Surgeon: Jackolyn Confer, MD;  Location: [place];  Service: General;  Laterality: N/A;    reports that he has never smoked. He has never used smokeless tobacco. He reports current alcohol use. He reports that he does not use drugs. family history includes Aneurysm (age of onset: 75) in his brother; Cancer in his sister; Diabetes in his mother and sister; Hypertension in his sister. Allergies  Allergen Reactions   Azithromycin Other (See Comments)    DUE TO MYASTHENIA GRAVIS   Botox [Botulinum Toxin Type A] Other (See Comments)    DUE TO MYASTHENIA GRAVIS   Gentamycin [Gentamicin] Other (See Comments)    DUE TO MYASTHENIA GRAVIS   Ketek [Telithromycin] Other (See Comments)    DUE TO MYASTHENIA GRAVIS   Levaquin [Levofloxacin In D5w] Other (See  Comments)    DUE TO MYASTHENIA GRAVIS   Magnesium-Containing Compounds Other (See Comments)    DUE TO MYASTHENIA GRAVIS   Neomycin Other (See Comments)    DUE TO MYASTHENIA GRAVIS   Penicillins Other (See Comments)    DUE TO MYASTHENIA GRAVIS    Procainamide Other (See Comments)    DUE TO MYASTHENIA GRAVIS   Quinine Derivatives Other (See Comments)    DUE TO MYASTHENIA GRAVIS   Current Outpatient Medications on File Prior to Visit  Medication Sig Dispense Refill   ALPHA LIPOIC ACID PO Take by mouth.     amLODipine (NORVASC) 5 MG tablet Take 1 tablet (5 mg total) by mouth daily. 90 tablet 3   azaTHIOprine (IMURAN) 50 MG tablet Take 200 mg by mouth daily.     azelastine (ASTELIN) 0.1 % nasal spray SPRAY TWO SPRAYS IN EACH NOSTRIL TWICE DAILY AS DIRECTED 30 mL 5   calcium-vitamin D (OSCAL-500) 500-400 MG-UNIT tablet Take 1 tablet by mouth daily. 500-600     CHROMIUM PO Take by mouth.     Cranberry-Milk Thistle (LIVER & KIDNEY CLEANSER PO) Take by mouth.     diphenhydrAMINE (BENADRYL) 25 MG tablet Take 25 mg by mouth every 6 (six)  hours as needed for sleep.      doxycycline (VIBRA-TABS) 100 MG tablet Take 1 tablet (100 mg total) by mouth 2 (two) times daily. 20 tablet 0   metFORMIN (GLUCOPHAGE-XR) 500 MG 24 hr tablet Take 1 tablet by mouth once daily with breakfast 90 tablet 3   Misc Natural Products (HEALTHY LIVER PO) Take by mouth.     Multiple Vitamin (MULTIVITAMIN) tablet Take 1 tablet by mouth daily.     NON FORMULARY Adult stem cell activator     NON FORMULARY PC brain and liver     olmesartan-hydrochlorothiazide (BENICAR HCT) 40-25 MG tablet Take 1 tablet by mouth daily. 90 tablet 3   predniSONE (DELTASONE) 2.5 MG tablet      pyridostigmine (MESTINON) 60 MG tablet Take 60 mg by mouth 4 (four) times daily.     ranitidine (ZANTAC) 150 MG tablet Take 1 tablet (150 mg total) by mouth 2 (two) times daily. 180 tablet 3   RESVERATROL PO Take 1 each by mouth daily.     simethicone  (GAS-X) 80 MG chewable tablet Chew 1 tablet (80 mg total) by mouth every 6 (six) hours as needed for flatulence. 30 tablet 0   UNABLE TO FIND Take by mouth.     No current facility-administered medications on file prior to visit.        ROS:  All others reviewed and negative.  Objective        PE:  BP 134/78 (BP Location: Left Arm, Patient Position: Sitting, Cuff Size: Large)   Pulse 64   Temp 98 F (36.7 C) (Oral)   Ht 6' (1.829 m)   Wt 235 lb (106.6 kg)   SpO2 98%   BMI 31.87 kg/m                 Constitutional: Pt appears in NAD               HENT: Head: NCAT.                Right Ear: External ear normal.                 Left Ear: External ear normal.  Bilat TMs with wax impaction resolved with irrigation, and hearing improved               Eyes: . Pupils are equal, round, and reactive to light. Conjunctivae and EOM are normal               Nose: without d/c or deformity               Neck: Neck supple. Gross normal ROM               Cardiovascular: Normal rate and regular rhythm.                 Pulmonary/Chest: Effort normal and breath sounds without rales or wheezing.                Abd:  Soft, NT, ND, + BS, no organomegaly               Neurological: Pt is alert. At baseline orientation, motor grossly intact               Skin: Skin is warm. No rashes, no other new lesions, LE edema - none               Psychiatric: Pt behavior is normal without agitation  Micro: none  Cardiac tracings I have personally interpreted today:  none  Pertinent Radiological findings (summarize): none   Lab Results  Component Value Date   WBC 4.3 04/26/2021   HGB 15.0 04/26/2021   HCT 44.6 04/26/2021   PLT 259.0 04/26/2021   GLUCOSE 105 (H) 04/26/2021   CHOL 171 04/26/2021   TRIG 98.0 04/26/2021   HDL 57.10 04/26/2021   LDLCALC 94 04/26/2021   ALT 58 (H) 04/26/2021   AST 53 (H) 04/26/2021   NA 139 04/26/2021   K 3.4 (L) 04/26/2021   CL 99 04/26/2021   CREATININE 1.17  04/26/2021   BUN 13 04/26/2021   CO2 31 04/26/2021   TSH 0.81 04/26/2021   PSA 1.66 04/26/2021   HGBA1C 6.7 (H) 04/26/2021   MICROALBUR 1.0 04/26/2021   Assessment/Plan:  Miguel Powell is a 61 y.o. Black or African American [2] male with  has a past medical history of Anal condyloma (01/2016), Arthritis, HLD (hyperlipidemia) (05/03/2018), Hypertension, Myasthenia gravis ([place]), Sinus drainage (02/03/2016), and Wears partial dentures.  Vitamin D deficiency Last vitamin D Lab Results  Component Value Date   VD25OH 57.73 04/26/2021   Stable, cont oral replacement   Type 2 diabetes mellitus without complications (HCC) Lab Results  Component Value Date   HGBA1C 6.7 (H) 04/26/2021   Stable, pt to continue current medical treatment metformin ER 500 - 1 qd   Increased prostate specific antigen (PSA) velocity Lab Results  Component Value Date   PSA 1.66 04/26/2021   PSA 1.79 06/11/2020   PSA 1.60 05/08/2019   Improved recently, pt plans to also f/u with urology  Erectile dysfunction Recent worsening x 6 mo, pt asks for urology as may need shots  Fatty liver S/p recent GI evaluation, cont to work on wt control, low fat diet  Essential hypertension BP Readings from Last 3 Encounters:  08/09/21 134/78  04/26/21 (!) 152/92  03/30/21 (!) 148/80   Stable to improved, pt to continue medical treatment novrasc 5 mg qd, benicar hct 40-25 mg qd   Mixed hearing loss, bilateral Improved s/p irrigation wax impactions  Ceruminosis is noted.  Wax is removed by syringing and manual debridement. Instructions for home care to prevent wax buildup are given.  Followup: Return in about 4 months (around 12/09/2021).  Cathlean Cower, MD 08/13/2021 3:01 PM [facility]

## 2021-08-10 DIAGNOSIS — M75121 Complete rotator cuff tear or rupture of right shoulder, not specified as traumatic: Secondary | ICD-10-CM | POA: Diagnosis not present

## 2021-08-10 DIAGNOSIS — G8929 Other chronic pain: Secondary | ICD-10-CM | POA: Diagnosis not present

## 2021-08-10 DIAGNOSIS — M25511 Pain in right shoulder: Secondary | ICD-10-CM | POA: Diagnosis not present

## 2021-08-10 DIAGNOSIS — M75112 Incomplete rotator cuff tear or rupture of left shoulder, not specified as traumatic: Secondary | ICD-10-CM | POA: Diagnosis not present

## 2021-08-10 DIAGNOSIS — M25512 Pain in left shoulder: Secondary | ICD-10-CM | POA: Diagnosis not present

## 2021-08-13 ENCOUNTER — Encounter: Payer: Self-pay | Admitting: Internal Medicine

## 2021-08-13 NOTE — Assessment & Plan Note (Signed)
Improved s/p irrigation wax impactions  Ceruminosis is noted.  Wax is removed by syringing and manual debridement. Instructions for home care to prevent wax buildup are given.

## 2021-08-13 NOTE — Assessment & Plan Note (Signed)
Last vitamin D Lab Results  Component Value Date   VD25OH 57.73 04/26/2021   Stable, cont oral replacement

## 2021-08-13 NOTE — Assessment & Plan Note (Addendum)
Recent worsening x 6 mo, pt asks for urology as may need shots

## 2021-08-13 NOTE — Assessment & Plan Note (Signed)
Lab Results  Component Value Date   PSA 1.66 04/26/2021   PSA 1.79 06/11/2020   PSA 1.60 05/08/2019   Improved recently, pt plans to also f/u with urology

## 2021-08-13 NOTE — Assessment & Plan Note (Signed)
Lab Results  Component Value Date   HGBA1C 6.7 (H) 04/26/2021   Stable, pt to continue current medical treatment metformin ER 500 - 1 qd

## 2021-08-13 NOTE — Assessment & Plan Note (Signed)
BP Readings from Last 3 Encounters:  08/09/21 134/78  04/26/21 (!) 152/92  03/30/21 (!) 148/80   Stable to improved, pt to continue medical treatment novrasc 5 mg qd, benicar hct 40-25 mg qd

## 2021-08-13 NOTE — Assessment & Plan Note (Signed)
S/p recent GI evaluation, cont to work on wt control, low fat diet

## 2021-08-25 DIAGNOSIS — G7000 Myasthenia gravis without (acute) exacerbation: Secondary | ICD-10-CM | POA: Diagnosis not present

## 2021-09-13 DIAGNOSIS — M75112 Incomplete rotator cuff tear or rupture of left shoulder, not specified as traumatic: Secondary | ICD-10-CM | POA: Diagnosis not present

## 2021-09-13 DIAGNOSIS — M75121 Complete rotator cuff tear or rupture of right shoulder, not specified as traumatic: Secondary | ICD-10-CM | POA: Diagnosis not present

## 2021-11-01 DIAGNOSIS — N5201 Erectile dysfunction due to arterial insufficiency: Secondary | ICD-10-CM | POA: Diagnosis not present

## 2021-12-30 DIAGNOSIS — M75112 Incomplete rotator cuff tear or rupture of left shoulder, not specified as traumatic: Secondary | ICD-10-CM | POA: Diagnosis not present

## 2021-12-30 DIAGNOSIS — M75121 Complete rotator cuff tear or rupture of right shoulder, not specified as traumatic: Secondary | ICD-10-CM | POA: Diagnosis not present

## 2022-01-16 DIAGNOSIS — R053 Chronic cough: Secondary | ICD-10-CM | POA: Diagnosis not present

## 2022-01-16 DIAGNOSIS — J310 Chronic rhinitis: Secondary | ICD-10-CM | POA: Diagnosis not present

## 2022-01-16 DIAGNOSIS — R490 Dysphonia: Secondary | ICD-10-CM | POA: Diagnosis not present

## 2022-01-24 DIAGNOSIS — M75121 Complete rotator cuff tear or rupture of right shoulder, not specified as traumatic: Secondary | ICD-10-CM | POA: Diagnosis not present

## 2022-02-07 ENCOUNTER — Encounter: Payer: Self-pay | Admitting: Pharmacist

## 2022-02-07 NOTE — Progress Notes (Addendum)
[facility])                                            [facility] Quality Pharmacy Team                                        Statin Quality Measure Assessment    02/07/2022  Miguel Powell 04-04-1960 [id number]  I am a [facility] clinical pharmacist that reviews patients for statin quality initiatives.     Per review of chart and payor information, [name] has a diagnosis of diabetes but is not currently filling a statin prescription.  This places patient into the SUPD (Statin Use In Patients with Diabetes) measure for CMS.    I could not find any documentation of previous trial of a statin or a history of statin intolerance. In the past, [name] has been coded 73.9 and 72.9. If these are still clinically appropriate, please consider coding these at tomorrow's office visit to remove [name] from the SUPD measure for 2024.  His current calculated 10-year ASCVD risk score (Arnett DK, et al., 2019) is: 18.5%    Please consider ONE of the following recommendations:  Initiate high intensity statin Atorvastatin 40mg  once daily, #90, 3 refills   Rosuvastatin 20mg  once daily, #90, 3 refills    Initiate moderate intensity          statin with reduced frequency if prior          statin intolerance 1x weekly, #13, 3 refills   2x weekly, #26, 3 refills   3x weekly, #39, 3 refills    Code for past statin intolerance or  other exclusions (required annually)  Provider Requirements: Associate code during an office visit or telehealth encounter  Drug Induced Myopathy G72.0   Myopathy, unspecified G72.9   Myositis, unspecified M60.9   Rhabdomyolysis M62.82   Alcoholic fatty liver K70.0   Cirrhosis of liver K74.69   Prediabetes R73.03   Thank you for your time and consideration, Dellie Burns, PharmD [facility]  [facility] Clinical Pharmacist Office: [phone]

## 2022-02-08 ENCOUNTER — Ambulatory Visit: Payer: Medicare HMO | Admitting: Internal Medicine

## 2022-02-22 DIAGNOSIS — Z79624 Long term (current) use of inhibitors of nucleotide synthesis: Secondary | ICD-10-CM | POA: Diagnosis not present

## 2022-02-22 DIAGNOSIS — G7000 Myasthenia gravis without (acute) exacerbation: Secondary | ICD-10-CM | POA: Diagnosis not present

## 2022-02-22 DIAGNOSIS — Z5181 Encounter for therapeutic drug level monitoring: Secondary | ICD-10-CM | POA: Diagnosis not present

## 2022-02-24 ENCOUNTER — Other Ambulatory Visit (INDEPENDENT_AMBULATORY_CARE_PROVIDER_SITE_OTHER): Payer: Medicare HMO

## 2022-02-24 ENCOUNTER — Telehealth: Payer: Self-pay | Admitting: Internal Medicine

## 2022-02-24 DIAGNOSIS — E559 Vitamin D deficiency, unspecified: Secondary | ICD-10-CM | POA: Diagnosis not present

## 2022-02-24 DIAGNOSIS — E0822 Diabetes mellitus due to underlying condition with diabetic chronic kidney disease: Secondary | ICD-10-CM

## 2022-02-24 DIAGNOSIS — R972 Elevated prostate specific antigen [PSA]: Secondary | ICD-10-CM

## 2022-02-24 DIAGNOSIS — N5201 Erectile dysfunction due to arterial insufficiency: Secondary | ICD-10-CM | POA: Diagnosis not present

## 2022-02-24 DIAGNOSIS — Z794 Long term (current) use of insulin: Secondary | ICD-10-CM

## 2022-02-24 DIAGNOSIS — E119 Type 2 diabetes mellitus without complications: Secondary | ICD-10-CM

## 2022-02-24 LAB — HEPATIC FUNCTION PANEL
ALT: 30 U/L (ref 0–53)
AST: 47 U/L — ABNORMAL HIGH (ref 0–37)
Albumin: 4.2 g/dL (ref 3.5–5.2)
Alkaline Phosphatase: 40 U/L (ref 39–117)
Bilirubin, Direct: 0.1 mg/dL (ref 0.0–0.3)
Total Bilirubin: 0.8 mg/dL (ref 0.2–1.2)
Total Protein: 7 g/dL (ref 6.0–8.3)

## 2022-02-24 LAB — LIPID PANEL
Cholesterol: 196 mg/dL (ref 0–200)
HDL: 57.5 mg/dL (ref >39.00)
LDL Cholesterol: 109 mg/dL — ABNORMAL HIGH (ref 0–99)
NonHDL: 138
Total CHOL/HDL Ratio: 3
Triglycerides: 144 mg/dL (ref 0.0–149.0)
VLDL: 28.8 mg/dL (ref 0.0–40.0)

## 2022-02-24 LAB — BASIC METABOLIC PANEL
BUN: 16 mg/dL (ref 6–23)
CO2: 32 mEq/L (ref 19–32)
Calcium: 9.2 mg/dL (ref 8.4–10.5)
Chloride: 96 mEq/L (ref 96–112)
Creatinine, Ser: 1.28 mg/dL (ref 0.40–1.50)
GFR: 59.64 mL/min — ABNORMAL LOW (ref >60.00)
Glucose, Bld: 211 mg/dL — ABNORMAL HIGH (ref 70–99)
Potassium: 3.7 mEq/L (ref 3.5–5.1)
Sodium: 137 mEq/L (ref 135–145)

## 2022-02-24 LAB — VITAMIN D 25 HYDROXY (VIT D DEFICIENCY, FRACTURES): VITD: 51.67 ng/mL (ref 30.00–100.00)

## 2022-02-24 LAB — HEMOGLOBIN A1C: Hgb A1c MFr Bld: 11.3 % — ABNORMAL HIGH (ref 4.6–6.5)

## 2022-02-24 NOTE — Telephone Encounter (Signed)
[facility] for lab - orders are in

## 2022-02-24 NOTE — Telephone Encounter (Signed)
Patient called and said he had some lab work done at [facility] and that his blood sugar labs were off, They told him to contact his PCP to schedule with [place] to get it checked to get a non false reading. Is this okay to schedule. [phone]

## 2022-02-24 NOTE — Telephone Encounter (Signed)
Please place lab orders or would you like to see him for an follow up. Please advise

## 2022-02-25 ENCOUNTER — Other Ambulatory Visit: Payer: Self-pay | Admitting: Internal Medicine

## 2022-02-25 NOTE — Telephone Encounter (Signed)
Please refill as per office routine med refill policy (all routine meds to be refilled for 3 mo or monthly (per pt preference) up to one year from last visit, then month to month grace period for 3 mo, then further med refills will have to be denied)

## 2022-02-27 NOTE — Telephone Encounter (Signed)
Notified. 

## 2022-02-27 NOTE — Telephone Encounter (Signed)
Called pt gave him MD response. Made appt for Friday 03/03/22.Marland KitchenJohny Powell

## 2022-03-03 ENCOUNTER — Ambulatory Visit (INDEPENDENT_AMBULATORY_CARE_PROVIDER_SITE_OTHER): Payer: Medicare HMO | Admitting: Internal Medicine

## 2022-03-03 VITALS — BP 128/78 | HR 70 | Temp 98.0°F | Ht 72.0 in | Wt 242.8 lb

## 2022-03-03 DIAGNOSIS — E559 Vitamin D deficiency, unspecified: Secondary | ICD-10-CM | POA: Diagnosis not present

## 2022-03-03 DIAGNOSIS — N182 Chronic kidney disease, stage 2 (mild): Secondary | ICD-10-CM

## 2022-03-03 DIAGNOSIS — E119 Type 2 diabetes mellitus without complications: Secondary | ICD-10-CM

## 2022-03-03 DIAGNOSIS — I10 Essential (primary) hypertension: Secondary | ICD-10-CM | POA: Diagnosis not present

## 2022-03-03 DIAGNOSIS — Z0001 Encounter for general adult medical examination with abnormal findings: Secondary | ICD-10-CM

## 2022-03-03 DIAGNOSIS — E7800 Pure hypercholesterolemia, unspecified: Secondary | ICD-10-CM

## 2022-03-03 LAB — POCT GLYCOSYLATED HEMOGLOBIN (HGB A1C): Hemoglobin A1C: 10.7 % — AB (ref 4.0–5.6)

## 2022-03-03 MED ORDER — TIRZEPATIDE 2.5 MG/0.5ML ~~LOC~~ SOAJ: 2.5000 mg | SUBCUTANEOUS | 11 refills | Status: DC

## 2022-03-03 NOTE — Patient Instructions (Signed)
Your A1c was done today  Please take all new medication as prescribed - the mounjaro 2.5,mg in addition to working on your diet and weight  Please continue all other medications as before, including the metformin ER 500 mg at 1 per day  Please have the pharmacy call with any other refills you may need.  Please continue your efforts at being more active, low cholesterol diet, and weight control.  Please keep your appointments with your specialists as you may have planned  Please make an Appointment to return in 6 weeks, or sooner if needed

## 2022-03-03 NOTE — Progress Notes (Signed)
Patient ID: Winferd Wease, male   DOB: 02-Feb-1961, 62 y.o.   MRN: [id number]        Chief Complaint: follow up HTN, HLD and DM, low vit d, ckd 2       HPI:  Zeddie Njie is a 62 y.o. male here to f/u sudden elevated A1c, admits to poor diet recently, lack of exercise and wt gain.  Does not want DM education, but will work on lifestyle changes.  Hard to lose wt with diet and exercise.  Pt denies chest pain, increased sob or doe, wheezing, orthopnea, PND, increased LE swelling, palpitations, dizziness or syncope.   Pt denies polydipsia, polyuria, or new focal neuro s/s.    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 since 1990s   Myasthenia gravis ([place])    Sinus drainage 02/03/2016   Wears partial dentures    upper    Past Surgical History:  Procedure Laterality Date   LASER ABLATION OF CONDYLOMAS  11/04/2009   LUMBAR SPINE SURGERY  1986   MINOR FULGERATION OF ANAL CONDYLOMA N/A 02/10/2016   Procedure: EXCISION AND FULGURATION OF ANAL CONDYLOMA;  Surgeon: Jackolyn Confer, MD;  Location: [place];  Service: General;  Laterality: N/A;    reports that he has never smoked. He has never used smokeless tobacco. He reports current alcohol use. He reports that he does not use drugs. family history includes Aneurysm (age of onset: 31) in his brother; Cancer in his sister; Diabetes in his mother and sister; Hypertension in his sister. Allergies  Allergen Reactions   Azithromycin Other (See Comments)     DUE TO MYASTHENIA GRAVIS   Botox [Botulinum Toxin Type A] Other (See Comments)    DUE TO MYASTHENIA GRAVIS   Gentamycin [Gentamicin] Other (See Comments)    DUE TO MYASTHENIA GRAVIS   Ketek [Telithromycin] Other (See Comments)    DUE TO MYASTHENIA GRAVIS   Levaquin [Levofloxacin In D5w] Other (See Comments)    DUE TO MYASTHENIA GRAVIS   Magnesium-Containing Compounds Other (See Comments)    DUE TO MYASTHENIA GRAVIS   Neomycin Other (See Comments)    DUE TO MYASTHENIA GRAVIS   Penicillins Other (See Comments)    DUE TO MYASTHENIA GRAVIS    Procainamide Other (See Comments)    DUE TO MYASTHENIA GRAVIS   Quinine Derivatives Other (See Comments)    DUE TO MYASTHENIA GRAVIS   Current Outpatient Medications on File Prior to Visit  Medication Sig Dispense Refill   ALPHA LIPOIC ACID PO Take by mouth.     amLODipine (NORVASC) 5 MG tablet Take 1 tablet (5 mg total) by mouth daily. 90 tablet 3   azaTHIOprine (IMURAN) 50 MG tablet Take 200 mg by mouth daily.     azelastine (ASTELIN) 0.1 % nasal spray SPRAY TWO SPRAYS IN EACH NOSTRIL TWICE DAILY AS DIRECTED 30 mL 5   azelastine (  ASTELIN) 0.1 % nasal spray Azelastine HCl     calcium-vitamin D (OSCAL-500) 500-400 MG-UNIT tablet Take 1 tablet by mouth daily. 500-600     CHROMIUM PO Take by mouth.     Cranberry-Milk Thistle (LIVER & KIDNEY CLEANSER PO) Take by mouth.     cyclobenzaprine (FLEXERIL) 5 MG tablet Take 1 tablet (5 mg total) by mouth 3 (three) times daily as needed. for muscle spams 60 tablet 5   diphenhydrAMINE (BENADRYL) 25 MG tablet Take 25 mg by mouth every 6 (six) hours as needed for sleep.      doxycycline (VIBRA-TABS) 100 MG tablet Take 1 tablet (100 mg total) by mouth 2 (two) times daily. 20 tablet 0   metFORMIN (GLUCOPHAGE-XR) 500 MG 24 hr tablet Take 1 tablet by mouth once daily with breakfast 90 tablet 3   Misc Natural Products (HEALTHY LIVER PO) Take by mouth.     Multiple Vitamin (MULTIVITAMIN) tablet Take 1 tablet by  mouth daily.     NON FORMULARY Adult stem cell activator     NON FORMULARY PC brain and liver     olmesartan-hydrochlorothiazide (BENICAR HCT) 40-25 MG tablet Take 1 tablet by mouth daily. 90 tablet 3   predniSONE (DELTASONE) 2.5 MG tablet      pyridostigmine (MESTINON) 60 MG tablet Take 60 mg by mouth 4 (four) times daily.     ranitidine (ZANTAC) 150 MG tablet Take 1 tablet (150 mg total) by mouth 2 (two) times daily. 180 tablet 3   RESVERATROL PO Take 1 each by mouth daily.     simethicone (GAS-X) 80 MG chewable tablet Chew 1 tablet (80 mg total) by mouth every 6 (six) hours as needed for flatulence. 30 tablet 0   UNABLE TO FIND Take by mouth.     No current facility-administered medications on file prior to visit.        ROS:  All others reviewed and negative.  Objective        PE:  BP 128/78 (BP Location: Left Arm, Patient Position: Sitting, Cuff Size: Large)   Pulse 70   Temp 98 F (36.7 C) (Oral)   Ht 6' (1.829 m)   Wt 242 lb 12.8 oz (110.1 kg)   SpO2 95%   BMI 32.93 kg/m                 Constitutional: Pt appears in NAD               HENT: Head: NCAT.                Right Ear: External ear normal.                 Left Ear: External ear normal.                Eyes: . Pupils are equal, round, and reactive to light. Conjunctivae and EOM are normal               Nose: without d/c or deformity               Neck: Neck supple. Gross normal ROM               Cardiovascular: Normal rate and regular rhythm.                 Pulmonary/Chest: Effort normal and breath sounds without rales or wheezing.                Abd:  Soft, NT, ND, + BS, no organomegaly               Neurological: Pt is alert. At baseline orientation, motor grossly intact               Skin: Skin is warm. No rashes, no other new lesions, LE edema - none               Psychiatric: Pt behavior is normal without agitation   Micro: none  Cardiac tracings I have personally interpreted today:  none  Pertinent  Radiological findings (summarize): none   Lab Results  Component Value Date   WBC 4.3 04/26/2021   HGB 15.0 04/26/2021   HCT 44.6 04/26/2021   PLT 259.0 04/26/2021   GLUCOSE 211 (H) 02/24/2022   CHOL 196 02/24/2022   TRIG 144.0 02/24/2022   HDL 57.50 02/24/2022   LDLCALC 109 (H) 02/24/2022   ALT 30 02/24/2022   AST 47 (H) 02/24/2022   NA 137 02/24/2022   K 3.7 Hemolysis seen... 02/24/2022   CL 96 02/24/2022   CREATININE 1.28 02/24/2022   BUN 16 02/24/2022   CO2 32 02/24/2022   TSH 0.81 04/26/2021   PSA 1.66 04/26/2021   HGBA1C 10.7 (A) 03/03/2022   MICROALBUR 1.0 04/26/2021   Assessment/Plan:  Javarius Tsosie is a 62 y.o. 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  Type 2 diabetes mellitus without complications (HCC) Lab Results  Component Value Date   HGBA1C 10.7 (A) 03/03/2022   Uncontrolled, repeat A1c today still severely high, goal A1c < 7,, pt to continue current medical treatment netfrmin ER 500 mg qd, but also add mounjaro 2.5 mg weekly for sugar and wt control   Vitamin D deficiency Last vitamin D Lab Results  Component Value Date   VD25OH 51.67 02/24/2022   Stable, cont oral replacement   CKD (chronic kidney disease), stage II Lab Results  Component Value Date   CREATININE 1.28 02/24/2022   Stable overall, cont to avoid nephrotoxins  Essential hypertension BP Readings from Last 3 Encounters:  03/03/22 128/78  08/09/21 134/78  04/26/21 (!) 152/92   Stable, pt to continue medical treatment norvasc 5 mg qd, benicar hct 40-25 mg qd   HLD (hyperlipidemia) Lab Results  Component Value Date   LDLCALC 109 (H) 02/24/2022   Uncontrolled, pt for lower chol diet, declines statin  Followup: Return in about 6 weeks (around 04/14/2022).  Cathlean Cower, MD 03/04/2022  9:46 PM [facility]

## 2022-03-04 ENCOUNTER — Encounter: Payer: Self-pay | Admitting: Internal Medicine

## 2022-03-04 NOTE — Assessment & Plan Note (Signed)
BP Readings from Last 3 Encounters:  03/03/22 128/78  08/09/21 134/78  04/26/21 (!) 152/92   Stable, pt to continue medical treatment norvasc 5 mg qd, benicar hct 40-25 mg qd

## 2022-03-04 NOTE — Assessment & Plan Note (Signed)
Lab Results  Component Value Date   CREATININE 1.28 02/24/2022   Stable overall, cont to avoid nephrotoxins

## 2022-03-04 NOTE — Assessment & Plan Note (Signed)
Last vitamin D Lab Results  Component Value Date   VD25OH 51.67 02/24/2022   Stable, cont oral replacement

## 2022-03-04 NOTE — Assessment & Plan Note (Signed)
Lab Results  Component Value Date   LDLCALC 109 (H) 02/24/2022   Uncontrolled, pt for lower chol diet, declines statin

## 2022-03-04 NOTE — Assessment & Plan Note (Signed)
Lab Results  Component Value Date   HGBA1C 10.7 (A) 03/03/2022   Uncontrolled, repeat A1c today still severely high, goal A1c < 7,, pt to continue current medical treatment netfrmin ER 500 mg qd, but also add mounjaro 2.5 mg weekly for sugar and wt control

## 2022-03-21 DIAGNOSIS — E538 Deficiency of other specified B group vitamins: Secondary | ICD-10-CM | POA: Diagnosis not present

## 2022-03-21 DIAGNOSIS — R7309 Other abnormal glucose: Secondary | ICD-10-CM | POA: Diagnosis not present

## 2022-03-21 DIAGNOSIS — N181 Chronic kidney disease, stage 1: Secondary | ICD-10-CM | POA: Diagnosis not present

## 2022-03-21 DIAGNOSIS — R29898 Other symptoms and signs involving the musculoskeletal system: Secondary | ICD-10-CM | POA: Diagnosis not present

## 2022-03-21 DIAGNOSIS — E0822 Diabetes mellitus due to underlying condition with diabetic chronic kidney disease: Secondary | ICD-10-CM | POA: Diagnosis not present

## 2022-03-21 DIAGNOSIS — R7989 Other specified abnormal findings of blood chemistry: Secondary | ICD-10-CM | POA: Diagnosis not present

## 2022-03-21 DIAGNOSIS — G7000 Myasthenia gravis without (acute) exacerbation: Secondary | ICD-10-CM | POA: Diagnosis not present

## 2022-03-21 DIAGNOSIS — F1920 Other psychoactive substance dependence, uncomplicated: Secondary | ICD-10-CM | POA: Diagnosis not present

## 2022-03-21 DIAGNOSIS — H02402 Unspecified ptosis of left eyelid: Secondary | ICD-10-CM | POA: Diagnosis not present

## 2022-04-03 ENCOUNTER — Telehealth: Payer: Self-pay | Admitting: Internal Medicine

## 2022-04-03 MED ORDER — TIRZEPATIDE 2.5 MG/0.5ML ~~LOC~~ SOAJ: 2.5000 mg | SUBCUTANEOUS | 11 refills | Status: DC

## 2022-04-03 NOTE — Telephone Encounter (Signed)
Patient would like his mounjaro sent to [facility] on  [address] - [place], [place]

## 2022-04-03 NOTE — Telephone Encounter (Signed)
Ok this is done 

## 2022-04-04 NOTE — Telephone Encounter (Signed)
LM for pt that medication was sent in

## 2022-04-14 ENCOUNTER — Ambulatory Visit: Payer: Medicare HMO | Admitting: Internal Medicine

## 2022-04-18 DIAGNOSIS — M533 Sacrococcygeal disorders, not elsewhere classified: Secondary | ICD-10-CM | POA: Diagnosis not present

## 2022-04-18 DIAGNOSIS — M25551 Pain in right hip: Secondary | ICD-10-CM | POA: Diagnosis not present

## 2022-04-21 DIAGNOSIS — M9905 Segmental and somatic dysfunction of pelvic region: Secondary | ICD-10-CM | POA: Diagnosis not present

## 2022-04-21 DIAGNOSIS — M9904 Segmental and somatic dysfunction of sacral region: Secondary | ICD-10-CM | POA: Diagnosis not present

## 2022-04-21 DIAGNOSIS — M5417 Radiculopathy, lumbosacral region: Secondary | ICD-10-CM | POA: Diagnosis not present

## 2022-04-21 DIAGNOSIS — M9903 Segmental and somatic dysfunction of lumbar region: Secondary | ICD-10-CM | POA: Diagnosis not present

## 2022-04-24 ENCOUNTER — Ambulatory Visit: Payer: Medicare HMO | Admitting: Internal Medicine

## 2022-04-24 DIAGNOSIS — M9905 Segmental and somatic dysfunction of pelvic region: Secondary | ICD-10-CM | POA: Diagnosis not present

## 2022-04-24 DIAGNOSIS — M9903 Segmental and somatic dysfunction of lumbar region: Secondary | ICD-10-CM | POA: Diagnosis not present

## 2022-04-24 DIAGNOSIS — M9904 Segmental and somatic dysfunction of sacral region: Secondary | ICD-10-CM | POA: Diagnosis not present

## 2022-04-24 DIAGNOSIS — M5417 Radiculopathy, lumbosacral region: Secondary | ICD-10-CM | POA: Diagnosis not present

## 2022-04-29 ENCOUNTER — Other Ambulatory Visit: Payer: Self-pay | Admitting: Internal Medicine

## 2022-05-01 DIAGNOSIS — M5417 Radiculopathy, lumbosacral region: Secondary | ICD-10-CM | POA: Diagnosis not present

## 2022-05-01 DIAGNOSIS — M9905 Segmental and somatic dysfunction of pelvic region: Secondary | ICD-10-CM | POA: Diagnosis not present

## 2022-05-01 DIAGNOSIS — M9904 Segmental and somatic dysfunction of sacral region: Secondary | ICD-10-CM | POA: Diagnosis not present

## 2022-05-01 DIAGNOSIS — M9903 Segmental and somatic dysfunction of lumbar region: Secondary | ICD-10-CM | POA: Diagnosis not present

## 2022-05-04 DIAGNOSIS — M9905 Segmental and somatic dysfunction of pelvic region: Secondary | ICD-10-CM | POA: Diagnosis not present

## 2022-05-04 DIAGNOSIS — M9903 Segmental and somatic dysfunction of lumbar region: Secondary | ICD-10-CM | POA: Diagnosis not present

## 2022-05-04 DIAGNOSIS — M5417 Radiculopathy, lumbosacral region: Secondary | ICD-10-CM | POA: Diagnosis not present

## 2022-05-04 DIAGNOSIS — M9904 Segmental and somatic dysfunction of sacral region: Secondary | ICD-10-CM | POA: Diagnosis not present

## 2022-05-08 DIAGNOSIS — M9903 Segmental and somatic dysfunction of lumbar region: Secondary | ICD-10-CM | POA: Diagnosis not present

## 2022-05-08 DIAGNOSIS — M5417 Radiculopathy, lumbosacral region: Secondary | ICD-10-CM | POA: Diagnosis not present

## 2022-05-08 DIAGNOSIS — M9905 Segmental and somatic dysfunction of pelvic region: Secondary | ICD-10-CM | POA: Diagnosis not present

## 2022-05-08 DIAGNOSIS — M9904 Segmental and somatic dysfunction of sacral region: Secondary | ICD-10-CM | POA: Diagnosis not present

## 2022-05-11 ENCOUNTER — Ambulatory Visit: Payer: Medicare HMO | Admitting: Internal Medicine

## 2022-05-15 DIAGNOSIS — M25551 Pain in right hip: Secondary | ICD-10-CM | POA: Diagnosis not present

## 2022-05-17 DIAGNOSIS — M9905 Segmental and somatic dysfunction of pelvic region: Secondary | ICD-10-CM | POA: Diagnosis not present

## 2022-05-17 DIAGNOSIS — M9903 Segmental and somatic dysfunction of lumbar region: Secondary | ICD-10-CM | POA: Diagnosis not present

## 2022-05-17 DIAGNOSIS — M9904 Segmental and somatic dysfunction of sacral region: Secondary | ICD-10-CM | POA: Diagnosis not present

## 2022-05-17 DIAGNOSIS — M5417 Radiculopathy, lumbosacral region: Secondary | ICD-10-CM | POA: Diagnosis not present

## 2022-05-18 ENCOUNTER — Other Ambulatory Visit: Payer: Self-pay | Admitting: Internal Medicine

## 2022-05-23 DIAGNOSIS — Z794 Long term (current) use of insulin: Secondary | ICD-10-CM | POA: Diagnosis not present

## 2022-05-23 DIAGNOSIS — M9905 Segmental and somatic dysfunction of pelvic region: Secondary | ICD-10-CM | POA: Diagnosis not present

## 2022-05-23 DIAGNOSIS — M9903 Segmental and somatic dysfunction of lumbar region: Secondary | ICD-10-CM | POA: Diagnosis not present

## 2022-05-23 DIAGNOSIS — R29898 Other symptoms and signs involving the musculoskeletal system: Secondary | ICD-10-CM | POA: Diagnosis not present

## 2022-05-23 DIAGNOSIS — F1920 Other psychoactive substance dependence, uncomplicated: Secondary | ICD-10-CM | POA: Diagnosis not present

## 2022-05-23 DIAGNOSIS — M9904 Segmental and somatic dysfunction of sacral region: Secondary | ICD-10-CM | POA: Diagnosis not present

## 2022-05-23 DIAGNOSIS — N181 Chronic kidney disease, stage 1: Secondary | ICD-10-CM | POA: Diagnosis not present

## 2022-05-23 DIAGNOSIS — E0822 Diabetes mellitus due to underlying condition with diabetic chronic kidney disease: Secondary | ICD-10-CM | POA: Diagnosis not present

## 2022-05-23 DIAGNOSIS — M5417 Radiculopathy, lumbosacral region: Secondary | ICD-10-CM | POA: Diagnosis not present

## 2022-05-23 DIAGNOSIS — G7000 Myasthenia gravis without (acute) exacerbation: Secondary | ICD-10-CM | POA: Diagnosis not present

## 2022-05-26 ENCOUNTER — Other Ambulatory Visit: Payer: Self-pay | Admitting: Internal Medicine

## 2022-05-30 DIAGNOSIS — M5417 Radiculopathy, lumbosacral region: Secondary | ICD-10-CM | POA: Diagnosis not present

## 2022-05-30 DIAGNOSIS — M9905 Segmental and somatic dysfunction of pelvic region: Secondary | ICD-10-CM | POA: Diagnosis not present

## 2022-05-30 DIAGNOSIS — M9903 Segmental and somatic dysfunction of lumbar region: Secondary | ICD-10-CM | POA: Diagnosis not present

## 2022-05-30 DIAGNOSIS — M9904 Segmental and somatic dysfunction of sacral region: Secondary | ICD-10-CM | POA: Diagnosis not present

## 2022-06-01 ENCOUNTER — Ambulatory Visit: Payer: Medicare HMO | Admitting: Internal Medicine

## 2022-06-03 ENCOUNTER — Other Ambulatory Visit: Payer: Self-pay | Admitting: Internal Medicine

## 2022-06-05 DIAGNOSIS — M25551 Pain in right hip: Secondary | ICD-10-CM | POA: Diagnosis not present

## 2022-06-06 ENCOUNTER — Telehealth: Payer: Self-pay | Admitting: Internal Medicine

## 2022-06-06 DIAGNOSIS — M9903 Segmental and somatic dysfunction of lumbar region: Secondary | ICD-10-CM | POA: Diagnosis not present

## 2022-06-06 DIAGNOSIS — M5417 Radiculopathy, lumbosacral region: Secondary | ICD-10-CM | POA: Diagnosis not present

## 2022-06-06 DIAGNOSIS — M9905 Segmental and somatic dysfunction of pelvic region: Secondary | ICD-10-CM | POA: Diagnosis not present

## 2022-06-06 DIAGNOSIS — M9904 Segmental and somatic dysfunction of sacral region: Secondary | ICD-10-CM | POA: Diagnosis not present

## 2022-06-06 MED ORDER — OLMESARTAN MEDOXOMIL-HCTZ 40-25 MG PO TABS: 1.0000 | ORAL_TABLET | Freq: Every day | ORAL | 0 refills | Status: DC

## 2022-06-06 NOTE — Telephone Encounter (Signed)
Pt is due for annual appt sent 30 day until appt.Marland KitchenRaechel Powell

## 2022-06-06 NOTE — Telephone Encounter (Signed)
Prescription Request  06/06/2022  LOV: 03/03/2022  What is the name of the medication or equipment? olmesartan-hydrochlorothiazide (BENICAR HCT) 40-25 MG tablet   Have you contacted your pharmacy to request a refill? Yes   Which pharmacy would you like this sent to?  [facility] [address] [place], [place] - [address] [place] [address] Phone: [phone] Fax: [phone]    Patient notified that their request is being sent to the clinical staff for review and that they should receive a response within 2 business days.   Please advise at [facility] [phone]

## 2022-06-12 DIAGNOSIS — M25551 Pain in right hip: Secondary | ICD-10-CM | POA: Diagnosis not present

## 2022-06-13 DIAGNOSIS — M9904 Segmental and somatic dysfunction of sacral region: Secondary | ICD-10-CM | POA: Diagnosis not present

## 2022-06-13 DIAGNOSIS — M9905 Segmental and somatic dysfunction of pelvic region: Secondary | ICD-10-CM | POA: Diagnosis not present

## 2022-06-13 DIAGNOSIS — M5417 Radiculopathy, lumbosacral region: Secondary | ICD-10-CM | POA: Diagnosis not present

## 2022-06-13 DIAGNOSIS — M9903 Segmental and somatic dysfunction of lumbar region: Secondary | ICD-10-CM | POA: Diagnosis not present

## 2022-06-16 DIAGNOSIS — M5417 Radiculopathy, lumbosacral region: Secondary | ICD-10-CM | POA: Diagnosis not present

## 2022-06-16 DIAGNOSIS — M9904 Segmental and somatic dysfunction of sacral region: Secondary | ICD-10-CM | POA: Diagnosis not present

## 2022-06-16 DIAGNOSIS — M9903 Segmental and somatic dysfunction of lumbar region: Secondary | ICD-10-CM | POA: Diagnosis not present

## 2022-06-16 DIAGNOSIS — M9905 Segmental and somatic dysfunction of pelvic region: Secondary | ICD-10-CM | POA: Diagnosis not present

## 2022-06-19 DIAGNOSIS — M25551 Pain in right hip: Secondary | ICD-10-CM | POA: Diagnosis not present

## 2022-06-20 DIAGNOSIS — M9904 Segmental and somatic dysfunction of sacral region: Secondary | ICD-10-CM | POA: Diagnosis not present

## 2022-06-20 DIAGNOSIS — M9903 Segmental and somatic dysfunction of lumbar region: Secondary | ICD-10-CM | POA: Diagnosis not present

## 2022-06-20 DIAGNOSIS — M9905 Segmental and somatic dysfunction of pelvic region: Secondary | ICD-10-CM | POA: Diagnosis not present

## 2022-06-20 DIAGNOSIS — M5417 Radiculopathy, lumbosacral region: Secondary | ICD-10-CM | POA: Diagnosis not present

## 2022-06-22 DIAGNOSIS — M5417 Radiculopathy, lumbosacral region: Secondary | ICD-10-CM | POA: Diagnosis not present

## 2022-06-22 DIAGNOSIS — M9905 Segmental and somatic dysfunction of pelvic region: Secondary | ICD-10-CM | POA: Diagnosis not present

## 2022-06-22 DIAGNOSIS — M9904 Segmental and somatic dysfunction of sacral region: Secondary | ICD-10-CM | POA: Diagnosis not present

## 2022-06-22 DIAGNOSIS — M9903 Segmental and somatic dysfunction of lumbar region: Secondary | ICD-10-CM | POA: Diagnosis not present

## 2022-06-23 ENCOUNTER — Other Ambulatory Visit: Payer: Self-pay

## 2022-06-26 DIAGNOSIS — M25551 Pain in right hip: Secondary | ICD-10-CM | POA: Diagnosis not present

## 2022-06-27 DIAGNOSIS — M9903 Segmental and somatic dysfunction of lumbar region: Secondary | ICD-10-CM | POA: Diagnosis not present

## 2022-06-27 DIAGNOSIS — M5417 Radiculopathy, lumbosacral region: Secondary | ICD-10-CM | POA: Diagnosis not present

## 2022-06-27 DIAGNOSIS — M9905 Segmental and somatic dysfunction of pelvic region: Secondary | ICD-10-CM | POA: Diagnosis not present

## 2022-06-27 DIAGNOSIS — M9904 Segmental and somatic dysfunction of sacral region: Secondary | ICD-10-CM | POA: Diagnosis not present

## 2022-07-02 ENCOUNTER — Other Ambulatory Visit: Payer: Self-pay | Admitting: Internal Medicine

## 2022-07-03 ENCOUNTER — Other Ambulatory Visit: Payer: Self-pay

## 2022-07-03 DIAGNOSIS — M25551 Pain in right hip: Secondary | ICD-10-CM | POA: Diagnosis not present

## 2022-07-05 DIAGNOSIS — M9904 Segmental and somatic dysfunction of sacral region: Secondary | ICD-10-CM | POA: Diagnosis not present

## 2022-07-05 DIAGNOSIS — M9903 Segmental and somatic dysfunction of lumbar region: Secondary | ICD-10-CM | POA: Diagnosis not present

## 2022-07-05 DIAGNOSIS — M9905 Segmental and somatic dysfunction of pelvic region: Secondary | ICD-10-CM | POA: Diagnosis not present

## 2022-07-05 DIAGNOSIS — M5417 Radiculopathy, lumbosacral region: Secondary | ICD-10-CM | POA: Diagnosis not present

## 2022-07-17 DIAGNOSIS — M25551 Pain in right hip: Secondary | ICD-10-CM | POA: Diagnosis not present

## 2022-07-31 DIAGNOSIS — M75121 Complete rotator cuff tear or rupture of right shoulder, not specified as traumatic: Secondary | ICD-10-CM | POA: Diagnosis not present

## 2022-07-31 DIAGNOSIS — M75112 Incomplete rotator cuff tear or rupture of left shoulder, not specified as traumatic: Secondary | ICD-10-CM | POA: Diagnosis not present

## 2022-07-31 DIAGNOSIS — M25551 Pain in right hip: Secondary | ICD-10-CM | POA: Diagnosis not present

## 2022-08-07 DIAGNOSIS — R053 Chronic cough: Secondary | ICD-10-CM | POA: Diagnosis not present

## 2022-08-07 DIAGNOSIS — R490 Dysphonia: Secondary | ICD-10-CM | POA: Diagnosis not present

## 2022-08-07 DIAGNOSIS — J310 Chronic rhinitis: Secondary | ICD-10-CM | POA: Diagnosis not present

## 2022-08-16 DIAGNOSIS — M25551 Pain in right hip: Secondary | ICD-10-CM | POA: Diagnosis not present

## 2022-08-20 ENCOUNTER — Other Ambulatory Visit: Payer: Self-pay | Admitting: Internal Medicine

## 2022-08-21 ENCOUNTER — Other Ambulatory Visit: Payer: Self-pay

## 2022-08-28 ENCOUNTER — Other Ambulatory Visit: Payer: Self-pay | Admitting: Internal Medicine

## 2022-08-29 ENCOUNTER — Ambulatory Visit (INDEPENDENT_AMBULATORY_CARE_PROVIDER_SITE_OTHER): Payer: Medicare HMO | Admitting: Internal Medicine

## 2022-08-29 ENCOUNTER — Encounter: Payer: Self-pay | Admitting: Internal Medicine

## 2022-08-29 VITALS — BP 130/82 | HR 82 | Temp 98.2°F | Ht 72.0 in | Wt 236.0 lb

## 2022-08-29 DIAGNOSIS — Z0000 Encounter for general adult medical examination without abnormal findings: Secondary | ICD-10-CM | POA: Diagnosis not present

## 2022-08-29 DIAGNOSIS — E559 Vitamin D deficiency, unspecified: Secondary | ICD-10-CM | POA: Diagnosis not present

## 2022-08-29 DIAGNOSIS — Z794 Long term (current) use of insulin: Secondary | ICD-10-CM

## 2022-08-29 DIAGNOSIS — E538 Deficiency of other specified B group vitamins: Secondary | ICD-10-CM | POA: Diagnosis not present

## 2022-08-29 DIAGNOSIS — N182 Chronic kidney disease, stage 2 (mild): Secondary | ICD-10-CM

## 2022-08-29 DIAGNOSIS — Z125 Encounter for screening for malignant neoplasm of prostate: Secondary | ICD-10-CM | POA: Diagnosis not present

## 2022-08-29 DIAGNOSIS — I10 Essential (primary) hypertension: Secondary | ICD-10-CM | POA: Diagnosis not present

## 2022-08-29 DIAGNOSIS — Z0001 Encounter for general adult medical examination with abnormal findings: Secondary | ICD-10-CM

## 2022-08-29 DIAGNOSIS — L918 Other hypertrophic disorders of the skin: Secondary | ICD-10-CM | POA: Insufficient documentation

## 2022-08-29 DIAGNOSIS — E119 Type 2 diabetes mellitus without complications: Secondary | ICD-10-CM

## 2022-08-29 DIAGNOSIS — E7800 Pure hypercholesterolemia, unspecified: Secondary | ICD-10-CM | POA: Diagnosis not present

## 2022-08-29 DIAGNOSIS — H6121 Impacted cerumen, right ear: Secondary | ICD-10-CM

## 2022-08-29 LAB — CBC WITH DIFFERENTIAL/PLATELET
Basophils Absolute: 0 10*3/uL (ref 0.0–0.1)
Basophils Relative: 0.4 % (ref 0.0–3.0)
Eosinophils Absolute: 0.1 10*3/uL (ref 0.0–0.7)
Eosinophils Relative: 0.8 % (ref 0.0–5.0)
HCT: 44.2 % (ref 39.0–52.0)
Hemoglobin: 14.7 g/dL (ref 13.0–17.0)
Lymphocytes Relative: 38.9 % (ref 12.0–46.0)
Lymphs Abs: 2.4 10*3/uL (ref 0.7–4.0)
MCHC: 33.2 g/dL (ref 30.0–36.0)
MCV: 94.7 fl (ref 78.0–100.0)
Monocytes Absolute: 0.6 10*3/uL (ref 0.1–1.0)
Monocytes Relative: 9.4 % (ref 3.0–12.0)
Neutro Abs: 3.1 10*3/uL (ref 1.4–7.7)
Neutrophils Relative %: 50.5 % (ref 43.0–77.0)
Platelets: 294 10*3/uL (ref 150.0–400.0)
RBC: 4.66 Mil/uL (ref 4.22–5.81)
RDW: 14.1 % (ref 11.5–15.5)
WBC: 6.1 10*3/uL (ref 4.0–10.5)

## 2022-08-29 LAB — URINALYSIS, ROUTINE W REFLEX MICROSCOPIC
Bilirubin Urine: NEGATIVE
Hgb urine dipstick: NEGATIVE
Ketones, ur: NEGATIVE
Leukocytes,Ua: NEGATIVE
Nitrite: NEGATIVE
RBC / HPF: NONE SEEN (ref 0–?)
Specific Gravity, Urine: <=1.005 — AB (ref 1.000–1.030)
Total Protein, Urine: NEGATIVE
Urine Glucose: NEGATIVE
Urobilinogen, UA: 0.2 (ref 0.0–1.0)
WBC, UA: NONE SEEN (ref 0–?)
pH: 6.5 (ref 5.0–8.0)

## 2022-08-29 LAB — HEPATIC FUNCTION PANEL
ALT: 38 U/L (ref 0–53)
AST: 34 U/L (ref 0–37)
Albumin: 4.2 g/dL (ref 3.5–5.2)
Alkaline Phosphatase: 41 U/L (ref 39–117)
Bilirubin, Direct: 0.1 mg/dL (ref 0.0–0.3)
Total Bilirubin: 0.7 mg/dL (ref 0.2–1.2)
Total Protein: 6.9 g/dL (ref 6.0–8.3)

## 2022-08-29 LAB — BASIC METABOLIC PANEL
BUN: 13 mg/dL (ref 6–23)
CO2: 33 mEq/L — ABNORMAL HIGH (ref 19–32)
Calcium: 10 mg/dL (ref 8.4–10.5)
Chloride: 98 mEq/L (ref 96–112)
Creatinine, Ser: 1.16 mg/dL (ref 0.40–1.50)
GFR: 66.88 mL/min (ref >60.00)
Glucose, Bld: 81 mg/dL (ref 70–99)
Potassium: 3.2 mEq/L — ABNORMAL LOW (ref 3.5–5.1)
Sodium: 139 mEq/L (ref 135–145)

## 2022-08-29 LAB — LIPID PANEL
Cholesterol: 188 mg/dL (ref 0–200)
HDL: 62.3 mg/dL (ref >39.00)
LDL Cholesterol: 106 mg/dL — ABNORMAL HIGH (ref 0–99)
NonHDL: 126.02
Total CHOL/HDL Ratio: 3
Triglycerides: 102 mg/dL (ref 0.0–149.0)
VLDL: 20.4 mg/dL (ref 0.0–40.0)

## 2022-08-29 LAB — VITAMIN D 25 HYDROXY (VIT D DEFICIENCY, FRACTURES): VITD: 67.19 ng/mL (ref 30.00–100.00)

## 2022-08-29 LAB — PSA: PSA: 1.98 ng/mL (ref 0.10–4.00)

## 2022-08-29 LAB — MICROALBUMIN / CREATININE URINE RATIO
Creatinine,U: 26.7 mg/dL
Microalb Creat Ratio: 2.6 mg/g (ref 0.0–30.0)
Microalb, Ur: <0.7 mg/dL (ref 0.0–1.9)

## 2022-08-29 LAB — VITAMIN B12: Vitamin B-12: 610 pg/mL (ref 211–911)

## 2022-08-29 LAB — TSH: TSH: 0.78 u[IU]/mL (ref 0.35–5.50)

## 2022-08-29 LAB — HEMOGLOBIN A1C: Hgb A1c MFr Bld: 6.9 % — ABNORMAL HIGH (ref 4.6–6.5)

## 2022-08-29 MED ORDER — TIRZEPATIDE 5 MG/0.5ML ~~LOC~~ SOAJ: 5.0000 mg | SUBCUTANEOUS | 3 refills | Status: DC

## 2022-08-29 MED ORDER — CYCLOBENZAPRINE HCL 5 MG PO TABS: 5.0000 mg | ORAL_TABLET | Freq: Three times a day (TID) | ORAL | 2 refills | Status: DC | PRN

## 2022-08-29 NOTE — Progress Notes (Signed)
Patient ID: Miguel Powell, male   DOB: August 07, 1960, 62 y.o.   MRN: [id number]         Chief Complaint:: wellness exam and dm, right cerumen impaction, low vitd, hld, htn, ckd3a       HPI:  Miguel Powell is a 62 y.o. male here for wellness exam; declines covid booster, for tdap and shignrix at pharmacy, pt plans to call for eye exam soon, o/ up to date                         Also s/p coritsone several last yr per ortho with elevated BS, last A1c 10.5, so no more coritsone for right rot cuff tears for now per ortho. Pt denies chest pain, increased sob or doe, wheezing, orthopnea, PND, increased LE swelling, palpitations, dizziness or syncope.   Pt denies polydipsia, polyuria, or new focal neuro s/s.    Pt denies fever, wt loss, night sweats, loss of appetite, or other constitutional symptoms    Wt Readings from Last 3 Encounters:  08/29/22 236 lb (107 kg)  03/03/22 242 lb 12.8 oz (110.1 kg)  08/09/21 235 lb (106.6 kg)   BP Readings from Last 3 Encounters:  08/29/22 130/82  03/03/22 128/78  08/09/21 134/78   Immunization History  Administered Date(s) Administered   PFIZER(Purple Top)SARS-COV-2 Vaccination 08/21/2019, 09/16/2019, 03/16/2020   PPD Test 08/12/2018   Pneumococcal Conjugate-13 08/12/2018   Tdap 10/30/2011   Health Maintenance Due  Topic Date Due   Medicare Annual Wellness (AWV)  Never done   Zoster Vaccines- Shingrix (1 of 2) Never done   OPHTHALMOLOGY EXAM  08/05/2019   COVID-19 Vaccine (4 - 2023-24 season) 10/14/2021   DTaP/Tdap/Td (2 - Td or Tdap) 10/29/2021      Past Medical History:  Diagnosis Date   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 since 1990s   Myasthenia gravis (HCC)    Sinus drainage 02/03/2016   Wears partial dentures    upper    Past Surgical History:  Procedure Laterality Date   LASER ABLATION OF CONDYLOMAS  11/04/2009   LUMBAR  SPINE SURGERY  1986   MINOR FULGERATION OF ANAL CONDYLOMA N/A 02/10/2016   Procedure: EXCISION AND FULGURATION OF ANAL CONDYLOMA;  Surgeon: Avel Peace, MD;  Location: [facility];  Service: General;  Laterality: N/A;    reports that he has never smoked. He has never used smokeless tobacco. He reports current alcohol use. He reports that he does not use drugs. family history includes Aneurysm (age of onset: 23) in his brother; Cancer in his sister; Diabetes in his mother and sister; Hypertension in his sister. Allergies  Allergen Reactions   Azithromycin Other (See Comments)    DUE TO MYASTHENIA GRAVIS   Botox [Botulinum Toxin Type A] Other (See Comments)    DUE TO MYASTHENIA GRAVIS   Gentamycin [Gentamicin] Other (See Comments)    DUE TO MYASTHENIA GRAVIS   Ketek [Telithromycin] Other (See Comments)    DUE TO MYASTHENIA GRAVIS   Levaquin [Levofloxacin In D5w] Other (See Comments)    DUE TO MYASTHENIA GRAVIS   Magnesium-Containing Compounds Other (See Comments)    DUE TO MYASTHENIA GRAVIS   Neomycin Other (See Comments)    DUE TO MYASTHENIA GRAVIS   Penicillins Other (See Comments)    DUE TO MYASTHENIA GRAVIS  Procainamide Other (See Comments)    DUE TO MYASTHENIA GRAVIS   Quinine Derivatives Other (See Comments)    DUE TO MYASTHENIA GRAVIS   Current Outpatient Medications on File Prior to Visit  Medication Sig Dispense Refill   ALPHA LIPOIC ACID PO Take by mouth.     amLODipine (NORVASC) 5 MG tablet Take 1 tablet by mouth once daily 90 tablet 1   azaTHIOprine (IMURAN) 50 MG tablet Take 200 mg by mouth daily.     azelastine (ASTELIN) 0.1 % nasal spray SPRAY TWO SPRAYS IN EACH NOSTRIL TWICE DAILY AS DIRECTED 30 mL 5   azelastine (ASTELIN) 0.1 % nasal spray Azelastine HCl     calcium-vitamin D (OSCAL-500) 500-400 MG-UNIT tablet Take 1 tablet by mouth daily. 500-600     CHROMIUM PO Take by mouth.     Cranberry-Milk Thistle (LIVER & KIDNEY CLEANSER PO) Take by  mouth.     diphenhydrAMINE (BENADRYL) 25 MG tablet Take 25 mg by mouth every 6 (six) hours as needed for sleep.      doxycycline (VIBRA-TABS) 100 MG tablet Take 1 tablet (100 mg total) by mouth 2 (two) times daily. 20 tablet 0   metFORMIN (GLUCOPHAGE-XR) 500 MG 24 hr tablet Take 1 tablet by mouth once daily with breakfast 90 tablet 0   Misc Natural Products (HEALTHY LIVER PO) Take by mouth.     Multiple Vitamin (MULTIVITAMIN) tablet Take 1 tablet by mouth daily.     NON FORMULARY Adult stem cell activator     NON FORMULARY PC brain and liver     olmesartan-hydrochlorothiazide (BENICAR HCT) 40-25 MG tablet Take 1 tablet by mouth once daily 90 tablet 3   predniSONE (DELTASONE) 2.5 MG tablet      pyridostigmine (MESTINON) 60 MG tablet Take 60 mg by mouth 4 (four) times daily.     ranitidine (ZANTAC) 150 MG tablet Take 1 tablet (150 mg total) by mouth 2 (two) times daily. 180 tablet 3   RESVERATROL PO Take 1 each by mouth daily.     simethicone (GAS-X) 80 MG chewable tablet Chew 1 tablet (80 mg total) by mouth every 6 (six) hours as needed for flatulence. 30 tablet 0   UNABLE TO FIND Take by mouth.     No current facility-administered medications on file prior to visit.        ROS:  All others reviewed and negative.  Objective        PE:  BP 130/82 (BP Location: Right Arm, Patient Position: Sitting, Cuff Size: Normal)   Pulse 82   Temp 98.2 F (36.8 C) (Oral)   Ht 6' (1.829 m)   Wt 236 lb (107 kg)   SpO2 98%   BMI 32.01 kg/m                 Constitutional: Pt appears in NAD               HENT: Head: NCAT.                Right Ear: External ear normal.  Right cerumen impaction resolved with irrigation               Left Ear: External ear normal.                Eyes: . Pupils are equal, round, and reactive to light. Conjunctivae and EOM are normal               Nose: without d/c  or deformity               Neck: Neck supple. Gross normal ROM               Cardiovascular: Normal rate  and regular rhythm.                 Pulmonary/Chest: Effort normal and breath sounds without rales or wheezing.                Abd:  Soft, NT, ND, + BS, no organomegaly               Neurological: Pt is alert. At baseline orientation, motor grossly intact               Skin: Skin is warm. No rashes, no other new lesions, LE edema - none               Psychiatric: Pt behavior is normal without agitation   Micro: none  Cardiac tracings I have personally interpreted today:  none  Pertinent Radiological findings (summarize): none   Lab Results  Component Value Date   WBC 6.1 08/29/2022   HGB 14.7 08/29/2022   HCT 44.2 08/29/2022   PLT 294.0 08/29/2022   GLUCOSE 81 08/29/2022   CHOL 188 08/29/2022   TRIG 102.0 08/29/2022   HDL 62.30 08/29/2022   LDLCALC 106 (H) 08/29/2022   ALT 38 08/29/2022   AST 34 08/29/2022   NA 139 08/29/2022   K 3.2 (L) 08/29/2022   CL 98 08/29/2022   CREATININE 1.16 08/29/2022   BUN 13 08/29/2022   CO2 33 (H) 08/29/2022   TSH 0.78 08/29/2022   PSA 1.98 08/29/2022   HGBA1C 6.9 (H) 08/29/2022   MICROALBUR <0.7 08/29/2022   Assessment/Plan:  Miguel Powell is a 62 y.o. Black or African American [2] male with  has a past medical history of Anal condyloma (01/2016), Arthritis, HLD (hyperlipidemia) (05/03/2018), Hypertension, Myasthenia gravis (HCC), Sinus drainage (02/03/2016), and Wears partial dentures.  Encounter for well adult exam with abnormal findings Age and sex appropriate education and counseling updated with regular exercise and diet Referrals for preventative services - pt to call for eye exam soon Immunizations addressed - declines covid booster, for tdap and shingrix at pharmacy Smoking counseling  - none needed Evidence for depression or other mood disorder - none significant Most recent labs reviewed. I have personally reviewed and have noted: 1) the patient's medical and social history 2) The patient's current medications and  supplements 3) The patient's height, weight, and BMI have been recorded in the chart   CKD (chronic kidney disease), stage II Lab Results  Component Value Date   CREATININE 1.16 08/29/2022   Stable overall, cont to avoid nephrotoxins   Essential hypertension BP Readings from Last 3 Encounters:  08/29/22 130/82  03/03/22 128/78  08/09/21 134/78   Stable, pt to continue medical treatment norvasc 5 every day,    HLD (hyperlipidemia) Lab Results  Component Value Date   LDLCALC 106 (H) 08/29/2022   Uncontrolled,, pt to continue lower chol diet, declines statin for now   Type 2 diabetes mellitus without complications (HCC) Lab Results  Component Value Date   HGBA1C 6.9 (H) 08/29/2022   Uncontrolled, pt to continue current medical treatment metformin ER 500 mg - 1 every day, also increased mounjaro 5 mg weekly   Vitamin D deficiency Last vitamin D Lab Results  Component Value Date   VD25OH 67.19 08/29/2022   Stable,  cont oral replacement   Impacted cerumen of right ear Ceruminosis is noted.  Wax is removed by syringing and manual debridement. Instructions for home care to prevent wax buildup are given.  Followup: No follow-ups on file.  Oliver Barre, MD 09/01/2022 8:36 PM [facility]

## 2022-08-29 NOTE — Patient Instructions (Signed)
Your right ear was irrigated today  Ok to increase the mounjaro to 5 mg weekly  Please continue all other medications as before, and refills have been done if requested.  Please have the pharmacy call with any other refills you may need.  Please continue your efforts at being more active, low cholesterol diet, and weight control.  You are otherwise up to date with prevention measures today.  Please keep your appointments with your specialists as you may have planned  Please go to the LAB at the blood drawing area for the tests to be done  You will be contacted by phone if any changes need to be made immediately.  Otherwise, you will receive a letter about your results with an explanation, but please check with MyChart first.  Please make an Appointment to return in 6 months, or sooner if needed

## 2022-09-01 DIAGNOSIS — H6121 Impacted cerumen, right ear: Secondary | ICD-10-CM | POA: Insufficient documentation

## 2022-09-01 NOTE — Assessment & Plan Note (Signed)
Age and sex appropriate education and counseling updated with regular exercise and diet Referrals for preventative services - pt to call for eye exam soon Immunizations addressed - declines covid booster, for tdap and shingrix at pharmacy Smoking counseling  - none needed Evidence for depression or other mood disorder - none significant Most recent labs reviewed. I have personally reviewed and have noted: 1) the patient's medical and social history 2) The patient's current medications and supplements 3) The patient's height, weight, and BMI have been recorded in the chart

## 2022-09-01 NOTE — Assessment & Plan Note (Signed)
Lab Results  Component Value Date   CREATININE 1.16 08/29/2022   Stable overall, cont to avoid nephrotoxins

## 2022-09-01 NOTE — Assessment & Plan Note (Signed)
Lab Results  Component Value Date   HGBA1C 6.9 (H) 08/29/2022   Uncontrolled, pt to continue current medical treatment metformin ER 500 mg - 1 every day, also increased mounjaro 5 mg weekly

## 2022-09-01 NOTE — Assessment & Plan Note (Signed)
Last vitamin D Lab Results  Component Value Date   VD25OH 67.19 08/29/2022   Stable, cont oral replacement

## 2022-09-01 NOTE — Assessment & Plan Note (Signed)
Lab Results  Component Value Date   LDLCALC 106 (H) 08/29/2022   Uncontrolled,, pt to continue lower chol diet, declines statin for now

## 2022-09-01 NOTE — Assessment & Plan Note (Signed)
Ceruminosis is noted.  Wax is removed by syringing and manual debridement. Instructions for home care to prevent wax buildup are given.  

## 2022-09-01 NOTE — Assessment & Plan Note (Signed)
BP Readings from Last 3 Encounters:  08/29/22 130/82  03/03/22 128/78  08/09/21 134/78   Stable, pt to continue medical treatment norvasc 5 every day,

## 2022-09-05 DIAGNOSIS — M25551 Pain in right hip: Secondary | ICD-10-CM | POA: Diagnosis not present

## 2022-09-06 DIAGNOSIS — K760 Fatty (change of) liver, not elsewhere classified: Secondary | ICD-10-CM | POA: Diagnosis not present

## 2022-09-13 ENCOUNTER — Encounter (INDEPENDENT_AMBULATORY_CARE_PROVIDER_SITE_OTHER): Payer: Self-pay

## 2022-09-27 DIAGNOSIS — G7000 Myasthenia gravis without (acute) exacerbation: Secondary | ICD-10-CM | POA: Diagnosis not present

## 2022-09-27 DIAGNOSIS — Z79624 Long term (current) use of inhibitors of nucleotide synthesis: Secondary | ICD-10-CM | POA: Diagnosis not present

## 2022-09-27 DIAGNOSIS — M5416 Radiculopathy, lumbar region: Secondary | ICD-10-CM | POA: Diagnosis not present

## 2022-09-27 DIAGNOSIS — Z5181 Encounter for therapeutic drug level monitoring: Secondary | ICD-10-CM | POA: Diagnosis not present

## 2022-09-28 ENCOUNTER — Ambulatory Visit: Payer: Medicare HMO

## 2022-10-02 DIAGNOSIS — R051 Acute cough: Secondary | ICD-10-CM | POA: Diagnosis not present

## 2022-10-02 DIAGNOSIS — U071 COVID-19: Secondary | ICD-10-CM | POA: Diagnosis not present

## 2022-10-03 ENCOUNTER — Telehealth: Payer: Self-pay | Admitting: Internal Medicine

## 2022-10-03 NOTE — Telephone Encounter (Signed)
Pt called wanting to know if he can take his amlodipine with a medication for COVID but I don't see it on his list. Please advise.

## 2022-10-03 NOTE — Telephone Encounter (Signed)
Ok to say yes, but please add the amlodipine to his med list and ask who prescribed it, thanks

## 2022-10-03 NOTE — Telephone Encounter (Signed)
Error

## 2022-10-04 NOTE — Telephone Encounter (Signed)
The medication is already on his list, and I called and let the Pt know.

## 2022-10-11 ENCOUNTER — Telehealth: Payer: Self-pay | Admitting: Internal Medicine

## 2022-10-11 ENCOUNTER — Other Ambulatory Visit: Payer: Self-pay | Admitting: Internal Medicine

## 2022-10-11 NOTE — Telephone Encounter (Signed)
Patient said he took his last pill today.   Prescription Request  10/11/2022  LOV: 08/29/2022  What is the name of the medication or equipment? olmesartan-hydrochlorothiazide (BENICAR HCT) 40-25 MG tablet   Have you contacted your pharmacy to request a refill? Yes   Which pharmacy would you like this sent to?  [facility] [address] [place], [place] - [address] [place] [address] Phone: [phone] Fax: [phone]    Patient notified that their request is being sent to the clinical staff for review and that they should receive a response within 2 business days.   Please advise at Mobile [phone] (mobile)

## 2022-10-11 NOTE — Telephone Encounter (Signed)
Refill too soon

## 2022-10-13 ENCOUNTER — Other Ambulatory Visit: Payer: Self-pay | Admitting: Internal Medicine

## 2022-10-13 ENCOUNTER — Ambulatory Visit: Payer: Medicare HMO | Admitting: *Deleted

## 2022-10-13 VITALS — BP 116/74 | HR 84 | Temp 98.2°F | Ht 72.0 in | Wt 232.2 lb

## 2022-10-13 DIAGNOSIS — Z0000 Encounter for general adult medical examination without abnormal findings: Secondary | ICD-10-CM | POA: Diagnosis not present

## 2022-10-13 MED ORDER — OLMESARTAN MEDOXOMIL-HCTZ 40-25 MG PO TABS: 1.0000 | ORAL_TABLET | Freq: Every day | ORAL | 3 refills | Status: DC

## 2022-10-13 NOTE — Patient Instructions (Addendum)
Miguel Powell , Thank you for taking time to come for your Medicare Wellness Visit. I appreciate your ongoing commitment to your health goals. Please review the following plan we discussed and let me know if I can assist you in the future.   These are the goals we discussed:  Goals   None     This is a list of the screening recommended for you and due dates:  Health Maintenance  Topic Date Due   Eye exam for diabetics  08/05/2019   COVID-19 Vaccine (4 - 2023-24 season) 10/14/2021   Flu Shot  05/14/2023*   Zoster (Shingles) Vaccine (1 of 2) 07/13/2023*   Hemoglobin A1C  03/01/2023   Complete foot exam   03/04/2023   Yearly kidney function blood test for diabetes  08/29/2023   Yearly kidney health urinalysis for diabetes  08/29/2023   Medicare Annual Wellness Visit  10/13/2023   Colon Cancer Screening  04/26/2025   Hepatitis C Screening  Completed   HIV Screening  Completed   HPV Vaccine  Aged Out   DTaP/Tdap/Td vaccine  Discontinued  *Topic was postponed. The date shown is not the original due date.     Health Maintenance, Male Adopting a healthy lifestyle and getting preventive care are important in promoting health and wellness. Ask your health care provider about: The right schedule for you to have regular tests and exams. Things you can do on your own to prevent diseases and keep yourself healthy. What should I know about diet, weight, and exercise? Eat a healthy diet  Eat a diet that includes plenty of vegetables, fruits, low-fat dairy products, and lean protein. Do not eat a lot of foods that are high in solid fats, added sugars, or sodium. Maintain a healthy weight Body mass index (BMI) is a measurement that can be used to identify possible weight problems. It estimates body fat based on height and weight. Your health care provider can help determine your BMI and help you achieve or maintain a healthy weight. Get regular exercise Get regular exercise. This is one of the  most important things you can do for your health. Most adults should: Exercise for at least 150 minutes each week. The exercise should increase your heart rate and make you sweat (moderate-intensity exercise). Do strengthening exercises at least twice a week. This is in addition to the moderate-intensity exercise. Spend less time sitting. Even light physical activity can be beneficial. Watch cholesterol and blood lipids Have your blood tested for lipids and cholesterol at 64 years of age, then have this test every 5 years. You may need to have your cholesterol levels checked more often if: Your lipid or cholesterol levels are high. You are older than 64 years of age. You are at high risk for heart disease. What should I know about cancer screening? Many types of cancers can be detected early and may often be prevented. Depending on your health history and family history, you may need to have cancer screening at various ages. This may include screening for: Colorectal cancer. Prostate cancer. Skin cancer. Lung cancer. What should I know about heart disease, diabetes, and high blood pressure? Blood pressure and heart disease High blood pressure causes heart disease and increases the risk of stroke. This is more likely to develop in people who have high blood pressure readings or are overweight. Talk with your health care provider about your target blood pressure readings. Have your blood pressure checked: Every 3-5 years if you are 18-39  years of age. Every year if you are 47 years old or older. If you are between the ages of 73 and 49 and are a current or former smoker, ask your health care provider if you should have a one-time screening for abdominal aortic aneurysm (AAA). Diabetes Have regular diabetes screenings. This checks your fasting blood sugar level. Have the screening done: Once every three years after age 69 if you are at a normal weight and have a low risk for diabetes. More  often and at a younger age if you are overweight or have a high risk for diabetes. What should I know about preventing infection? Hepatitis B If you have a higher risk for hepatitis B, you should be screened for this virus. Talk with your health care provider to find out if you are at risk for hepatitis B infection. Hepatitis C Blood testing is recommended for: Everyone born from [birth year] through 1967. Anyone with known risk factors for hepatitis C. Sexually transmitted infections (STIs) You should be screened each year for STIs, including gonorrhea and chlamydia, if: You are sexually active and are younger than 64 years of age. You are older than 64 years of age and your health care provider tells you that you are at risk for this type of infection. Your sexual activity has changed since you were last screened, and you are at increased risk for chlamydia or gonorrhea. Ask your health care provider if you are at risk. Ask your health care provider about whether you are at high risk for HIV. Your health care provider may recommend a prescription medicine to help prevent HIV infection. If you choose to take medicine to prevent HIV, you should first get tested for HIV. You should then be tested every 3 months for as long as you are taking the medicine. Follow these instructions at home: Alcohol use Do not drink alcohol if your health care provider tells you not to drink. If you drink alcohol: Limit how much you have to 0-2 drinks a day. Know how much alcohol is in your drink. In the [place] one drink equals one 12 oz bottle of beer (355 mL), one 5 oz glass of wine (148 mL), or one 1 oz glass of hard liquor (44 mL). Lifestyle Do not use any products that contain nicotine or tobacco. These products include cigarettes, chewing tobacco, and vaping devices, such as e-cigarettes. If you need help quitting, ask your health care provider. Do not use street drugs. Do not share needles. Ask your health care  provider for help if you need support or information about quitting drugs. General instructions Schedule regular health, dental, and eye exams. Stay current with your vaccines. Tell your health care provider if: You often feel depressed. You have ever been abused or do not feel safe at home. Summary Adopting a healthy lifestyle and getting preventive care are important in promoting health and wellness. Follow your health care provider's instructions about healthy diet, exercising, and getting tested or screened for diseases. Follow your health care provider's instructions on monitoring your cholesterol and blood pressure. This information is not intended to replace advice given to you by your health care provider. Make sure you discuss any questions you have with your health care provider. Document Revised: 06/21/2020 Document Reviewed: 06/21/2020 Elsevier Patient Education  2024 [employer].

## 2022-10-13 NOTE — Progress Notes (Signed)
Subjective:   Miguel Powell is a 62 y.o. male who presents for an Initial Medicare Annual Wellness Visit.  Visit Complete: In person  Patient Medicare AWV questionnaire was  not completed by the patient.  Review of Systems    Defer to PCP Cardiac Risk Factors include: advanced age (>80men, >30 women);sedentary lifestyle;hypertension;male gender    Please see problem list for additional risk factors Objective:    Today's Vitals   10/13/22 1432  BP: 116/74  Pulse: 84  Temp: 98.2 F (36.8 C)  TempSrc: Oral  SpO2: 96%  Weight: 232 lb 4 oz (105.3 kg)  Height: 6' (1.829 m)   Body mass index is 31.5 kg/m.     10/13/2022    2:43 PM 06/03/2019    2:20 PM 05/12/2016    3:33 PM 02/10/2016    1:43 PM 02/03/2016    3:51 PM  Advanced Directives  Does Patient Have a Medical Advance Directive? No No No No No  Does patient want to make changes to medical advance directive? No - Patient declined      Would patient like information on creating a medical advance directive? No - Patient declined Yes (MAU/Ambulatory/Procedural Areas - Information given)  No - Patient declined No - Patient declined    Current Medications (verified) Outpatient Encounter Medications as of 10/13/2022  Medication Sig   amLODipine (NORVASC) 5 MG tablet Take 1 tablet by mouth once daily   azaTHIOprine (IMURAN) 50 MG tablet Take 200 mg by mouth daily.   azelastine (ASTELIN) 0.1 % nasal spray SPRAY TWO SPRAYS IN EACH NOSTRIL TWICE DAILY AS DIRECTED   calcium-vitamin D (OSCAL-500) 500-400 MG-UNIT tablet Take 1 tablet by mouth daily. 500-600   CHROMIUM PO Take by mouth.   Cranberry-Milk Thistle (LIVER & KIDNEY CLEANSER PO) Take by mouth.   cyclobenzaprine (FLEXERIL) 5 MG tablet Take 1 tablet (5 mg total) by mouth 3 (three) times daily as needed for muscle spasms.   diphenhydrAMINE (BENADRYL) 25 MG tablet Take 25 mg by mouth every 6 (six) hours as needed for sleep.    metFORMIN (GLUCOPHAGE-XR) 500 MG 24 hr tablet  Take 1 tablet by mouth once daily with breakfast   Misc Natural Products (HEALTHY LIVER PO) Take by mouth.   Multiple Vitamin (MULTIVITAMIN) tablet Take 1 tablet by mouth daily.   NON FORMULARY Adult stem cell activator   NON FORMULARY PC brain and liver   predniSONE (DELTASONE) 2.5 MG tablet    pyridostigmine (MESTINON) 60 MG tablet Take 60 mg by mouth 4 (four) times daily.   RESVERATROL PO Take 1 each by mouth daily.   simethicone (GAS-X) 80 MG chewable tablet Chew 1 tablet (80 mg total) by mouth every 6 (six) hours as needed for flatulence.   tirzepatide [facility]) 5 MG/0.5ML Pen Inject 5 mg into the skin once a week.   [DISCONTINUED] azelastine (ASTELIN) 0.1 % nasal spray Azelastine HCl   [DISCONTINUED] olmesartan-hydrochlorothiazide (BENICAR HCT) 40-25 MG tablet Take 1 tablet by mouth once daily   ALPHA LIPOIC ACID PO Take by mouth.   olmesartan-hydrochlorothiazide (BENICAR HCT) 40-25 MG tablet Take 1 tablet by mouth daily.   ranitidine (ZANTAC) 150 MG tablet Take 1 tablet (150 mg total) by mouth 2 (two) times daily. (Patient not taking: Reported on 10/13/2022)   UNABLE TO FIND Take by mouth.   [DISCONTINUED] doxycycline (VIBRA-TABS) 100 MG tablet Take 1 tablet (100 mg total) by mouth 2 (two) times daily.   No facility-administered encounter medications on file as  of 10/13/2022.    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 since 1990s   Myasthenia gravis (HCC)    Sinus drainage 02/03/2016   Wears partial dentures    upper    Past Surgical History:  Procedure Laterality Date   LASER  ABLATION OF CONDYLOMAS  11/04/2009   LUMBAR SPINE SURGERY  1986   MINOR FULGERATION OF ANAL CONDYLOMA N/A 02/10/2016   Procedure: EXCISION AND FULGURATION OF ANAL CONDYLOMA;  Surgeon: Avel Peace, MD;  Location: [facility];  Service: General;  Laterality: N/A;   Family History  Problem Relation Age of Onset   Aneurysm Brother 21       ruptured aneurysm   Diabetes Mother    Diabetes Sister    Cancer Sister        pancreatic   Hypertension Sister    Social History   Socioeconomic History   Marital status: Single    Spouse name: Not on file   Number of children: Not on file   Years of education: Not on file   Highest education level: Not on file  Occupational History   Not on file  Tobacco Use   Smoking status: Never   Smokeless tobacco: Never  Substance and Sexual Activity   Alcohol use: Yes    Comment: occasionally   Drug use: No   Sexual activity: Not on file  Other Topics Concern   Not on file  Social History Narrative   Not on file   Social Determinants of Health   Financial Resource Strain: Low Risk  (10/13/2022)   Overall Financial Resource Strain (CARDIA)    Difficulty of Paying Living Expenses: Not hard at all  Food Insecurity: No Food Insecurity (10/13/2022)   Hunger Vital Sign    Worried About Running Out of Food in the Last Year: Never true    Ran Out of Food in the Last Year: Never true  Transportation Needs: No Transportation Needs (10/13/2022)   PRAPARE - Administrator, Civil Service (Medical): No    Lack of Transportation (Non-Medical): No  Physical Activity: Inactive (10/13/2022)   Exercise Vital Sign    Days of Exercise per Week: 0 days    Minutes of Exercise per Session: 0 min  Stress: No Stress Concern Present (10/13/2022)   [employer] of Occupational Health - Occupational Stress Questionnaire    Feeling of Stress : Not at all  Social Connections: Moderately Isolated (10/13/2022)   Social Connection and  Isolation Panel [NHANES]    Frequency of Communication with Friends and Family: Once a week    Frequency of Social Gatherings with Friends and Family: More than three times a week    Attends Religious Services: More than 4 times per year    Active Member of [employer] or Organizations: No    Attends Engineer, structural: Never    Marital Status: Divorced    Tobacco Counseling Counseling given: Not Answered   Clinical Intake:     Pain : No/denies pain     Nutritional Status: BMI > 30  Obese Nutritional Risks: None Diabetes: No  How often do you need  to have someone help you when you read instructions, pamphlets, or other written materials from your doctor or pharmacy?: 1 - Never What is the last grade level you completed in school?: college  Interpreter Needed?: No      Activities of Daily Living    10/13/2022    2:42 PM  In your present state of health, do you have any difficulty performing the following activities:  Hearing? 0  Vision? 0  Difficulty concentrating or making decisions? 0  Walking or climbing stairs? 0  Dressing or bathing? 0  Doing errands, shopping? 0  Preparing Food and eating ? N  Using the Toilet? N  In the past six months, have you accidently leaked urine? N  Do you have problems with loss of bowel control? N  Managing your Medications? N  Managing your Finances? N  Housekeeping or managing your Housekeeping? N    Patient Care Team: Corwin Levins, MD as PCP - General (Internal Medicine)  Indicate any recent Medical Services you may have received from other than [facility] providers in the past year (date may be approximate).     Assessment:   This is a routine wellness examination for [place].  Hearing/Vision screen Vision Screening - Comments:: Annual eye exam Dr. Tawanna Sat, [facility]  Dietary issues and exercise activities discussed: Current Exercise Habits: The patient does not participate in regular exercise at present    Goals Addressed   None   Depression Screen    10/13/2022    2:43 PM 08/29/2022   10:44 AM 03/03/2022    9:42 AM 08/09/2021   10:34 AM 06/11/2020    2:25 PM 06/11/2020    2:04 PM 06/03/2019    2:21 PM  PHQ 2/9 Scores  PHQ - 2 Score 0 0 0 0 0 0 0  PHQ- 9 Score    0       Fall Risk    10/13/2022    2:42 PM 08/29/2022   10:44 AM 03/03/2022    9:42 AM 08/09/2021   10:34 AM 06/11/2020    2:25 PM  Fall Risk   Falls in the past year? 0 0 0 0 0  Number falls in past yr: 0 0 0 0   Injury with Fall? 0 0 0 0   Risk for fall due to : No Fall Risks No Fall Risks No Fall Risks    Follow up Falls evaluation completed Falls evaluation completed Falls evaluation completed      MEDICARE RISK AT HOME: Medicare Risk at Home Any stairs in or around the home?: Yes If so, are there any without handrails?: Yes Home free of loose throw rugs in walkways, pet beds, electrical cords, etc?: Yes Adequate lighting in your home to reduce risk of falls?: Yes Life alert?: No Use of a cane, walker or w/c?: No Grab bars in the bathroom?: No Shower chair or bench in shower?: No Elevated toilet seat or a handicapped toilet?: No  TIMED UP AND GO:  Was the test performed? Yes  Length of time to ambulate 10 feet: 6 sec Gait steady and fast without use of assistive device    Cognitive Function:        10/13/2022    2:43 PM  6CIT Screen  What Year? 0 points  What month? 0 points  What time? 0 points  Count back from 20 0 points  Months in reverse 0 points  Repeat phrase 0 points  Total Score 0 points  Immunizations Immunization History  Administered Date(s) Administered   PFIZER(Purple Top)SARS-COV-2 Vaccination 08/21/2019, 09/16/2019, 03/16/2020   PPD Test 08/12/2018   Pneumococcal Conjugate-13 08/12/2018   Tdap 10/30/2011    TDAP status: Due, Education has been provided regarding the importance of this vaccine. Advised may receive this vaccine at local pharmacy or Health Dept. Aware to provide  a copy of the vaccination record if obtained from local pharmacy or Health Dept. Verbalized acceptance and understanding.  Flu Vaccine status: Due, Education has been provided regarding the importance of this vaccine. Advised may receive this vaccine at local pharmacy or Health Dept. Aware to provide a copy of the vaccination record if obtained from local pharmacy or Health Dept. Verbalized acceptance and understanding.  Pneumococcal vaccine status: Due, Education has been provided regarding the importance of this vaccine. Advised may receive this vaccine at local pharmacy or Health Dept. Aware to provide a copy of the vaccination record if obtained from local pharmacy or Health Dept. Verbalized acceptance and understanding.  Covid-19 vaccine status: Completed vaccines  Qualifies for Shingles Vaccine? Yes   Zostavax completed No   Shingrix Completed?: No.    Education has been provided regarding the importance of this vaccine. Patient has been advised to call insurance company to determine out of pocket expense if they have not yet received this vaccine. Advised may also receive vaccine at local pharmacy or Health Dept. Verbalized acceptance and understanding.  Screening Tests Health Maintenance  Topic Date Due   OPHTHALMOLOGY EXAM  08/05/2019   COVID-19 Vaccine (4 - 2023-24 season) 10/14/2021   INFLUENZA VACCINE  05/14/2023 (Originally 09/14/2022)   Zoster Vaccines- Shingrix (1 of 2) 07/13/2023 (Originally 10/27/1979)   HEMOGLOBIN A1C  03/01/2023   FOOT EXAM  03/04/2023   Diabetic kidney evaluation - eGFR measurement  08/29/2023   Diabetic kidney evaluation - Urine ACR  08/29/2023   Medicare Annual Wellness (AWV)  10/13/2023   Colonoscopy  04/26/2025   Hepatitis C Screening  Completed   HIV Screening  Completed   HPV VACCINES  Aged Out   DTaP/Tdap/Td  Discontinued    Health Maintenance  Health Maintenance Due  Topic Date Due   OPHTHALMOLOGY EXAM  08/05/2019   COVID-19 Vaccine (4 -  2023-24 season) 10/14/2021    Colorectal cancer screening: Type of screening: Colonoscopy. Completed 04/26/2020. Repeat every 5 years  Lung Cancer Screening: (Low Dose CT Chest recommended if Age 30-80 years, 20 pack-year currently smoking OR have quit w/in 15years.) does not qualify.   Lung Cancer Screening Referral: n/a  Additional Screening:  Hepatitis C Screening: does qualify; Completed [id number]  Vision Screening: Recommended annual ophthalmology exams for early detection of glaucoma and other disorders of the eye. Is the patient up to date with their annual eye exam?  No  Who is the provider or what is the name of the office in which the patient attends annual eye exams? [facility] If pt is not established with a provider, would they like to be referred to a provider to establish care? No .   Dental Screening: Recommended annual dental exams for proper oral hygiene  Diabetic Foot Exam: Diabetic Foot Exam: Completed 03/03/2022  Community Resource Referral / Chronic Care Management: CRR required this visit?  No   CCM required this visit?  No    Plan:     I have personally reviewed and noted the following in the patient's chart:   Medical and social history Use of alcohol, tobacco or illicit drugs  Current  medications and supplements including opioid prescriptions. Patient is not currently taking opioid prescriptions. Functional ability and status Nutritional status Physical activity Advanced directives List of other physicians Hospitalizations, surgeries, and ER visits in previous 12 months Vitals Screenings to include cognitive, depression, and falls Referrals and appointments  In addition, I have reviewed and discussed with patient certain preventive protocols, quality metrics, and best practice recommendations. A written personalized care plan for preventive services as well as general preventive health recommendations were provided to patient.      Tamela Oddi, CMA   10/13/2022   After Visit Summary: AVS printed  Nurse Notes:  Mr. Schnack , Thank you for taking time to come for your Medicare Wellness Visit. I appreciate your ongoing commitment to your health goals. Please review the following plan we discussed and let me know if I can assist you in the future.   These are the goals we discussed:  Goals   None     This is a list of the screening recommended for you and due dates:  Health Maintenance  Topic Date Due   Eye exam for diabetics  08/05/2019   COVID-19 Vaccine (4 - 2023-24 season) 10/14/2021   Flu Shot  05/14/2023*   Zoster (Shingles) Vaccine (1 of 2) 07/13/2023*   Hemoglobin A1C  03/01/2023   Complete foot exam   03/04/2023   Yearly kidney function blood test for diabetes  08/29/2023   Yearly kidney health urinalysis for diabetes  08/29/2023   Medicare Annual Wellness Visit  10/13/2023   Colon Cancer Screening  04/26/2025   Hepatitis C Screening  Completed   HIV Screening  Completed   HPV Vaccine  Aged Out   DTaP/Tdap/Td vaccine  Discontinued  *Topic was postponed. The date shown is not the original due date.

## 2022-10-21 ENCOUNTER — Other Ambulatory Visit: Payer: Self-pay | Admitting: Internal Medicine

## 2022-10-23 ENCOUNTER — Other Ambulatory Visit: Payer: Self-pay

## 2022-10-31 ENCOUNTER — Telehealth: Payer: Self-pay | Admitting: Internal Medicine

## 2022-10-31 MED ORDER — CYCLOBENZAPRINE HCL 5 MG PO TABS: 5.0000 mg | ORAL_TABLET | Freq: Three times a day (TID) | ORAL | 2 refills | Status: DC | PRN

## 2022-10-31 NOTE — Addendum Note (Signed)
Addended by: Corwin Levins on: 10/31/2022 04:59 PM   Modules accepted: Orders

## 2022-10-31 NOTE — Telephone Encounter (Signed)
Prescription Request  10/31/2022  LOV: 08/29/2022  What is the name of the medication or equipment? cyclobenzaprine (FLEXERIL) 5 MG tablet   Have you contacted your pharmacy to request a refill? No   Which pharmacy would you like this sent to?  [facility] [address] [place], [place] - [address] [place] [address] Phone: [phone] Fax: [phone]    Patient notified that their request is being sent to the clinical staff for review and that they should receive a response within 2 business days.   Please advise at Mobile [phone] (mobile)

## 2022-10-31 NOTE — Telephone Encounter (Signed)
Done erx

## 2022-11-07 DIAGNOSIS — R053 Chronic cough: Secondary | ICD-10-CM | POA: Diagnosis not present

## 2022-11-21 DIAGNOSIS — M5441 Lumbago with sciatica, right side: Secondary | ICD-10-CM | POA: Diagnosis not present

## 2022-11-21 DIAGNOSIS — Z9889 Other specified postprocedural states: Secondary | ICD-10-CM | POA: Diagnosis not present

## 2022-11-21 DIAGNOSIS — E119 Type 2 diabetes mellitus without complications: Secondary | ICD-10-CM | POA: Diagnosis not present

## 2022-11-21 DIAGNOSIS — N181 Chronic kidney disease, stage 1: Secondary | ICD-10-CM | POA: Diagnosis not present

## 2022-11-21 DIAGNOSIS — M51361 Other intervertebral disc degeneration, lumbar region with lower extremity pain only: Secondary | ICD-10-CM | POA: Diagnosis not present

## 2022-11-21 DIAGNOSIS — G8929 Other chronic pain: Secondary | ICD-10-CM | POA: Diagnosis not present

## 2022-11-21 DIAGNOSIS — Z794 Long term (current) use of insulin: Secondary | ICD-10-CM | POA: Diagnosis not present

## 2022-11-21 DIAGNOSIS — Z6835 Body mass index (BMI) 35.0-35.9, adult: Secondary | ICD-10-CM | POA: Diagnosis not present

## 2022-11-21 DIAGNOSIS — G7000 Myasthenia gravis without (acute) exacerbation: Secondary | ICD-10-CM | POA: Diagnosis not present

## 2022-11-21 DIAGNOSIS — E0822 Diabetes mellitus due to underlying condition with diabetic chronic kidney disease: Secondary | ICD-10-CM | POA: Diagnosis not present

## 2022-11-21 DIAGNOSIS — G5603 Carpal tunnel syndrome, bilateral upper limbs: Secondary | ICD-10-CM | POA: Diagnosis not present

## 2022-11-21 DIAGNOSIS — R208 Other disturbances of skin sensation: Secondary | ICD-10-CM | POA: Diagnosis not present

## 2022-11-24 ENCOUNTER — Other Ambulatory Visit: Payer: Self-pay

## 2022-11-24 ENCOUNTER — Other Ambulatory Visit: Payer: Self-pay | Admitting: Internal Medicine

## 2023-01-04 ENCOUNTER — Telehealth: Payer: Self-pay

## 2023-01-04 NOTE — Patient Outreach (Signed)
Attempted to contact patient regarding care gaps. Left voicemail for patient to return my call at [phone].  Nicholes Rough, CMA Care Guide [employer]

## 2023-03-01 ENCOUNTER — Ambulatory Visit: Payer: Medicare HMO | Admitting: Internal Medicine

## 2023-03-27 DIAGNOSIS — M5441 Lumbago with sciatica, right side: Secondary | ICD-10-CM | POA: Diagnosis not present

## 2023-03-27 DIAGNOSIS — M51361 Other intervertebral disc degeneration, lumbar region with lower extremity pain only: Secondary | ICD-10-CM | POA: Diagnosis not present

## 2023-03-27 DIAGNOSIS — Z9889 Other specified postprocedural states: Secondary | ICD-10-CM | POA: Diagnosis not present

## 2023-03-27 DIAGNOSIS — G8929 Other chronic pain: Secondary | ICD-10-CM | POA: Diagnosis not present

## 2023-03-27 DIAGNOSIS — Z6832 Body mass index (BMI) 32.0-32.9, adult: Secondary | ICD-10-CM | POA: Diagnosis not present

## 2023-03-27 DIAGNOSIS — G7000 Myasthenia gravis without (acute) exacerbation: Secondary | ICD-10-CM | POA: Diagnosis not present

## 2023-03-27 DIAGNOSIS — E876 Hypokalemia: Secondary | ICD-10-CM | POA: Diagnosis not present

## 2023-04-09 ENCOUNTER — Ambulatory Visit: Payer: Medicare HMO | Admitting: Internal Medicine

## 2023-04-10 ENCOUNTER — Ambulatory Visit: Payer: Medicare HMO | Admitting: Internal Medicine

## 2023-04-19 ENCOUNTER — Encounter: Payer: Self-pay | Admitting: Internal Medicine

## 2023-04-19 ENCOUNTER — Ambulatory Visit: Payer: Medicare (Managed Care) | Admitting: Internal Medicine

## 2023-04-19 VITALS — BP 122/80 | HR 84 | Temp 98.3°F | Ht 72.0 in | Wt 238.0 lb

## 2023-04-19 DIAGNOSIS — E7800 Pure hypercholesterolemia, unspecified: Secondary | ICD-10-CM

## 2023-04-19 DIAGNOSIS — Z125 Encounter for screening for malignant neoplasm of prostate: Secondary | ICD-10-CM

## 2023-04-19 DIAGNOSIS — I10 Essential (primary) hypertension: Secondary | ICD-10-CM | POA: Diagnosis not present

## 2023-04-19 DIAGNOSIS — Z0000 Encounter for general adult medical examination without abnormal findings: Secondary | ICD-10-CM

## 2023-04-19 DIAGNOSIS — R052 Subacute cough: Secondary | ICD-10-CM

## 2023-04-19 DIAGNOSIS — Z0001 Encounter for general adult medical examination with abnormal findings: Secondary | ICD-10-CM

## 2023-04-19 DIAGNOSIS — E538 Deficiency of other specified B group vitamins: Secondary | ICD-10-CM

## 2023-04-19 DIAGNOSIS — E119 Type 2 diabetes mellitus without complications: Secondary | ICD-10-CM | POA: Diagnosis not present

## 2023-04-19 DIAGNOSIS — Z794 Long term (current) use of insulin: Secondary | ICD-10-CM

## 2023-04-19 DIAGNOSIS — E0822 Diabetes mellitus due to underlying condition with diabetic chronic kidney disease: Secondary | ICD-10-CM

## 2023-04-19 DIAGNOSIS — E559 Vitamin D deficiency, unspecified: Secondary | ICD-10-CM

## 2023-04-19 LAB — LIPID PANEL
Cholesterol: 191 mg/dL (ref 0–200)
HDL: 55.9 mg/dL (ref >39.00)
LDL Cholesterol: 114 mg/dL — ABNORMAL HIGH (ref 0–99)
NonHDL: 135.19
Total CHOL/HDL Ratio: 3
Triglycerides: 108 mg/dL (ref 0.0–149.0)
VLDL: 21.6 mg/dL (ref 0.0–40.0)

## 2023-04-19 LAB — BASIC METABOLIC PANEL
BUN: 18 mg/dL (ref 6–23)
CO2: 32 meq/L (ref 19–32)
Calcium: 10 mg/dL (ref 8.4–10.5)
Chloride: 96 meq/L (ref 96–112)
Creatinine, Ser: 1.36 mg/dL (ref 0.40–1.50)
GFR: 55.01 mL/min — ABNORMAL LOW (ref >60.00)
Glucose, Bld: 91 mg/dL (ref 70–99)
Potassium: 3.5 meq/L (ref 3.5–5.1)
Sodium: 139 meq/L (ref 135–145)

## 2023-04-19 LAB — CBC WITH DIFFERENTIAL/PLATELET
Basophils Absolute: 0 10*3/uL (ref 0.0–0.1)
Basophils Relative: 0.8 % (ref 0.0–3.0)
Eosinophils Absolute: 0.1 10*3/uL (ref 0.0–0.7)
Eosinophils Relative: 1.1 % (ref 0.0–5.0)
HCT: 46.4 % (ref 39.0–52.0)
Hemoglobin: 15.5 g/dL (ref 13.0–17.0)
Lymphocytes Relative: 44.7 % (ref 12.0–46.0)
Lymphs Abs: 2.1 10*3/uL (ref 0.7–4.0)
MCHC: 33.3 g/dL (ref 30.0–36.0)
MCV: 96.5 fl (ref 78.0–100.0)
Monocytes Absolute: 0.5 10*3/uL (ref 0.1–1.0)
Monocytes Relative: 10.9 % (ref 3.0–12.0)
Neutro Abs: 2 10*3/uL (ref 1.4–7.7)
Neutrophils Relative %: 42.5 % — ABNORMAL LOW (ref 43.0–77.0)
Platelets: 268 10*3/uL (ref 150.0–400.0)
RBC: 4.81 Mil/uL (ref 4.22–5.81)
RDW: 13.3 % (ref 11.5–15.5)
WBC: 4.8 10*3/uL (ref 4.0–10.5)

## 2023-04-19 LAB — HEMOGLOBIN A1C: Hgb A1c MFr Bld: 10.4 % — ABNORMAL HIGH (ref 4.6–6.5)

## 2023-04-19 LAB — HEPATIC FUNCTION PANEL
ALT: 27 U/L (ref 0–53)
AST: 30 U/L (ref 0–37)
Albumin: 4.5 g/dL (ref 3.5–5.2)
Alkaline Phosphatase: 48 U/L (ref 39–117)
Bilirubin, Direct: 0.1 mg/dL (ref 0.0–0.3)
Total Bilirubin: 0.7 mg/dL (ref 0.2–1.2)
Total Protein: 7 g/dL (ref 6.0–8.3)

## 2023-04-19 LAB — TSH: TSH: 0.74 u[IU]/mL (ref 0.35–5.50)

## 2023-04-19 LAB — VITAMIN B12: Vitamin B-12: 327 pg/mL (ref 211–911)

## 2023-04-19 LAB — VITAMIN D 25 HYDROXY (VIT D DEFICIENCY, FRACTURES): VITD: 41.09 ng/mL (ref 30.00–100.00)

## 2023-04-19 LAB — MICROALBUMIN / CREATININE URINE RATIO
Creatinine,U: 182.7 mg/dL
Microalb Creat Ratio: 6.4 mg/g (ref 0.0–30.0)
Microalb, Ur: 1.2 mg/dL (ref 0.0–1.9)

## 2023-04-19 LAB — PSA: PSA: 1.63 ng/mL (ref 0.10–4.00)

## 2023-04-19 NOTE — Patient Instructions (Signed)

## 2023-04-19 NOTE — Progress Notes (Signed)
 Patient ID: Miguel Powell, male   DOB: 08/12/1960, 63 y.o.   MRN: [id number]         Chief Complaint:: wellness exam and cough, dm, htn, hld, low vit d       HPI:  Miguel Powell is a 63 y.o. male here for wellness exam; decliens flu shot, shingrix and pneumovax, o/w up to date                        Also saw Dr [facility] today now on omprazole 40 bid, hoping to help his chronic cough.  Also had recent URI now improved but still cough . Pt denies chest pain, increased sob or doe, wheezing, orthopnea, PND, increased LE swelling, palpitations, dizziness or syncope.   Pt denies polydipsia, polyuria, or new focal neuro s/s.    Pt denies fever, wt loss, night sweats, loss of appetite, or other constitutional symptoms  Pt reports was forced due to cost tyo stop the mounjaro 5 mg last October, but should be able to restart now.   Wt Readings from Last 3 Encounters:  04/19/23 238 lb (108 kg)  10/13/22 232 lb 4 oz (105.3 kg)  08/29/22 236 lb (107 kg)   BP Readings from Last 3 Encounters:  04/19/23 122/80  10/13/22 116/74  08/29/22 130/82   Immunization History  Administered Date(s) Administered   PFIZER(Purple Top)SARS-COV-2 Vaccination 08/21/2019, 09/16/2019, 03/16/2020   PPD Test 08/12/2018   Pneumococcal Conjugate-13 08/12/2018   Tdap 10/30/2011   There are no preventive care reminders to display for this patient.     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 since 1990s   Myasthenia gravis (HCC)    Sinus drainage 02/03/2016   Wears partial dentures    upper    Past Surgical History:  Procedure Laterality Date   LASER ABLATION OF CONDYLOMAS  11/04/2009   LUMBAR SPINE SURGERY  1986   MINOR FULGERATION OF ANAL CONDYLOMA N/A 02/10/2016   Procedure: EXCISION AND FULGURATION OF ANAL CONDYLOMA;  Surgeon: Avel Peace, MD;  Location:  [facility];  Service: General;  Laterality: N/A;    reports that he has never smoked. He has never used smokeless tobacco. He reports current alcohol use. He reports that he does not use drugs. family history includes Aneurysm (age of onset: 40) in his brother; Cancer in his sister; Diabetes in his mother and sister; Hypertension in his sister. Allergies  Allergen Reactions   Azithromycin Other (See Comments)    DUE TO MYASTHENIA GRAVIS   Botox [Botulinum Toxin Type A] Other (See Comments)    DUE TO MYASTHENIA GRAVIS   Gentamycin [Gentamicin] Other (See Comments)    DUE TO MYASTHENIA GRAVIS   Ketek [Telithromycin] Other (See Comments)    DUE TO MYASTHENIA GRAVIS   Levaquin [Levofloxacin In D5w] Other (See Comments)    DUE TO MYASTHENIA GRAVIS   Magnesium-Containing Compounds Other (See Comments)    DUE TO MYASTHENIA GRAVIS   Neomycin Other (See Comments)    DUE TO MYASTHENIA GRAVIS   Penicillins Other (See Comments)    DUE TO MYASTHENIA GRAVIS    Procainamide Other (See Comments)    DUE TO MYASTHENIA GRAVIS   Quinine Derivatives Other (See Comments)    DUE TO MYASTHENIA GRAVIS   Current  Outpatient Medications on File Prior to Visit  Medication Sig Dispense Refill   ALPHA LIPOIC ACID PO Take by mouth.     amLODipine (NORVASC) 5 MG tablet Take 1 tablet by mouth once daily 90 tablet 3   azaTHIOprine (IMURAN) 50 MG tablet Take 200 mg by mouth daily.     azelastine (ASTELIN) 0.1 % nasal spray SPRAY TWO SPRAYS IN EACH NOSTRIL TWICE DAILY AS DIRECTED 30 mL 5   calcium-vitamin D (OSCAL-500) 500-400 MG-UNIT tablet Take 1 tablet by mouth daily. 500-600     CHROMIUM PO Take by mouth.     Cranberry-Milk Thistle (LIVER & KIDNEY CLEANSER PO) Take by mouth.     cyclobenzaprine (FLEXERIL) 5 MG tablet Take 1 tablet (5 mg total) by mouth 3 (three) times daily as needed for muscle spasms. 60 tablet 2   diphenhydrAMINE (BENADRYL) 25 MG tablet Take 25 mg by mouth every 6 (six) hours  as needed for sleep.      hydrochlorothiazide (MICROZIDE) 12.5 MG capsule Take 1 capsule by mouth daily.     metFORMIN (GLUCOPHAGE-XR) 500 MG 24 hr tablet Take 1 tablet by mouth once daily with breakfast 90 tablet 3   Misc Natural Products (HEALTHY LIVER PO) Take by mouth.     Multiple Vitamin (MULTIVITAMIN) tablet Take 1 tablet by mouth daily.     NON FORMULARY Adult stem cell activator     NON FORMULARY PC brain and liver     olmesartan-hydrochlorothiazide (BENICAR HCT) 40-25 MG tablet Take 1 tablet by mouth daily. 90 tablet 3   predniSONE (DELTASONE) 2.5 MG tablet      pyridostigmine (MESTINON) 60 MG tablet Take 60 mg by mouth 4 (four) times daily.     ranitidine (ZANTAC) 150 MG tablet Take 1 tablet (150 mg total) by mouth 2 (two) times daily. 180 tablet 3   RESVERATROL PO Take 1 each by mouth daily.     simethicone (GAS-X) 80 MG chewable tablet Chew 1 tablet (80 mg total) by mouth every 6 (six) hours as needed for flatulence. 30 tablet 0   tirzepatide (MOUNJARO) 5 MG/0.5ML Pen Inject 5 mg into the skin once a week. 6 mL 3   UNABLE TO FIND Take by mouth.     No current facility-administered medications on file prior to visit.        ROS:  All others reviewed and negative.  Objective        PE:  BP 122/80 (BP Location: Right Arm, Patient Position: Sitting, Cuff Size: Normal)   Pulse 84   Temp 98.3 F (36.8 C) (Oral)   Ht 6' (1.829 m)   Wt 238 lb (108 kg)   SpO2 99%   BMI 32.28 kg/m                 Constitutional: Pt appears in NAD               HENT: Head: NCAT.                Right Ear: External ear normal.                 Left Ear: External ear normal.                Eyes: . Pupils are equal, round, and reactive to light. Conjunctivae and EOM are normal               Nose: without d/c or deformity  Neck: Neck supple. Gross normal ROM               Cardiovascular: Normal rate and regular rhythm.                 Pulmonary/Chest: Effort normal and breath sounds  without rales or wheezing.                Abd:  Soft, NT, ND, + BS, no organomegaly               Neurological: Pt is alert. At baseline orientation, motor grossly intact               Skin: Skin is warm. No rashes, no other new lesions, LE edema - none               Psychiatric: Pt behavior is normal without agitation   Micro: none  Cardiac tracings I have personally interpreted today:  none  Pertinent Radiological findings (summarize): none   Lab Results  Component Value Date   WBC 4.8 04/19/2023   HGB 15.5 04/19/2023   HCT 46.4 04/19/2023   PLT 268.0 04/19/2023   GLUCOSE 91 04/19/2023   CHOL 191 04/19/2023   TRIG 108.0 04/19/2023   HDL 55.90 04/19/2023   LDLCALC 114 (H) 04/19/2023   ALT 27 04/19/2023   AST 30 04/19/2023   NA 139 04/19/2023   K 3.5 04/19/2023   CL 96 04/19/2023   CREATININE 1.36 04/19/2023   BUN 18 04/19/2023   CO2 32 04/19/2023   TSH 0.74 04/19/2023   PSA 1.63 04/19/2023   HGBA1C 10.4 (H) 04/19/2023   MICROALBUR 1.2 04/19/2023   Assessment/Plan:  Iyad Deroo is a 63 y.o. Black or African American [2] male with  has a past medical history of Anal condyloma (01/2016), Arthritis, HLD (hyperlipidemia) (05/03/2018), Hypertension, Myasthenia gravis (HCC), Sinus drainage (02/03/2016), and Wears partial dentures.  Encounter for well adult exam with abnormal findings Age and sex appropriate education and counseling updated with regular exercise and diet Referrals for preventative services - none needed Immunizations addressed - decliens all today Smoking counseling  - none needed Evidence for depression or other mood disorder - none significant Most recent labs reviewed. 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, with long-term current use of insulin (HCC) Lab Results  Component Value Date   HGBA1C 10.4 (H) 04/19/2023   Severe uncontrolled,  pt to restart mounjaro 5 mg weekly, cont metfomrin ER 500 mg - 1 qd   Essential hypertension BP Readings from Last 3 Encounters:  04/19/23 122/80  10/13/22 116/74  08/29/22 130/82   Stable, pt to continue medical treatment norvasc 5 every day,  benicar hct 40 - 25  qd   HLD (hyperlipidemia) Lab Results  Component Value Date   LDLCALC 114 (H) 04/19/2023   Uncontrolled, declines statin or zetia or repatha for now   Vitamin D deficiency Last vitamin D Lab Results  Component Value Date   VD25OH 41.09 04/19/2023   Stable, cont oral replacement  Followup: Return in about 6 months (around 10/20/2023).  Oliver Barre, MD 04/22/2023 1:50 PM [facility]  Primary Care - [facility]

## 2023-04-20 LAB — URINALYSIS, ROUTINE W REFLEX MICROSCOPIC
Bilirubin Urine: NEGATIVE
Hgb urine dipstick: NEGATIVE
Leukocytes,Ua: NEGATIVE
Nitrite: NEGATIVE
Specific Gravity, Urine: 1.015 (ref 1.000–1.030)
Total Protein, Urine: NEGATIVE
Urine Glucose: NEGATIVE
Urobilinogen, UA: 0.2 (ref 0.0–1.0)
pH: 6 (ref 5.0–8.0)

## 2023-04-22 ENCOUNTER — Encounter: Payer: Self-pay | Admitting: Internal Medicine

## 2023-04-22 NOTE — Assessment & Plan Note (Signed)
 Lab Results  Component Value Date   HGBA1C 10.4 (H) 04/19/2023   Severe uncontrolled,  pt to restart mounjaro 5 mg weekly, cont metfomrin ER 500 mg - 1 qd

## 2023-04-22 NOTE — Assessment & Plan Note (Signed)
 BP Readings from Last 3 Encounters:  04/19/23 122/80  10/13/22 116/74  08/29/22 130/82   Stable, pt to continue medical treatment norvasc 5 every day,  benicar hct 40 - 25  qd

## 2023-04-22 NOTE — Assessment & Plan Note (Signed)
 Lab Results  Component Value Date   LDLCALC 114 (H) 04/19/2023   Uncontrolled, declines statin or zetia or repatha for now

## 2023-04-22 NOTE — Assessment & Plan Note (Signed)
 Age and sex appropriate education and counseling updated with regular exercise and diet Referrals for preventative services - none needed Immunizations addressed - decliens all today Smoking counseling  - none needed Evidence for depression or other mood disorder - none significant Most recent labs reviewed. I have personally reviewed and have noted: 1) the patient's medical and social history 2) The patient's current medications and supplements 3) The patient's height, weight, and BMI have been recorded in the chart

## 2023-04-22 NOTE — Assessment & Plan Note (Signed)
 Last vitamin D Lab Results  Component Value Date   VD25OH 41.09 04/19/2023   Stable, cont oral replacement

## 2023-04-22 NOTE — Assessment & Plan Note (Signed)
 Subacute on chronic, has seen GI now on new PPI today, also ok for delsym ot prn recent post uri cough

## 2023-05-21 ENCOUNTER — Telehealth: Payer: Self-pay

## 2023-05-21 DIAGNOSIS — E669 Obesity, unspecified: Secondary | ICD-10-CM | POA: Diagnosis not present

## 2023-05-21 DIAGNOSIS — G7000 Myasthenia gravis without (acute) exacerbation: Secondary | ICD-10-CM | POA: Diagnosis not present

## 2023-05-21 DIAGNOSIS — H532 Diplopia: Secondary | ICD-10-CM | POA: Diagnosis not present

## 2023-05-21 NOTE — Telephone Encounter (Signed)
 Patient was identified as falling into the [place] Measure - Diabetes.   Patient was: Appointment already scheduled for:  10/22/2023.

## 2023-06-18 ENCOUNTER — Telehealth: Payer: Self-pay

## 2023-06-18 NOTE — Telephone Encounter (Signed)
 Patient was identified as falling into the [place] Measure - Diabetes.   Patient was: Appointment already scheduled for:  09/08.

## 2023-07-11 ENCOUNTER — Ambulatory Visit (INDEPENDENT_AMBULATORY_CARE_PROVIDER_SITE_OTHER): Payer: Medicare (Managed Care) | Admitting: Internal Medicine

## 2023-07-11 ENCOUNTER — Encounter: Payer: Self-pay | Admitting: Internal Medicine

## 2023-07-11 VITALS — BP 138/80 | HR 88 | Temp 99.0°F | Ht 72.0 in | Wt 224.0 lb

## 2023-07-11 DIAGNOSIS — I10 Essential (primary) hypertension: Secondary | ICD-10-CM

## 2023-07-11 DIAGNOSIS — R051 Acute cough: Secondary | ICD-10-CM | POA: Diagnosis not present

## 2023-07-11 DIAGNOSIS — E559 Vitamin D deficiency, unspecified: Secondary | ICD-10-CM | POA: Diagnosis not present

## 2023-07-11 DIAGNOSIS — L0291 Cutaneous abscess, unspecified: Secondary | ICD-10-CM | POA: Insufficient documentation

## 2023-07-11 DIAGNOSIS — R062 Wheezing: Secondary | ICD-10-CM

## 2023-07-11 DIAGNOSIS — L723 Sebaceous cyst: Secondary | ICD-10-CM | POA: Insufficient documentation

## 2023-07-11 MED ORDER — ALBUTEROL SULFATE HFA 108 (90 BASE) MCG/ACT IN AERS: 2.0000 | INHALATION_SPRAY | Freq: Four times a day (QID) | RESPIRATORY_TRACT | 1 refills | Status: AC | PRN

## 2023-07-11 MED ORDER — HYDROCODONE BIT-HOMATROP MBR 5-1.5 MG/5ML PO SOLN: 5.0000 mL | Freq: Four times a day (QID) | ORAL | 0 refills | Status: AC | PRN

## 2023-07-11 MED ORDER — DOXYCYCLINE HYCLATE 100 MG PO TABS: 100.0000 mg | ORAL_TABLET | Freq: Two times a day (BID) | ORAL | 0 refills | Status: DC

## 2023-07-11 NOTE — Assessment & Plan Note (Signed)
 Mild to mod, for albuterol  hfa prn, continue current prednisone ,  to f/u any worsening symptoms or concerns

## 2023-07-11 NOTE — Progress Notes (Signed)
 Patient ID: Miguel Powell, male   DOB: Sep 19, 1960, 63 y.o.   MRN: [id number]        Chief Complaint: follow up acute cough, wheezing, htn       HPI:  Miguel Powell is a 63 y.o. male Here with acute onset mild to mod 4 days ST, HA, general weakness and malaise, with prod cough greenish sputum, but Pt denies chest pain, orthopnea, PND, increased LE swelling, palpitations, dizziness or syncope except for onset mild wheezing sob in last 2 days.   Pt denies polydipsia, polyuria, or new focal neuro s/s.    Pt denies fever, wt loss, night sweats, loss of appetite, or other constitutional symptoms Taking 7 mg daily prednisone .         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 since 1990s   Myasthenia gravis (HCC)    Sinus drainage 02/03/2016   Wears partial dentures    upper    Past Surgical History:  Procedure Laterality Date   LASER ABLATION OF CONDYLOMAS  11/04/2009   LUMBAR SPINE SURGERY  1986   MINOR FULGERATION OF ANAL CONDYLOMA N/A 02/10/2016   Procedure: EXCISION AND FULGURATION OF ANAL CONDYLOMA;  Surgeon: Adalberto Hollow, MD;  [facility];  Service: General;  Laterality: N/A;    reports that he has never smoked. He has never used smokeless tobacco. He reports current alcohol use. He reports that he does not use drugs. family history includes Aneurysm (age of onset: 86) in his brother; Cancer in his sister; Diabetes in his mother and sister; Hypertension in his sister. Allergies  Allergen Reactions   Azithromycin  Other (See Comments)    DUE TO MYASTHENIA GRAVIS   Botox [Botulinum Toxin  Type A] Other (See Comments)    DUE TO MYASTHENIA GRAVIS   Gentamycin [Gentamicin] Other (See Comments)    DUE TO MYASTHENIA GRAVIS   Ketek [Telithromycin] Other (See Comments)    DUE TO MYASTHENIA GRAVIS   Levaquin [Levofloxacin In D5w] Other (See Comments)    DUE TO MYASTHENIA GRAVIS   Magnesium-Containing Compounds Other (See Comments)    DUE TO MYASTHENIA GRAVIS   Neomycin Other (See Comments)    DUE TO MYASTHENIA GRAVIS   Penicillins Other (See Comments)    DUE TO MYASTHENIA GRAVIS    Procainamide Other (See Comments)    DUE TO MYASTHENIA GRAVIS   Quinine Derivatives Other (See Comments)    DUE TO MYASTHENIA GRAVIS   Current Outpatient Medications on File Prior to Visit  Medication Sig Dispense Refill   ALPHA LIPOIC ACID PO Take by mouth.     amLODipine  (NORVASC ) 5 MG tablet Take 1 tablet by mouth once daily 90 tablet 3   azaTHIOprine (IMURAN) 50 MG tablet Take 200 mg by mouth daily.     azelastine  (ASTELIN ) 0.1 % nasal spray SPRAY TWO SPRAYS IN EACH NOSTRIL TWICE DAILY AS DIRECTED 30 mL 5   calcium-vitamin D  (OSCAL-500) 500-400 MG-UNIT tablet Take 1 tablet by mouth daily. 500-600  CHROMIUM PO Take by mouth.     Cranberry-Milk Thistle (LIVER & KIDNEY CLEANSER PO) Take by mouth.     cyclobenzaprine  (FLEXERIL ) 5 MG tablet Take 1 tablet (5 mg total) by mouth 3 (three) times daily as needed for muscle spasms. 60 tablet 2   diphenhydrAMINE (BENADRYL) 25 MG tablet Take 25 mg by mouth every 6 (six) hours as needed for sleep.      gabapentin (NEURONTIN) 300 MG capsule Take 1 capsule 3 times a day by oral route.     hydrochlorothiazide  (MICROZIDE ) 12.5 MG capsule Take 1 capsule by mouth daily.     metFORMIN  (GLUCOPHAGE -XR) 500 MG 24 hr tablet Take 1 tablet by mouth once daily with breakfast 90 tablet 3   Misc Natural Products (HEALTHY LIVER PO) Take by mouth.     Multiple Vitamin (MULTIVITAMIN) tablet Take 1 tablet by mouth daily.     NON FORMULARY Adult stem cell activator      NON FORMULARY PC brain and liver     olmesartan -hydrochlorothiazide  (BENICAR  HCT) 40-25 MG tablet Take 1 tablet by mouth daily. 90 tablet 3   predniSONE  (DELTASONE ) 2.5 MG tablet      pyridostigmine (MESTINON) 60 MG tablet Take 60 mg by mouth 4 (four) times daily.     ranitidine  (ZANTAC ) 150 MG tablet Take 1 tablet (150 mg total) by mouth 2 (two) times daily. 180 tablet 3   RESVERATROL PO Take 1 each by mouth daily.     simethicone  (GAS-X) 80 MG chewable tablet Chew 1 tablet (80 mg total) by mouth every 6 (six) hours as needed for flatulence. 30 tablet 0   tirzepatide  (MOUNJARO ) 5 MG/0.5ML Pen Inject 5 mg into the skin once a week. 6 mL 3   UNABLE TO FIND Take by mouth.     No current facility-administered medications on file prior to visit.        ROS:  All others reviewed and negative.  Objective        PE:  BP 138/80 (BP Location: Right Arm, Patient Position: Sitting, Cuff Size: Normal)   Pulse 88   Temp 99 F (37.2 C) (Oral)   Ht 6' (1.829 m)   Wt 224 lb (101.6 kg)   SpO2 97%   BMI 30.38 kg/m                 Constitutional: Pt appears mild ill               HENT: Head: NCAT.                Right Ear: External ear normal.                 Left Ear: External ear normal.                Eyes: . Pupils are equal, round, and reactive to light. Conjunctivae and EOM are normal               Nose: without d/c or deformity               Neck: Neck supple. Gross normal ROM               Cardiovascular: Normal rate and regular rhythm.                 Pulmonary/Chest: Effort normal and breath sounds without rales but with few mild bilateral wheezing.  Neurological: Pt is alert. At baseline orientation, motor grossly intact               Skin: Skin is warm. No rashes, no other new lesions, LE edema - trace bialteral               Psychiatric: Pt behavior is normal without agitation   Micro: none  Cardiac tracings I have personally interpreted today:   none  Pertinent Radiological findings (summarize): none   Lab Results  Component Value Date   WBC 4.8 04/19/2023   HGB 15.5 04/19/2023   HCT 46.4 04/19/2023   PLT 268.0 04/19/2023   GLUCOSE 91 04/19/2023   CHOL 191 04/19/2023   TRIG 108.0 04/19/2023   HDL 55.90 04/19/2023   LDLCALC 114 (H) 04/19/2023   ALT 27 04/19/2023   AST 30 04/19/2023   NA 139 04/19/2023   K 3.5 04/19/2023   CL 96 04/19/2023   CREATININE 1.36 04/19/2023   BUN 18 04/19/2023   CO2 32 04/19/2023   TSH 0.74 04/19/2023   PSA 1.63 04/19/2023   HGBA1C 10.4 (H) 04/19/2023   MICROALBUR 1.2 04/19/2023   Assessment/Plan:  Miguel Powell is a 63 y.o. Black or African American [2] male with  has a past medical history of Anal condyloma (01/2016), Arthritis, HLD (hyperlipidemia) (05/03/2018), Hypertension, Myasthenia gravis (HCC), Sinus drainage (02/03/2016), and Wears partial dentures.  Essential hypertension BP Readings from Last 3 Encounters:  07/11/23 138/80  04/19/23 122/80  10/13/22 116/74   Stable, pt to continue medical treatment norvasc  5 every day, benicar  hct 40 25 qd   Cough Mild to mod, for antibx course doxycycline  100 bid, cough med prn, declines cxxr,,  to f/u any worsening symptoms or concerns  Vitamin D  deficiency Last vitamin D  Lab Results  Component Value Date   VD25OH 41.09 04/19/2023   Stable, cont oral replacement   Wheezing Mild to mod, for albuterol  hfa prn, continue current prednisone ,  to f/u any worsening symptoms or concerns  Followup: Return if symptoms worsen or fail to improve.  Rosalia Colonel, MD 07/11/2023 7:48 PM [facility] [place] [facility] - [facility]

## 2023-07-11 NOTE — Assessment & Plan Note (Signed)
 Last vitamin D Lab Results  Component Value Date   VD25OH 41.09 04/19/2023   Stable, cont oral replacement

## 2023-07-11 NOTE — Assessment & Plan Note (Signed)
 BP Readings from Last 3 Encounters:  07/11/23 138/80  04/19/23 122/80  10/13/22 116/74   Stable, pt to continue medical treatment norvasc  5 every day, benicar  hct 40 25 qd

## 2023-07-11 NOTE — Assessment & Plan Note (Signed)
 Mild to mod, for antibx course doxycycline  100 bid, cough med prn, declines cxxr,,  to f/u any worsening symptoms or concerns

## 2023-07-11 NOTE — Patient Instructions (Signed)
 Please take all new medication as prescribed  - the antibiotic, cough medicine, and inhaler as needed  Please continue all other medications as before, and refills have been done if requested.  Please have the pharmacy call with any other refills you may need.  Please keep your appointments with your specialists as you may have planned

## 2023-09-01 ENCOUNTER — Other Ambulatory Visit: Payer: Self-pay | Admitting: Internal Medicine

## 2023-09-10 ENCOUNTER — Telehealth: Payer: Self-pay | Admitting: Pharmacist

## 2023-09-10 NOTE — Progress Notes (Signed)
 Pharmacy Quality Measure Review  This patient is appearing on a report for being at risk of failing the adherence measure for hypertension (ACEi/ARB) medications this calendar year.   Medication: Olmesartan /HCTZ Last fill date: 07/25/23 for 90 day supply  Insurance report was not up to date. No action needed at this time.   Darrelyn Drum, PharmD, BCPS, CPP Clinical Pharmacist Practitioner [facility] [phone]

## 2023-09-28 ENCOUNTER — Other Ambulatory Visit: Payer: Self-pay | Admitting: Internal Medicine

## 2023-10-09 ENCOUNTER — Other Ambulatory Visit: Payer: Self-pay | Admitting: Internal Medicine

## 2023-10-09 NOTE — Telephone Encounter (Signed)
 Pt is calling to advise that he is completely out of the medication. Advised him of the 3 day refill medication policy as well.

## 2023-10-12 NOTE — Telephone Encounter (Unsigned)
 Copied from CRM [id number]. Topic: Clinical - Medication Question >> Oct 12, 2023  1:50 PM Miguel Powell wrote: Reason for CRM: Patient called in to follow up on prescription refill amLODipine  (NORVASC ) 5 MG tablet stated he has not taken any in 4 days due to being out. Would like for it to be filled today

## 2023-10-16 ENCOUNTER — Ambulatory Visit: Payer: Medicare (Managed Care)

## 2023-10-17 ENCOUNTER — Ambulatory Visit: Payer: Medicare (Managed Care)

## 2023-10-17 VITALS — Ht 72.0 in | Wt 224.0 lb

## 2023-10-17 DIAGNOSIS — Z0000 Encounter for general adult medical examination without abnormal findings: Secondary | ICD-10-CM

## 2023-10-17 NOTE — Progress Notes (Signed)
 Subjective:   Miguel Powell is a 63 y.o. who presents for a Medicare Wellness preventive visit.  As a reminder, Annual Wellness Visits don't include a physical exam, and some assessments may be limited, especially if this visit is performed virtually. We may recommend an in-person follow-up visit with your provider if needed.  Visit Complete: Virtual I connected with  Miguel Powell on 10/17/23 by a audio enabled telemedicine application and verified that I am speaking with the correct person using two identifiers.  Patient Location: Home  Provider Location: Office/Clinic  I discussed the limitations of evaluation and management by telemedicine. The patient expressed understanding and agreed to proceed.  Vital Signs: Because this visit was a virtual/telehealth visit, some criteria may be missing or patient reported. Any vitals not documented were not able to be obtained and vitals that have been documented are patient reported.  VideoDeclined- This patient declined Librarian, academic. Therefore the visit was completed with audio only.  Persons Participating in Visit: Patient.  AWV Questionnaire: No: Patient Medicare AWV questionnaire was not completed prior to this visit.  Cardiac Risk Factors include: advanced age (>78men, >18 women);diabetes mellitus;dyslipidemia;hypertension;male gender;obesity (BMI >30kg/m2)     Objective:    Today's Vitals   10/17/23 1505  Weight: 224 lb (101.6 kg)  Height: 6' (1.829 m)   Body mass index is 30.38 kg/m.     10/17/2023    3:05 PM 10/13/2022    2:43 PM 06/03/2019    2:20 PM 05/12/2016    3:33 PM 02/10/2016    1:43 PM 02/03/2016    3:51 PM  Advanced Directives  Does Patient Have a Medical Advance Directive? No No No No  No  No   Does patient want to make changes to medical advance directive?  No - Patient declined      Would patient like information on creating a medical advance directive? No - Patient  declined No - Patient declined Yes (MAU/Ambulatory/Procedural Areas - Information given)  No - Patient declined  No - Patient declined      Data saved with a previous flowsheet row definition    Current Medications (verified) Outpatient Encounter Medications as of 10/17/2023  Medication Sig   albuterol  (VENTOLIN  HFA) 108 (90 Base) MCG/ACT inhaler Inhale 2 puffs into the lungs every 6 (six) hours as needed for wheezing or shortness of breath.   ALPHA LIPOIC ACID PO Take by mouth.   amLODipine  (NORVASC ) 5 MG tablet Take 1 tablet by mouth once daily   azaTHIOprine (IMURAN) 50 MG tablet Take 200 mg by mouth daily.   azelastine  (ASTELIN ) 0.1 % nasal spray SPRAY TWO SPRAYS IN EACH NOSTRIL TWICE DAILY AS DIRECTED   calcium-vitamin D  (OSCAL-500) 500-400 MG-UNIT tablet Take 1 tablet by mouth daily. 500-600   CHROMIUM PO Take by mouth.   Cranberry-Milk Thistle (LIVER & KIDNEY CLEANSER PO) Take by mouth.   cyclobenzaprine  (FLEXERIL ) 5 MG tablet Take 1 tablet (5 mg total) by mouth 3 (three) times daily as needed for muscle spasms.   diphenhydrAMINE (BENADRYL) 25 MG tablet Take 25 mg by mouth every 6 (six) hours as needed for sleep.    doxycycline  (VIBRA -TABS) 100 MG tablet Take 1 tablet (100 mg total) by mouth 2 (two) times daily.   gabapentin (NEURONTIN) 300 MG capsule Take 1 capsule 3 times a day by oral route.   hydrochlorothiazide  (MICROZIDE ) 12.5 MG capsule Take 1 capsule by mouth daily.   metFORMIN  (GLUCOPHAGE -XR) 500 MG 24 hr tablet Take  1 tablet by mouth once daily with breakfast   Misc Natural Products (HEALTHY LIVER PO) Take by mouth.   Multiple Vitamin (MULTIVITAMIN) tablet Take 1 tablet by mouth daily.   NON FORMULARY Adult stem cell activator   NON FORMULARY PC brain and liver   olmesartan -hydrochlorothiazide  (BENICAR  HCT) 40-25 MG tablet Take 1 tablet by mouth daily.   predniSONE  (DELTASONE ) 2.5 MG tablet    pyridostigmine (MESTINON) 60 MG tablet Take 60 mg by mouth 4 (four) times daily.    ranitidine  (ZANTAC ) 150 MG tablet Take 1 tablet (150 mg total) by mouth 2 (two) times daily.   RESVERATROL PO Take 1 each by mouth daily.   simethicone  (GAS-X) 80 MG chewable tablet Chew 1 tablet (80 mg total) by mouth every 6 (six) hours as needed for flatulence.   tirzepatide  (MOUNJARO ) 5 MG/0.5ML Pen INJECT 1/2 (ONE-HALF) ML  ONCE A WEEK   UNABLE TO FIND Take by mouth.   No facility-administered encounter medications on file as of 10/17/2023.    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 since 1990s   Myasthenia gravis (HCC)    Sinus drainage 02/03/2016   Wears partial dentures    upper    Past Surgical History:  Procedure Laterality Date   LASER ABLATION OF CONDYLOMAS  11/04/2009   LUMBAR SPINE SURGERY  1986   MINOR FULGERATION OF ANAL CONDYLOMA N/A 02/10/2016   Procedure: EXCISION AND FULGURATION OF ANAL CONDYLOMA;  Surgeon: Krystal Russell, MD;  Location:  SUR[facility]ERY CENTER;  Service: General;  Laterality: N/A;   Family History  Problem Relation Age of Onset   Aneurysm Brother 68       ruptured aneurysm   Diabetes Mother    Diabetes Sister    Cancer Sister        pancreatic   Hypertension Sister    Social History   Socioeconomic History   Marital status: Single    Spouse name: Not on file   Number of children: Not on file   Years of education: Not on file   Highest education level: Not on file  Occupational History   Not on file  Tobacco Use   Smoking status: Never   Smokeless tobacco: Never  Substance and Sexual Activity   Alcohol use: Yes    Comment:  occasionally   Drug use: No   Sexual activity: Not Currently  Other Topics Concern   Not on file  Social History Narrative   Not on file   Social Drivers of Health   Financial Resource Strain: Low Risk  (10/17/2023)   Overall Financial Resource Strain (CARDIA)    Difficulty of Paying Living Expenses: Not hard at all  Food Insecurity: No Food Insecurity (10/17/2023)   Hunger Vital Sign    Worried About Running Out of Food in the Last Year: Never true    Ran Out of Food in the Last Year: Never true  Transportation Needs: No Transportation Needs (10/17/2023)   PRAPARE - Administrator, Civil Service (Medical): No    Lack of Transportation (Non-Medical): No  Physical Activity: Inactive (10/17/2023)   Exercise Vital Sign  Days of Exercise per Week: 0 days    Minutes of Exercise per Session: 0 min  Stress: No Stress Concern Present (10/17/2023)   [employer] of Occupational Health - Occupational Stress Questionnaire    Feeling of Stress: Not at all  Social Connections: Moderately Isolated (10/17/2023)   Social Connection and Isolation Panel    Frequency of Communication with Friends and Family: Once a week    Frequency of Social Gatherings with Friends and Family: More than three times a week    Attends Religious Services: More than 4 times per year    Active Member of [employer] or Organizations: No    Attends Engineer, structural: Never    Marital Status: Divorced    Tobacco Counseling Counseling given: No    Clinical Intake:  Pre-visit preparation completed: Yes  Pain : No/denies pain     BMI - recorded: 30.38 Nutritional Status: BMI > 30  Obese Nutritional Risks: None Diabetes: Yes CBG done?: No Did pt. bring in CBG monitor from home?: No  Lab Results  Component Value Date   HGBA1C 10.4 (H) 04/19/2023   HGBA1C 6.9 (H) 08/29/2022   HGBA1C 10.7 (A) 03/03/2022     How often do you need to have someone help you when you read instructions,  pamphlets, or other written materials from your doctor or pharmacy?: 1 - Never  Interpreter Needed?: No  Information entered by :: Verdie Saba, CMA   Activities of Daily Living     10/17/2023    3:08 PM  In your present state of health, do you have any difficulty performing the following activities:  Hearing? 0  Vision? 0  Difficulty concentrating or making decisions? 0  Walking or climbing stairs? 0  Dressing or bathing? 0  Doing errands, shopping? 0  Preparing Food and eating ? N  Using the Toilet? N  In the past six months, have you accidently leaked urine? N  Do you have problems with loss of bowel control? N  Managing your Medications? N  Managing your Finances? N  Housekeeping or managing your Housekeeping? N    Patient Care Team: Norleen Lynwood ORN, MD as PCP - [facility], [facility]  I have updated your Care Teams any recent Medical Services you may have received from other providers in the past year.     Assessment:   This is a routine wellness examination for Miguel Powell.  Hearing/Vision screen Hearing Screening - Comments:: Denies hearing difficulties   Vision Screening - Comments:: Denies vision concerns   Goals Addressed               This Visit's Progress     Weight (lb) < 200 lb (90.7 kg) (pt-stated)   224 lb (101.6 kg)     Patient stated he plans to lose weight and schedule an eye appt w/[facility]       Depression Screen     10/17/2023    3:09 PM 07/11/2023    3:11 PM 04/19/2023    2:48 PM 10/13/2022    2:43 PM 08/29/2022   10:44 AM 03/03/2022    9:42 AM 08/09/2021   10:34 AM  PHQ 2/9 Scores  PHQ - 2 Score 0 0 0 0 0 0 0  PHQ- 9 Score 0      0    Fall Risk     10/17/2023    3:09 PM 07/11/2023    3:19 PM 04/19/2023    3:14  PM 10/13/2022    2:42 PM 08/29/2022   10:44 AM  Fall Risk   Falls in the past year? 0 0 0 0 0  Number falls in past yr: 0 0 0 0 0  Injury with Fall? 0 0 0 0 0  Risk for fall due to : No  Fall Risks No Fall Risks No Fall Risks No Fall Risks No Fall Risks  Follow up Falls evaluation completed;Falls prevention discussed Falls evaluation completed Falls evaluation completed Falls evaluation completed Falls evaluation completed    MEDICARE RISK AT HOME:  Medicare Risk at Home Any stairs in or around the home?: No If so, are there any without handrails?: No Home free of loose throw rugs in walkways, pet beds, electrical cords, etc?: Yes Adequate lighting in your home to reduce risk of falls?: Yes Life alert?: No Use of a cane, walker or w/c?: No Grab bars in the bathroom?: No Shower chair or bench in shower?: No Elevated toilet seat or a handicapped toilet?: No  TIMED UP AND GO:  Was the test performed?  No  Cognitive Function: 6CIT completed        10/17/2023    3:09 PM 10/13/2022    2:43 PM  6CIT Screen  What Year? 0 points 0 points  What month? 0 points 0 points  What time? 0 points 0 points  Count back from 20 0 points 0 points  Months in reverse 0 points 0 points  Repeat phrase 0 points 0 points  Total Score 0 points 0 points    Immunizations Immunization History  Administered Date(s) Administered   PFIZER(Purple Top)SARS-COV-2 Vaccination 08/21/2019, 09/16/2019, 03/16/2020   PPD Test 08/12/2018   Pneumococcal Conjugate-13 08/12/2018   Tdap 10/30/2011    Screening Tests Health Maintenance  Topic Date Due   Zoster Vaccines- Shingrix (1 of 2) Never done   Pneumococcal Vaccine: 50+ Years (2 of 2 - PPSV23, PCV20, or PCV21) 10/07/2018   INFLUENZA VACCINE  Never done   HEMOGLOBIN A1C  10/20/2023   OPHTHALMOLOGY EXAM  01/01/2024   Diabetic kidney evaluation - eGFR measurement  04/18/2024   Diabetic kidney evaluation - Urine ACR  04/18/2024   FOOT EXAM  04/18/2024   Medicare Annual Wellness (AWV)  10/16/2024   Colonoscopy  04/26/2025   Hepatitis C Screening  Completed   HIV Screening  Completed   Hepatitis B Vaccines 19-59 Average Risk  Aged Out    HPV VACCINES  Aged Out   Meningococcal B Vaccine  Aged Out   DTaP/Tdap/Td  Discontinued   COVID-19 Vaccine  Discontinued    Health Maintenance  Health Maintenance Due  Topic Date Due   Zoster Vaccines- Shingrix (1 of 2) Never done   Pneumococcal Vaccine: 50+ Years (2 of 2 - PPSV23, PCV20, or PCV21) 10/07/2018   INFLUENZA VACCINE  Never done   Health Maintenance Items Addressed: 10/17/2023  Additional Screening:  Vision Screening: Recommended annual ophthalmology exams for early detection of glaucoma and other disorders of the eye. Would you like a referral to an eye doctor? No  Patient plans to schedule an appt w/[facility] for a Diabetic eye exam.  Dental Screening: Recommended annual dental exams for proper oral hygiene  Community Resource Referral / Chronic Care Management: CRR required this visit?  No   CCM required this visit?  No   Plan:    I have personally reviewed and noted the following in the patient's chart:   Medical and social history Use of  alcohol, tobacco or illicit drugs  Current medications and supplements including opioid prescriptions. Patient is not currently taking opioid prescriptions. Functional ability and status Nutritional status Physical activity Advanced directives List of other physicians Hospitalizations, surgeries, and ER visits in previous 12 months Vitals Screenings to include cognitive, depression, and falls Referrals and appointments  In addition, I have reviewed and discussed with patient certain preventive protocols, quality metrics, and best practice recommendations. A written personalized care plan for preventive services as well as general preventive health recommendations were provided to patient.   Verdie CHRISTELLA Saba, CMA   10/17/2023   After Visit Summary: (MyChart) Due to this being a telephonic visit, the after visit summary with patients personalized plan was offered to patient via MyChart   Notes: Nothing significant  to report at this time.

## 2023-10-17 NOTE — Patient Instructions (Signed)
 Mr. Gorniak , Thank you for taking time out of your busy schedule to complete your Annual Wellness Visit with me. I enjoyed our conversation and look forward to speaking with you again next year. I, as well as your care team,  appreciate your ongoing commitment to your health goals. Please review the following plan we discussed and let me know if I can assist you in the future. Your Game plan/ To Do List    Referrals: If you haven't heard from the office you've been referred to, please reach out to them at the phone provided.   Follow up Visits: We will see or speak with you next year for your Next Medicare AWV with our clinical staff Have you seen your provider in the last 6 months (3 months if uncontrolled diabetes)? Yes  Clinician Recommendations:  Aim for 30 minutes of exercise or brisk walking, 6-8 glasses of water, and 5 servings of fruits and vegetables each day. Educated and advised on getting the Pneumonia and Shingles vaccines in 2025.      This is a list of the screenings recommended for you:  Health Maintenance  Topic Date Due   Zoster (Shingles) Vaccine (1 of 2) Never done   Pneumococcal Vaccine for age over 72 (2 of 2 - PPSV23, PCV20, or PCV21) 10/07/2018   Flu Shot  Never done   Medicare Annual Wellness Visit  10/13/2023   Hemoglobin A1C  10/20/2023   Eye exam for diabetics  01/01/2024   Yearly kidney function blood test for diabetes  04/18/2024   Yearly kidney health urinalysis for diabetes  04/18/2024   Complete foot exam   04/18/2024   Colon Cancer Screening  04/26/2025   Hepatitis C Screening  Completed   HIV Screening  Completed   Hepatitis B Vaccine  Aged Out   HPV Vaccine  Aged Out   Meningitis B Vaccine  Aged Out   DTaP/Tdap/Td vaccine  Discontinued   COVID-19 Vaccine  Discontinued    Advanced directives: (Declined) Advance directive discussed with you today. Even though you declined this today, please call our office should you change your mind, and we can  give you the proper paperwork for you to fill out. Advance Care Planning is important because it:  [x]  Makes sure you receive the medical care that is consistent with your values, goals, and preferences  [x]  It provides guidance to your family and loved ones and reduces their decisional burden about whether or not they are making the right decisions based on your wishes.  Follow the link provided in your after visit summary or read over the paperwork we have mailed to you to help you started getting your Advance Directives in place. If you need assistance in completing these, please reach out to us  so that we can help you!

## 2023-10-22 ENCOUNTER — Ambulatory Visit: Payer: Self-pay | Admitting: Internal Medicine

## 2023-10-22 ENCOUNTER — Ambulatory Visit: Payer: Medicare (Managed Care) | Admitting: Internal Medicine

## 2023-10-22 ENCOUNTER — Other Ambulatory Visit: Payer: Self-pay | Admitting: Internal Medicine

## 2023-10-22 VITALS — BP 116/78 | HR 90 | Temp 98.0°F | Ht 70.75 in | Wt 220.1 lb

## 2023-10-22 DIAGNOSIS — I10 Essential (primary) hypertension: Secondary | ICD-10-CM

## 2023-10-22 DIAGNOSIS — E538 Deficiency of other specified B group vitamins: Secondary | ICD-10-CM

## 2023-10-22 DIAGNOSIS — H6123 Impacted cerumen, bilateral: Secondary | ICD-10-CM

## 2023-10-22 DIAGNOSIS — E7800 Pure hypercholesterolemia, unspecified: Secondary | ICD-10-CM

## 2023-10-22 DIAGNOSIS — E669 Obesity, unspecified: Secondary | ICD-10-CM

## 2023-10-22 DIAGNOSIS — E559 Vitamin D deficiency, unspecified: Secondary | ICD-10-CM

## 2023-10-22 DIAGNOSIS — Z7984 Long term (current) use of oral hypoglycemic drugs: Secondary | ICD-10-CM | POA: Diagnosis not present

## 2023-10-22 DIAGNOSIS — Z794 Long term (current) use of insulin: Secondary | ICD-10-CM | POA: Diagnosis not present

## 2023-10-22 DIAGNOSIS — E0822 Diabetes mellitus due to underlying condition with diabetic chronic kidney disease: Secondary | ICD-10-CM | POA: Diagnosis not present

## 2023-10-22 DIAGNOSIS — N182 Chronic kidney disease, stage 2 (mild): Secondary | ICD-10-CM | POA: Diagnosis not present

## 2023-10-22 LAB — BASIC METABOLIC PANEL WITH GFR
BUN: 19 mg/dL (ref 6–23)
CO2: 30 meq/L (ref 19–32)
Calcium: 9.5 mg/dL (ref 8.4–10.5)
Chloride: 101 meq/L (ref 96–112)
Creatinine, Ser: 1.3 mg/dL (ref 0.40–1.50)
GFR: 57.86 mL/min — ABNORMAL LOW (ref >60.00)
Glucose, Bld: 88 mg/dL (ref 70–99)
Potassium: 3.1 meq/L — ABNORMAL LOW (ref 3.5–5.1)
Sodium: 140 meq/L (ref 135–145)

## 2023-10-22 LAB — URINALYSIS, ROUTINE W REFLEX MICROSCOPIC
Bilirubin Urine: NEGATIVE
Hgb urine dipstick: NEGATIVE
Ketones, ur: NEGATIVE
Leukocytes,Ua: NEGATIVE
Nitrite: NEGATIVE
RBC / HPF: NONE SEEN (ref 0–?)
Specific Gravity, Urine: <=1.005 — AB (ref 1.000–1.030)
Total Protein, Urine: NEGATIVE
Urine Glucose: NEGATIVE
Urobilinogen, UA: 0.2 (ref 0.0–1.0)
pH: 6 (ref 5.0–8.0)

## 2023-10-22 LAB — HEPATIC FUNCTION PANEL
ALT: 40 U/L (ref 0–53)
AST: 36 U/L (ref 0–37)
Albumin: 4.1 g/dL (ref 3.5–5.2)
Alkaline Phosphatase: 41 U/L (ref 39–117)
Bilirubin, Direct: 0.2 mg/dL (ref 0.0–0.3)
Total Bilirubin: 0.9 mg/dL (ref 0.2–1.2)
Total Protein: 7.1 g/dL (ref 6.0–8.3)

## 2023-10-22 LAB — CBC WITH DIFFERENTIAL/PLATELET
Basophils Absolute: 0 K/uL (ref 0.0–0.1)
Basophils Relative: 0.5 % (ref 0.0–3.0)
Eosinophils Absolute: 0 K/uL (ref 0.0–0.7)
Eosinophils Relative: 1.1 % (ref 0.0–5.0)
HCT: 43.3 % (ref 39.0–52.0)
Hemoglobin: 14.8 g/dL (ref 13.0–17.0)
Lymphocytes Relative: 53.6 % — ABNORMAL HIGH (ref 12.0–46.0)
Lymphs Abs: 1.9 K/uL (ref 0.7–4.0)
MCHC: 34.2 g/dL (ref 30.0–36.0)
MCV: 96.2 fl (ref 78.0–100.0)
Monocytes Absolute: 0.3 K/uL (ref 0.1–1.0)
Monocytes Relative: 9.4 % (ref 3.0–12.0)
Neutro Abs: 1.3 K/uL — ABNORMAL LOW (ref 1.4–7.7)
Neutrophils Relative %: 35.4 % — ABNORMAL LOW (ref 43.0–77.0)
Platelets: 285 K/uL (ref 150.0–400.0)
RBC: 4.5 Mil/uL (ref 4.22–5.81)
RDW: 14.2 % (ref 11.5–15.5)
WBC: 3.6 K/uL — ABNORMAL LOW (ref 4.0–10.5)

## 2023-10-22 LAB — LIPID PANEL
Cholesterol: 171 mg/dL (ref 0–200)
HDL: 59.4 mg/dL (ref >39.00)
LDL Cholesterol: 99 mg/dL (ref 0–99)
NonHDL: 111.8
Total CHOL/HDL Ratio: 3
Triglycerides: 66 mg/dL (ref 0.0–149.0)
VLDL: 13.2 mg/dL (ref 0.0–40.0)

## 2023-10-22 LAB — TSH: TSH: 0.55 u[IU]/mL (ref 0.35–5.50)

## 2023-10-22 LAB — HEMOGLOBIN A1C: Hgb A1c MFr Bld: 6.6 % — ABNORMAL HIGH (ref 4.6–6.5)

## 2023-10-22 LAB — VITAMIN D 25 HYDROXY (VIT D DEFICIENCY, FRACTURES): VITD: 46.75 ng/mL (ref 30.00–100.00)

## 2023-10-22 LAB — VITAMIN B12: Vitamin B-12: 430 pg/mL (ref 211–911)

## 2023-10-22 MED ORDER — POTASSIUM CHLORIDE ER 10 MEQ PO TBCR: 10.0000 meq | EXTENDED_RELEASE_TABLET | Freq: Every day | ORAL | 3 refills | Status: AC

## 2023-10-22 MED ORDER — OLMESARTAN MEDOXOMIL 40 MG PO TABS: 40.0000 mg | ORAL_TABLET | Freq: Every day | ORAL | 3 refills | Status: AC

## 2023-10-22 NOTE — Progress Notes (Addendum)
 Chief Complaint: follow up bilateral cerumen, dm with obesity, htn, ckd 3a, hld, low vit d       HPI:  Miguel Powell is a 63 y.o. male here with muffled hearing bilateral due to wax.  Pt denies chest pain, increased sob or doe, wheezing, orthopnea, PND, increased LE swelling, palpitations, dizziness or syncope.   Pt denies polydipsia, polyuria, or new focal neuro s/s.      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 since 1990s   Myasthenia gravis (HCC)    Sinus drainage 02/03/2016   Wears partial dentures    upper    Past Surgical History:  Procedure Laterality Date   LASER ABLATION OF CONDYLOMAS  11/04/2009   LUMBAR SPINE SURGERY  1986   MINOR FULGERATION OF ANAL CONDYLOMA N/A 02/10/2016   Procedure: EXCISION AND FULGURATION OF ANAL CONDYLOMA;  Surgeon: Krystal Russell, MD;  Location: [facility];  Service: General;  Laterality: N/A;    reports that he has never smoked. He has never used smokeless tobacco. He reports current alcohol use. He reports that he does not use drugs. family history includes Aneurysm (age of onset: 68) in his brother; Cancer in his sister; Diabetes in his mother and sister; Hypertension in his sister. Allergies  Allergen Reactions   Azithromycin  Other (See Comments)    DUE TO MYASTHENIA GRAVIS   Botox [Botulinum Toxin Type A] Other (See Comments)    DUE TO MYASTHENIA GRAVIS   Gentamycin [Gentamicin] Other  (See Comments)    DUE TO MYASTHENIA GRAVIS   Ketek [Telithromycin] Other (See Comments)    DUE TO MYASTHENIA GRAVIS   Levaquin [Levofloxacin In D5w] Other (See Comments)    DUE TO MYASTHENIA GRAVIS   Magnesium-Containing Compounds Other (See Comments)    DUE TO MYASTHENIA GRAVIS   Neomycin Other (See Comments)    DUE TO MYASTHENIA GRAVIS   Penicillins Other (See Comments)    DUE TO MYASTHENIA GRAVIS    Procainamide Other (See Comments)    DUE TO MYASTHENIA GRAVIS   Quinine Derivatives Other (See Comments)    DUE TO MYASTHENIA GRAVIS   Current Outpatient Medications on File Prior to Visit  Medication Sig Dispense Refill   albuterol  (VENTOLIN  HFA) 108 (90 Base) MCG/ACT inhaler Inhale 2 puffs into the lungs every 6 (six) hours as needed for wheezing or shortness of breath. 8 g 1   ALPHA LIPOIC ACID PO Take by mouth.     amLODipine  (NORVASC ) 5 MG tablet Take 1 tablet by mouth once daily 90 tablet 0   azaTHIOprine (IMURAN) 50 MG tablet Take 200 mg by mouth daily.     azelastine  (ASTELIN ) 0.1 % nasal spray SPRAY TWO SPRAYS IN EACH NOSTRIL TWICE DAILY AS DIRECTED 30 mL 5   calcium-vitamin D  (OSCAL-500) 500-400 MG-UNIT tablet Take 1  tablet by mouth daily. 500-600     CHROMIUM PO Take by mouth.     Cranberry-Milk Thistle (LIVER & KIDNEY CLEANSER PO) Take by mouth.     cyclobenzaprine  (FLEXERIL ) 5 MG tablet Take 1 tablet (5 mg total) by mouth 3 (three) times daily as needed for muscle spasms. 60 tablet 2   diphenhydrAMINE (BENADRYL) 25 MG tablet Take 25 mg by mouth every 6 (six) hours as needed for sleep.      gabapentin (NEURONTIN) 300 MG capsule Take 1 capsule 3 times a day by oral route.     hydrochlorothiazide  (MICROZIDE ) 12.5 MG capsule Take 1 capsule by mouth daily.     metFORMIN  (GLUCOPHAGE -XR) 500 MG 24 hr tablet Take 1 tablet by mouth once daily with breakfast 90 tablet 3   Misc Natural Products (HEALTHY LIVER PO) Take by mouth.     Multiple Vitamin (MULTIVITAMIN) tablet Take 1  tablet by mouth daily.     NON FORMULARY Adult stem cell activator     NON FORMULARY PC brain and liver     predniSONE  (DELTASONE ) 2.5 MG tablet      pyridostigmine (MESTINON) 60 MG tablet Take 60 mg by mouth 4 (four) times daily.     ranitidine  (ZANTAC ) 150 MG tablet Take 1 tablet (150 mg total) by mouth 2 (two) times daily. 180 tablet 3   RESVERATROL PO Take 1 each by mouth daily.     simethicone  (GAS-X) 80 MG chewable tablet Chew 1 tablet (80 mg total) by mouth every 6 (six) hours as needed for flatulence. 30 tablet 0   tirzepatide  (MOUNJARO ) 5 MG/0.5ML Pen INJECT 1/2 (ONE-HALF) ML  ONCE A WEEK 6 mL 0   UNABLE TO FIND Take by mouth.     No current facility-administered medications on file prior to visit.        ROS:  All others reviewed and negative.  Objective        PE:  BP 116/78   Pulse 90   Temp 98 F (36.7 C) (Temporal)   Ht 5' 10.75 (1.797 m)   Wt 220 lb 2 oz (99.8 kg)   SpO2 94%   BMI 30.92 kg/m                 Constitutional: Pt appears in NAD               HENT: Head: NCAT.                Right Ear: External ear normal.                 Left Ear: External ear normal. Bilateral cerumen resolved               Eyes: . Pupils are equal, round, and reactive to light. Conjunctivae and EOM are normal               Nose: without d/c or deformity               Neck: Neck supple. Gross normal ROM               Cardiovascular: Normal rate and regular rhythm.                 Pulmonary/Chest: Effort normal and breath sounds without rales or wheezing.                Abd:  Soft, NT, ND, + BS, no organomegaly  Neurological: Pt is alert. At baseline orientation, motor grossly intact               Skin: Skin is warm. No rashes, no other new lesions, LE edema - none               Psychiatric: Pt behavior is normal without agitation   Micro: none  Cardiac tracings I have personally interpreted today:  none  Pertinent Radiological findings (summarize): none   Lab  Results  Component Value Date   WBC 3.6 (L) 10/22/2023   HGB 14.8 10/22/2023   HCT 43.3 10/22/2023   PLT 285.0 10/22/2023   GLUCOSE 88 10/22/2023   CHOL 171 10/22/2023   TRIG 66.0 10/22/2023   HDL 59.40 10/22/2023   LDLCALC 99 10/22/2023   ALT 40 10/22/2023   AST 36 10/22/2023   NA 140 10/22/2023   K 3.1 (L) 10/22/2023   CL 101 10/22/2023   CREATININE 1.30 10/22/2023   BUN 19 10/22/2023   CO2 30 10/22/2023   TSH 0.55 10/22/2023   PSA 1.63 04/19/2023   HGBA1C 6.6 (H) 10/22/2023   MICROALBUR 1.2 04/19/2023   Assessment/Plan:  Pookela Sellin is a 63 y.o. Black or African American [2] male with  has a past medical history of Anal condyloma (01/2016), Arthritis, HLD (hyperlipidemia) (05/03/2018), Hypertension, Myasthenia gravis (HCC), Sinus drainage (02/03/2016), and Wears partial dentures.  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, with long-term current use of insulin (HCC) Lab Results  Component Value Date   HGBA1C 6.6 (H) 10/22/2023   Stable, pt to continue current medical treatment metformin  ER 500 mg - 1 every day, and mounajro 5 mg, and consdier d/c metformin  for if A1c improves with further wt loss   HLD (hyperlipidemia) Lab Results  Component Value Date   LDLCALC 99 10/22/2023   uncontrolled, pt to consider repatha but declines for now, declines statin   Vitamin D  deficiency Last vitamin D  Lab Results  Component Value Date   VD25OH 46.75 10/22/2023   Stable, cont oral replacement   Bilateral impacted  cerumen Resolved with tx today,  to f/u any worsening symptoms or concerns  Followup: Return in about 6 months (around 04/20/2024).  Lynwood Rush, MD 10/26/2023 7:39 PM [facility]

## 2023-10-22 NOTE — Patient Instructions (Addendum)
 Your ears were treated today  Ok to change the Benicar  HCT to Benicar  40 mg only due to the lower BP with wt loss  Please continue all other medications as before, including the mounjaro   Please have the pharmacy call with any other refills you may need.  Please continue your efforts at being more active, low cholesterol diet, and weight control.  Please keep your appointments with your specialists as you may have planned  Please go to the LAB at the blood drawing area for the tests to be done  You will be contacted by phone if any changes need to be made immediately.  Otherwise, you will receive a letter about your results with an explanation, but please check with MyChart first.  Please make an Appointment to return in 6 months, or sooner if needed

## 2023-10-26 ENCOUNTER — Encounter: Payer: Self-pay | Admitting: Internal Medicine

## 2023-10-26 ENCOUNTER — Telehealth: Payer: Self-pay

## 2023-10-26 DIAGNOSIS — H538 Other visual disturbances: Secondary | ICD-10-CM | POA: Diagnosis not present

## 2023-10-26 DIAGNOSIS — G7000 Myasthenia gravis without (acute) exacerbation: Secondary | ICD-10-CM | POA: Diagnosis not present

## 2023-10-26 DIAGNOSIS — Z6829 Body mass index (BMI) 29.0-29.9, adult: Secondary | ICD-10-CM | POA: Diagnosis not present

## 2023-10-26 DIAGNOSIS — H02402 Unspecified ptosis of left eyelid: Secondary | ICD-10-CM | POA: Diagnosis not present

## 2023-10-26 DIAGNOSIS — H6123 Impacted cerumen, bilateral: Secondary | ICD-10-CM | POA: Insufficient documentation

## 2023-10-26 NOTE — Assessment & Plan Note (Signed)
 Lab Results  Component Value Date   CREATININE 1.30 10/22/2023   Stable overall, cont to avoid nephrotoxins

## 2023-10-26 NOTE — Assessment & Plan Note (Signed)
 Last vitamin D  Lab Results  Component Value Date   VD25OH 46.75 10/22/2023   Stable, cont oral replacement

## 2023-10-26 NOTE — Telephone Encounter (Signed)
 Copied from CRM #[id number]. Topic: Clinical - Medication Question >> Oct 25, 2023  4:41 PM Shereese L wrote: Reason for CRM: patient is requesting a script Hydrocod-hom due to a severe cough and sinus drainage and requesting a refill on the medication. Original script was written back in May

## 2023-10-26 NOTE — Assessment & Plan Note (Signed)
 Resolved with tx today,  to f/u any worsening symptoms or concerns

## 2023-10-26 NOTE — Assessment & Plan Note (Signed)
 Lab Results  Component Value Date   LDLCALC 99 10/22/2023   uncontrolled, pt to consider repatha but declines for now, declines statin

## 2023-10-26 NOTE — Assessment & Plan Note (Addendum)
 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

## 2023-10-26 NOTE — Assessment & Plan Note (Addendum)
 BP Readings from Last 3 Encounters:  10/22/23 116/78  07/11/23 138/80  04/19/23 122/80   Low normal, will need to avoid lower bp with further wt loss, ok for pt to change medical treatment from benicar  hct to benicar  40 mg every day only

## 2023-11-01 MED ORDER — HYDROCODONE BIT-HOMATROP MBR 5-1.5 MG/5ML PO SOLN: 5.0000 mL | Freq: Four times a day (QID) | ORAL | 0 refills | Status: DC | PRN

## 2023-11-01 NOTE — Addendum Note (Signed)
 Addended by: NORLEEN LYNWOOD ORN on: 11/01/2023 04:14 PM   Modules accepted: Orders

## 2023-11-01 NOTE — Telephone Encounter (Signed)
Ok done to [facility] 

## 2023-11-02 ENCOUNTER — Telehealth: Payer: Self-pay | Admitting: Internal Medicine

## 2023-11-02 ENCOUNTER — Other Ambulatory Visit: Payer: Self-pay | Admitting: Internal Medicine

## 2023-11-02 MED ORDER — CYCLOBENZAPRINE HCL 5 MG PO TABS: 5.0000 mg | ORAL_TABLET | Freq: Three times a day (TID) | ORAL | 2 refills | Status: DC | PRN

## 2023-11-02 MED ORDER — HYDROCODONE BIT-HOMATROP MBR 5-1.5 MG/5ML PO SOLN: 5.0000 mL | Freq: Four times a day (QID) | ORAL | 0 refills | Status: AC | PRN

## 2023-11-02 NOTE — Telephone Encounter (Signed)
 Copied from CRM #[id number]. Topic: Clinical - Prescription Issue >> Nov 02, 2023  4:41 PM Chiquita SQUIBB wrote: Reason for CRM: Amid from [facility] is calling in regarding the HYDROcodone  bit-homatropine (HYCODAN) 5-1.5 MG/5ML syrup. They are asking why he is prescribed this medication and if it is for a cough it has a 7 day maximum per their policy. Please contact [facility] at [phone]. Patient requested this refill as a urgent request today 09/19.

## 2023-11-02 NOTE — Telephone Encounter (Signed)
 Copied from CRM #[id number]. Topic: Clinical - Medication Refill >> Nov 02, 2023  2:40 PM Mercedes MATSU wrote: Medication:  cyclobenzaprine  (FLEXERIL ) 5 MG tablet  HYDROcodone  bit-homatropine (HYCODAN) 5-1.5 MG/5ML syrup  Has the patient contacted their pharmacy? Yes (Agent: If no, request that the patient contact the pharmacy for the refill. If patient does not wish to contact the pharmacy document the reason why and proceed with request.) (Agent: If yes, when and what did the pharmacy advise?)  This is the patient's preferred pharmacy:  [facility] [address] [facility], [place] - [address] [place] [address] Phone: [phone] Fax: [phone]   Is this the correct pharmacy for this prescription? Yes If no, delete pharmacy and type the correct one.   Has the prescription been filled recently? Yes  Is the patient out of the medication? Yes  Has the patient been seen for an appointment in the last year OR does the patient have an upcoming appointment? Yes  Can we respond through MyChart? Yes  Agent: Please be advised that Rx refills may take up to 3 business days. We ask that you follow-up with your pharmacy.

## 2023-11-05 ENCOUNTER — Telehealth: Payer: Self-pay | Admitting: Radiology

## 2023-11-05 NOTE — Telephone Encounter (Signed)
 Acute persistent cough

## 2023-11-05 NOTE — Telephone Encounter (Signed)
 Copied from CRM #[id number]. Topic: Clinical - Prescription Issue >> Nov 02, 2023  4:41 PM Chiquita SQUIBB wrote: Reason for CRM: Amid from [facility] is calling in regarding the HYDROcodone  bit-homatropine (HYCODAN) 5-1.5 MG/5ML syrup. They are asking why he is prescribed this medication and if it is for a cough it has a 7 day maximum per their policy. Please contact [facility] at [phone]. Patient requested this refill as a urgent request today 09/19. >> Nov 05, 2023  4:12 PM Winona SAUNDERS wrote: Luke from [facility] pharmacy calling regarding the HYDROcodone  bit-homatropine (HYCODAN) 5-1.5 MG/5ML syrup. They are asking why he is prescribed this medication and if it is for a cough it has a 7 day maximum per their policy. Please contact [facility] at [phone]. Patient requested this refill as a urgent request as of 09/19

## 2023-11-06 NOTE — Telephone Encounter (Signed)
 Called and spoke with the pharmacy representative. Apparently there was a distance issue (being that they had a different address for patient than what we had) and the amount. They did have to switch the amount to 140 from 180 to get patient his prescription filled.  Sending over as FYI

## 2023-11-08 ENCOUNTER — Telehealth: Payer: Self-pay

## 2023-11-08 ENCOUNTER — Other Ambulatory Visit (HOSPITAL_COMMUNITY): Payer: Self-pay

## 2023-11-08 MED ORDER — TIZANIDINE HCL 2 MG PO TABS: 2.0000 mg | ORAL_TABLET | Freq: Four times a day (QID) | ORAL | 1 refills | Status: DC | PRN

## 2023-11-08 NOTE — Progress Notes (Signed)
 PRE-PROCEDURE EXAM: Bilateral TM cannot be visualized due to total occlusion/impaction of the ear canal.  PROCEDURE INDICATION: remove wax to visualize ear drum & relieve discomfort  CONSENT:  Verbal     PROCEDURE NOTE:     Bilateral EAR:  I used a metal wax curette under direct vision with an otoscope to free the wax bolus from the ear wall and then successfully removed a small bit of wax. The ear was then irrigated with warm water to remove the remaining wax.  POST- PROCEDURE EXAM: Bilateral TMs successfully visualized and found to have no erythema     The patient tolerated the procedure well.

## 2023-11-08 NOTE — Telephone Encounter (Signed)
 Pharmacy Patient Advocate Encounter   Received notification from Pt Calls Messages that prior authorization for Cyclobenzaprine  5mg  tabs is required/requested.   Insurance verification completed.   The patient is insured through [employer] .   Per test claim: PA required; PA started via CoverMyMeds. KEY [id number] . Waiting for clinical questions to populate.

## 2023-11-08 NOTE — Telephone Encounter (Signed)
 The cyclobenzaprine  is not covered by the plan. They do cover Tizanidine  caps and tabs.   I tried a test claim for the 2mg  tabs and it came back as a paid claim.  Would you like to change or complete the PA?

## 2023-11-08 NOTE — Telephone Encounter (Signed)
[place] for change to tizanidine - done erx

## 2023-11-08 NOTE — Telephone Encounter (Signed)
 Copied from CRM #[id number]. Topic: Clinical - Medication Prior Auth >> Nov 07, 2023  4:05 PM Burnard DEL wrote: Reason for CRM: Pharmacy notified patient that a PA is needed for medication cyclobenzaprine  (FLEXERIL ) 5 MG tablet

## 2023-11-09 ENCOUNTER — Other Ambulatory Visit (HOSPITAL_COMMUNITY): Payer: Self-pay

## 2023-11-09 ENCOUNTER — Telehealth: Payer: Self-pay | Admitting: Radiology

## 2023-11-09 NOTE — Telephone Encounter (Signed)
 Order for cyclobenzaprine  changed to Tizanidine 

## 2023-11-09 NOTE — Telephone Encounter (Signed)
 Copied from CRM [id number]. Topic: Clinical - Medication Question >> Nov 09, 2023  1:03 PM Paige D wrote: Reason for CRM: Pt is calling stating he seen Dr. Norleen 9/8 and he was going to give him a new script to Benacar 40 mg pt would like a call back in regards to this to further discuss medications. Pt is out of the medication and needs it filled today. He states he has no medication left.  Benacar 40 MG filled today please.

## 2023-11-12 NOTE — Telephone Encounter (Signed)
 This has since been resolved in a separate telephone note

## 2023-11-16 ENCOUNTER — Telehealth: Payer: Self-pay

## 2023-11-16 NOTE — Telephone Encounter (Signed)
 Copied from CRM [id number]. Topic: General - Other >> Nov 15, 2023  4:12 PM Delon DASEN wrote: Reason for CRM: returning call from office- advised of prescription at pharmacy- patient needs the generic cyclobenzaprine  - unable to take name brand- pharmacy needs doctor approval

## 2023-11-18 ENCOUNTER — Other Ambulatory Visit: Payer: Self-pay | Admitting: Internal Medicine

## 2023-11-19 ENCOUNTER — Other Ambulatory Visit: Payer: Self-pay

## 2023-11-19 NOTE — Telephone Encounter (Signed)
 Refill was sent on 10/22/2023.

## 2023-11-21 ENCOUNTER — Telehealth: Payer: Self-pay

## 2023-11-21 ENCOUNTER — Other Ambulatory Visit (HOSPITAL_COMMUNITY): Payer: Self-pay

## 2023-11-21 MED ORDER — CYCLOBENZAPRINE HCL 5 MG PO TABS: 5.0000 mg | ORAL_TABLET | Freq: Three times a day (TID) | ORAL | 1 refills | Status: AC | PRN

## 2023-11-21 NOTE — Telephone Encounter (Signed)
 Yes, but bananas are not helpful enough to meaningfully and reliably keep the potassium in the normal range.  So the potassium med would still be needed.   thanks

## 2023-11-21 NOTE — Telephone Encounter (Signed)
 Called and left voice mail

## 2023-11-21 NOTE — Telephone Encounter (Signed)
 Tizanidine  was ordered first, but then pt wanted the flexeril  just a couple of days later, so I did as requested.   Ok to PA for flexeril  as it appears he could not tolerate the tizanidine .   thanks

## 2023-11-21 NOTE — Telephone Encounter (Signed)
 Copied from CRM [id number]. Topic: Clinical - Medication Question >> Nov 21, 2023  1:54 PM Mesmerise C wrote: Reason for CRM: Patient stated he he know why he was his potassium was low when he was taking his olmesartan  it says to eat a banana and he wasn't doing that, patient inquiring if he still needs to keep taking the potassium pills becuase he's been eating a banana with the olemsartan again or continue taking the potassium so it's not low

## 2023-11-21 NOTE — Telephone Encounter (Signed)
 Ok for change tizanidine  to fleseril 5 mg tid prn

## 2023-11-21 NOTE — Telephone Encounter (Signed)
 Pharmacy Patient Advocate Encounter   Received notification from CoverMyMeds that prior authorization for Cyclobenzaprine  HCl 5MG  tablets  is required/requested.   Insurance verification completed.   The patient is insured through [employer].   Per test claim:  Tizanidine  caps see encounter from  11/08/2023 is preferred by the insurance.  If suggested medication is appropriate, Please send in a new RX and discontinue this one. If TIZANIDINE  CAPS IS  not, PLEASE ADVISE AS TO WHY IT'S NOT APPROPRIATE SO THAT WE MAY REQUEST A PRIOR AUTHORIZATION. Please note, some preferred medications may still require a PA.  If the suggested medications have not been trialed and there are no contraindications to their use, the PA will not be submitted, as it will not be approved.  I CAN PROCEED WITH PA PLEASE LET ME KNOW

## 2023-11-22 ENCOUNTER — Other Ambulatory Visit (HOSPITAL_COMMUNITY): Payer: Self-pay

## 2023-11-22 NOTE — Telephone Encounter (Signed)
 Pharmacy Patient Advocate Encounter  Received notification from [facility] [employer]  that Prior Authorization for CYCLOBENZAPRINE  HCL 5MG  TABLETS   has been APPROVED from 10/23/2023 to 11/21/2024. Ran test claim, Copay is $0.00. This test claim was processed through [facility]- copay amounts may vary at other pharmacies due to pharmacy/plan contracts, or as the patient moves through the different stages of their insurance plan.   PA #/Case ID/Reference #: [id number]

## 2023-11-23 NOTE — Telephone Encounter (Signed)
 Called and let Pt know

## 2023-12-01 ENCOUNTER — Other Ambulatory Visit: Payer: Self-pay | Admitting: Internal Medicine

## 2023-12-03 ENCOUNTER — Other Ambulatory Visit: Payer: Self-pay

## 2023-12-04 DIAGNOSIS — E119 Type 2 diabetes mellitus without complications: Secondary | ICD-10-CM | POA: Diagnosis not present

## 2023-12-04 DIAGNOSIS — Z713 Dietary counseling and surveillance: Secondary | ICD-10-CM | POA: Diagnosis not present

## 2024-02-05 DIAGNOSIS — Z860100 Personal history of colon polyps, unspecified: Secondary | ICD-10-CM | POA: Diagnosis not present

## 2024-02-05 DIAGNOSIS — R140 Abdominal distension (gaseous): Secondary | ICD-10-CM | POA: Diagnosis not present

## 2024-02-05 DIAGNOSIS — K760 Fatty (change of) liver, not elsewhere classified: Secondary | ICD-10-CM | POA: Diagnosis not present

## 2024-02-05 DIAGNOSIS — K5903 Drug induced constipation: Secondary | ICD-10-CM | POA: Diagnosis not present

## 2024-02-05 DIAGNOSIS — K5730 Diverticulosis of large intestine without perforation or abscess without bleeding: Secondary | ICD-10-CM | POA: Diagnosis not present

## 2024-02-19 ENCOUNTER — Other Ambulatory Visit: Payer: Self-pay

## 2024-02-19 ENCOUNTER — Other Ambulatory Visit: Payer: Self-pay | Admitting: Internal Medicine

## 2024-02-26 ENCOUNTER — Other Ambulatory Visit: Payer: Self-pay | Admitting: Internal Medicine

## 2024-03-03 ENCOUNTER — Telehealth: Payer: Self-pay

## 2024-03-03 ENCOUNTER — Other Ambulatory Visit: Payer: Self-pay | Admitting: Internal Medicine

## 2024-03-03 NOTE — Telephone Encounter (Signed)
 Oh no, this is the usual treatment to start with, and usually works very well.   I would not advise anything further but to try this.   thanks

## 2024-03-03 NOTE — Telephone Encounter (Signed)
 Copied from CRM [id number]. Topic: Clinical - Prescription Issue >> Mar 03, 2024  2:15 PM Rea ORN wrote: Reason for CRM: Pt stated  MOUNJARO  has been causing constipation. He went to his gastroenterologist and they advises that he take colace and miralax. Pt wanted to ask PCP is there anything else he should be taking or doing?  Please call back to advise,  [phone]

## 2024-03-06 NOTE — Telephone Encounter (Signed)
"  Called and left detailed voicemail  "

## 2024-04-21 ENCOUNTER — Ambulatory Visit: Payer: Medicare (Managed Care) | Admitting: Internal Medicine

## 2024-10-21 ENCOUNTER — Ambulatory Visit: Payer: Medicare (Managed Care)
